# Patient Record
Sex: Male | Born: 1935
Health system: Southern US, Community
[De-identification: ages and names within clinical notes are randomized; demographics above are authoritative.]

## PROBLEM LIST (undated history)

## (undated) DIAGNOSIS — F329 Major depressive disorder, single episode, unspecified: Secondary | ICD-10-CM

## (undated) DIAGNOSIS — K635 Polyp of colon: Secondary | ICD-10-CM

## (undated) DIAGNOSIS — I10 Essential (primary) hypertension: Secondary | ICD-10-CM

## (undated) DIAGNOSIS — T7840XA Allergy, unspecified, initial encounter: Secondary | ICD-10-CM

## (undated) DIAGNOSIS — B019 Varicella without complication: Secondary | ICD-10-CM

## (undated) DIAGNOSIS — M1990 Unspecified osteoarthritis, unspecified site: Secondary | ICD-10-CM

## (undated) DIAGNOSIS — I499 Cardiac arrhythmia, unspecified: Secondary | ICD-10-CM

## (undated) DIAGNOSIS — E079 Disorder of thyroid, unspecified: Secondary | ICD-10-CM

## (undated) DIAGNOSIS — K5792 Diverticulitis of intestine, part unspecified, without perforation or abscess without bleeding: Secondary | ICD-10-CM

## (undated) DIAGNOSIS — F32A Depression, unspecified: Secondary | ICD-10-CM

## (undated) HISTORY — DX: Varicella without complication: B01.9

## (undated) HISTORY — DX: Essential (primary) hypertension: I10

## (undated) HISTORY — DX: Major depressive disorder, single episode, unspecified: F32.9

## (undated) HISTORY — DX: Depression, unspecified: F32.A

## (undated) HISTORY — DX: Unspecified osteoarthritis, unspecified site: M19.90

## (undated) HISTORY — DX: Polyp of colon: K63.5

## (undated) HISTORY — DX: Diverticulitis of intestine, part unspecified, without perforation or abscess without bleeding: K57.92

## (undated) HISTORY — DX: Disorder of thyroid, unspecified: E07.9

## (undated) HISTORY — DX: Cardiac arrhythmia, unspecified: I49.9

## (undated) HISTORY — DX: Allergy, unspecified, initial encounter: T78.40XA

---

## 2006-09-21 HISTORY — PX: TOTAL HIP ARTHROPLASTY: SHX124

## 2010-09-01 HISTORY — PX: OTHER SURGICAL HISTORY: SHX169

## 2010-09-11 HISTORY — PX: SPINE SURGERY: SHX786

## 2011-09-13 DIAGNOSIS — I10 Essential (primary) hypertension: Secondary | ICD-10-CM | POA: Diagnosis not present

## 2011-09-14 DIAGNOSIS — R39198 Other difficulties with micturition: Secondary | ICD-10-CM | POA: Diagnosis not present

## 2011-09-14 DIAGNOSIS — N35919 Unspecified urethral stricture, male, unspecified site: Secondary | ICD-10-CM | POA: Diagnosis not present

## 2011-09-14 DIAGNOSIS — N320 Bladder-neck obstruction: Secondary | ICD-10-CM | POA: Diagnosis not present

## 2011-10-05 DIAGNOSIS — M47817 Spondylosis without myelopathy or radiculopathy, lumbosacral region: Secondary | ICD-10-CM | POA: Diagnosis not present

## 2011-10-11 DIAGNOSIS — Z7982 Long term (current) use of aspirin: Secondary | ICD-10-CM | POA: Diagnosis not present

## 2011-10-11 DIAGNOSIS — E785 Hyperlipidemia, unspecified: Secondary | ICD-10-CM | POA: Diagnosis not present

## 2011-10-11 DIAGNOSIS — Z87891 Personal history of nicotine dependence: Secondary | ICD-10-CM | POA: Diagnosis not present

## 2011-10-11 DIAGNOSIS — E039 Hypothyroidism, unspecified: Secondary | ICD-10-CM | POA: Diagnosis not present

## 2011-10-11 DIAGNOSIS — M47817 Spondylosis without myelopathy or radiculopathy, lumbosacral region: Secondary | ICD-10-CM | POA: Diagnosis not present

## 2011-10-11 DIAGNOSIS — J309 Allergic rhinitis, unspecified: Secondary | ICD-10-CM | POA: Diagnosis not present

## 2011-10-11 DIAGNOSIS — I10 Essential (primary) hypertension: Secondary | ICD-10-CM | POA: Diagnosis not present

## 2011-10-20 DIAGNOSIS — Z0000 Encounter for general adult medical examination without abnormal findings: Secondary | ICD-10-CM | POA: Diagnosis not present

## 2011-10-20 DIAGNOSIS — I10 Essential (primary) hypertension: Secondary | ICD-10-CM | POA: Diagnosis not present

## 2011-10-20 DIAGNOSIS — Z1212 Encounter for screening for malignant neoplasm of rectum: Secondary | ICD-10-CM | POA: Diagnosis not present

## 2011-10-20 DIAGNOSIS — E039 Hypothyroidism, unspecified: Secondary | ICD-10-CM | POA: Diagnosis not present

## 2011-10-24 DIAGNOSIS — E039 Hypothyroidism, unspecified: Secondary | ICD-10-CM | POA: Diagnosis not present

## 2011-10-24 DIAGNOSIS — I10 Essential (primary) hypertension: Secondary | ICD-10-CM | POA: Diagnosis not present

## 2011-10-24 DIAGNOSIS — M545 Low back pain, unspecified: Secondary | ICD-10-CM | POA: Diagnosis not present

## 2011-10-24 DIAGNOSIS — Z1212 Encounter for screening for malignant neoplasm of rectum: Secondary | ICD-10-CM | POA: Diagnosis not present

## 2011-10-24 DIAGNOSIS — Z86010 Personal history of colonic polyps: Secondary | ICD-10-CM | POA: Diagnosis not present

## 2011-10-24 DIAGNOSIS — Z0000 Encounter for general adult medical examination without abnormal findings: Secondary | ICD-10-CM | POA: Diagnosis not present

## 2011-10-24 DIAGNOSIS — G479 Sleep disorder, unspecified: Secondary | ICD-10-CM | POA: Diagnosis not present

## 2011-11-21 DIAGNOSIS — R809 Proteinuria, unspecified: Secondary | ICD-10-CM | POA: Diagnosis not present

## 2011-11-21 DIAGNOSIS — R799 Abnormal finding of blood chemistry, unspecified: Secondary | ICD-10-CM | POA: Diagnosis not present

## 2011-12-07 DIAGNOSIS — M47817 Spondylosis without myelopathy or radiculopathy, lumbosacral region: Secondary | ICD-10-CM | POA: Diagnosis not present

## 2011-12-12 DIAGNOSIS — Z0000 Encounter for general adult medical examination without abnormal findings: Secondary | ICD-10-CM | POA: Diagnosis not present

## 2011-12-12 DIAGNOSIS — Z86010 Personal history of colonic polyps: Secondary | ICD-10-CM | POA: Diagnosis not present

## 2011-12-13 DIAGNOSIS — M47817 Spondylosis without myelopathy or radiculopathy, lumbosacral region: Secondary | ICD-10-CM | POA: Diagnosis not present

## 2011-12-13 DIAGNOSIS — Z87891 Personal history of nicotine dependence: Secondary | ICD-10-CM | POA: Diagnosis not present

## 2011-12-13 DIAGNOSIS — I10 Essential (primary) hypertension: Secondary | ICD-10-CM | POA: Diagnosis not present

## 2011-12-13 DIAGNOSIS — Z7982 Long term (current) use of aspirin: Secondary | ICD-10-CM | POA: Diagnosis not present

## 2011-12-13 DIAGNOSIS — E039 Hypothyroidism, unspecified: Secondary | ICD-10-CM | POA: Diagnosis not present

## 2012-01-16 DIAGNOSIS — M461 Sacroiliitis, not elsewhere classified: Secondary | ICD-10-CM | POA: Diagnosis not present

## 2012-01-16 DIAGNOSIS — M47817 Spondylosis without myelopathy or radiculopathy, lumbosacral region: Secondary | ICD-10-CM | POA: Diagnosis not present

## 2012-03-05 DIAGNOSIS — Z23 Encounter for immunization: Secondary | ICD-10-CM | POA: Diagnosis not present

## 2012-03-15 DIAGNOSIS — I10 Essential (primary) hypertension: Secondary | ICD-10-CM | POA: Diagnosis not present

## 2012-04-13 DIAGNOSIS — T169XXA Foreign body in ear, unspecified ear, initial encounter: Secondary | ICD-10-CM | POA: Diagnosis not present

## 2012-10-14 DIAGNOSIS — M545 Low back pain: Secondary | ICD-10-CM | POA: Diagnosis not present

## 2012-10-14 DIAGNOSIS — H43819 Vitreous degeneration, unspecified eye: Secondary | ICD-10-CM | POA: Diagnosis not present

## 2012-10-31 DIAGNOSIS — R350 Frequency of micturition: Secondary | ICD-10-CM | POA: Diagnosis not present

## 2012-10-31 DIAGNOSIS — N401 Enlarged prostate with lower urinary tract symptoms: Secondary | ICD-10-CM | POA: Diagnosis not present

## 2012-10-31 DIAGNOSIS — R39198 Other difficulties with micturition: Secondary | ICD-10-CM | POA: Diagnosis not present

## 2012-10-31 DIAGNOSIS — R351 Nocturia: Secondary | ICD-10-CM | POA: Diagnosis not present

## 2012-11-07 DIAGNOSIS — J3089 Other allergic rhinitis: Secondary | ICD-10-CM | POA: Diagnosis not present

## 2012-11-08 DIAGNOSIS — I10 Essential (primary) hypertension: Secondary | ICD-10-CM | POA: Diagnosis not present

## 2012-12-25 DIAGNOSIS — N138 Other obstructive and reflux uropathy: Secondary | ICD-10-CM | POA: Diagnosis not present

## 2012-12-25 DIAGNOSIS — R350 Frequency of micturition: Secondary | ICD-10-CM | POA: Diagnosis not present

## 2012-12-25 DIAGNOSIS — R39198 Other difficulties with micturition: Secondary | ICD-10-CM | POA: Diagnosis not present

## 2012-12-25 DIAGNOSIS — N401 Enlarged prostate with lower urinary tract symptoms: Secondary | ICD-10-CM | POA: Diagnosis not present

## 2012-12-25 DIAGNOSIS — R3919 Other difficulties with micturition: Secondary | ICD-10-CM | POA: Diagnosis not present

## 2012-12-25 DIAGNOSIS — D400 Neoplasm of uncertain behavior of prostate: Secondary | ICD-10-CM | POA: Diagnosis not present

## 2013-01-23 DIAGNOSIS — Z1389 Encounter for screening for other disorder: Secondary | ICD-10-CM | POA: Diagnosis not present

## 2013-01-23 DIAGNOSIS — N138 Other obstructive and reflux uropathy: Secondary | ICD-10-CM | POA: Diagnosis not present

## 2013-01-23 DIAGNOSIS — N401 Enlarged prostate with lower urinary tract symptoms: Secondary | ICD-10-CM | POA: Diagnosis not present

## 2013-01-23 DIAGNOSIS — I10 Essential (primary) hypertension: Secondary | ICD-10-CM | POA: Diagnosis not present

## 2013-01-23 DIAGNOSIS — Z01810 Encounter for preprocedural cardiovascular examination: Secondary | ICD-10-CM | POA: Diagnosis not present

## 2013-01-23 DIAGNOSIS — Z01818 Encounter for other preprocedural examination: Secondary | ICD-10-CM | POA: Diagnosis not present

## 2013-01-28 DIAGNOSIS — M545 Low back pain: Secondary | ICD-10-CM | POA: Diagnosis not present

## 2013-01-28 DIAGNOSIS — R32 Unspecified urinary incontinence: Secondary | ICD-10-CM | POA: Diagnosis not present

## 2013-01-28 DIAGNOSIS — Z87891 Personal history of nicotine dependence: Secondary | ICD-10-CM | POA: Diagnosis not present

## 2013-01-28 DIAGNOSIS — Z7982 Long term (current) use of aspirin: Secondary | ICD-10-CM | POA: Diagnosis not present

## 2013-01-28 DIAGNOSIS — N401 Enlarged prostate with lower urinary tract symptoms: Secondary | ICD-10-CM | POA: Diagnosis not present

## 2013-01-28 DIAGNOSIS — E039 Hypothyroidism, unspecified: Secondary | ICD-10-CM | POA: Diagnosis not present

## 2013-01-28 DIAGNOSIS — I10 Essential (primary) hypertension: Secondary | ICD-10-CM | POA: Diagnosis not present

## 2013-01-28 HISTORY — PX: PROSTATE SURGERY: SHX751

## 2013-02-06 DIAGNOSIS — Z0000 Encounter for general adult medical examination without abnormal findings: Secondary | ICD-10-CM | POA: Diagnosis not present

## 2013-02-06 DIAGNOSIS — Z23 Encounter for immunization: Secondary | ICD-10-CM | POA: Diagnosis not present

## 2013-02-06 DIAGNOSIS — K5732 Diverticulitis of large intestine without perforation or abscess without bleeding: Secondary | ICD-10-CM | POA: Diagnosis not present

## 2013-02-06 DIAGNOSIS — N138 Other obstructive and reflux uropathy: Secondary | ICD-10-CM | POA: Diagnosis not present

## 2013-02-06 DIAGNOSIS — I10 Essential (primary) hypertension: Secondary | ICD-10-CM | POA: Diagnosis not present

## 2013-02-06 DIAGNOSIS — E785 Hyperlipidemia, unspecified: Secondary | ICD-10-CM | POA: Diagnosis not present

## 2013-02-06 DIAGNOSIS — E039 Hypothyroidism, unspecified: Secondary | ICD-10-CM | POA: Diagnosis not present

## 2013-02-06 DIAGNOSIS — M47812 Spondylosis without myelopathy or radiculopathy, cervical region: Secondary | ICD-10-CM | POA: Diagnosis not present

## 2013-06-03 ENCOUNTER — Other Ambulatory Visit: Payer: Self-pay | Admitting: *Deleted

## 2013-06-03 ENCOUNTER — Ambulatory Visit (INDEPENDENT_AMBULATORY_CARE_PROVIDER_SITE_OTHER): Payer: Medicare Other | Admitting: Family Medicine

## 2013-06-03 ENCOUNTER — Encounter: Payer: Self-pay | Admitting: Family Medicine

## 2013-06-03 VITALS — BP 110/64 | Temp 98.2°F | Ht 64.25 in | Wt 176.0 lb

## 2013-06-03 DIAGNOSIS — N400 Enlarged prostate without lower urinary tract symptoms: Secondary | ICD-10-CM | POA: Insufficient documentation

## 2013-06-03 DIAGNOSIS — E039 Hypothyroidism, unspecified: Secondary | ICD-10-CM

## 2013-06-03 DIAGNOSIS — G8929 Other chronic pain: Secondary | ICD-10-CM | POA: Diagnosis not present

## 2013-06-03 DIAGNOSIS — M5416 Radiculopathy, lumbar region: Principal | ICD-10-CM

## 2013-06-03 DIAGNOSIS — I10 Essential (primary) hypertension: Secondary | ICD-10-CM | POA: Diagnosis not present

## 2013-06-03 DIAGNOSIS — G4700 Insomnia, unspecified: Secondary | ICD-10-CM

## 2013-06-03 DIAGNOSIS — IMO0002 Reserved for concepts with insufficient information to code with codable children: Secondary | ICD-10-CM | POA: Diagnosis not present

## 2013-06-03 LAB — TSH: TSH: 0.1 u[IU]/mL — ABNORMAL LOW (ref 0.35–5.50)

## 2013-06-03 MED ORDER — LEVOTHYROXINE SODIUM 175 MCG PO TABS: 175.0000 ug | ORAL_TABLET | Freq: Every day | ORAL | Status: DC

## 2013-06-03 MED ORDER — TAMSULOSIN HCL 0.4 MG PO CAPS: ORAL_CAPSULE | ORAL | Status: DC

## 2013-06-03 MED ORDER — VERAPAMIL HCL 120 MG PO TABS: ORAL_TABLET | ORAL | Status: DC

## 2013-06-03 MED ORDER — TRIAMTERENE-HCTZ 37.5-25 MG PO TABS: ORAL_TABLET | ORAL | Status: DC

## 2013-06-03 MED ORDER — HYDROCODONE-ACETAMINOPHEN 5-325 MG PO TABS: ORAL_TABLET | ORAL | Status: DC

## 2013-06-03 MED ORDER — ZOLPIDEM TARTRATE 5 MG PO TABS: ORAL_TABLET | ORAL | Status: DC

## 2013-06-03 NOTE — Patient Instructions (Signed)
Ambien 5 mg.......... one half tab at bedtime when necessary for sleep  Decrease the daily and to 120 mg daily  Check your blood pressure daily at home  Return in one month for followup with all your blood pressure readings and the device  Vicodin........ one half tab in the morning and a full tab at bedtime  Joining the [employer] and begin a water walking program  Continue the Synthroid Maxide and Flomax as you currently are doing.  We would also be happy to set you up a consult in the pain clinic with Dr. Nicholaus Bloom to see if there is anything else that can be done to help relieve your pain

## 2013-06-03 NOTE — Progress Notes (Signed)
   Subjective:    Patient ID: Ronald Strong, male    DOB: 1921-02-11, 80 y.o.   MRN: [id number]  HPI Ronald Strong is a 80 year old married male nonsmoker retired Social research officer, government from [place] who stays here in the winter with his daughter Ronald Strong who comes in today to establish since his can be living here 6 months of the year  He takes Synthroid one daily for hypothyroidism, Maxide verapamil for hypertension BP actually too low 110/64, Ambien 5 mg each bedtime for sleep, Flomax 0.4 for BPH, and Vicodin 5 mg one at bedtime for chronic back pain. He's had spinal surgery in the past and has been told he cannot have any more operations. He states he's unable to walk because of the pain. Is able to sleep at night because he takes a pain pill at bedtime but is not take any pain medication during the day.  He's always been very healthy. He had a right hip replacement in 2008, spinal surgery 2012, and laser reduction of the prostate in September 2014.   Review of Systems Review of systems otherwise negative except for exercise is limited by pain    Objective:   Physical Exam  Well-developed well-nourished male in no acute distress vital signs stable is afebrile BP today is low 110/64      Assessment & Plan:  Chronic back pain,,,,,,,, referred to the pain clinic for further evaluation,,,,,,,, take a half a Vicodin prior to exercise in the morning  Hypothyroidism continue Synthroid  Hypertension...Marland KitchenMarland KitchenMarland Kitchen BP too low cut breath no down to 120 mg daily  BPH Flomax 0.4 daily  Status post prostatectomy via laser  Status post right hip replacement  Status post spinal decompression

## 2013-06-04 ENCOUNTER — Telehealth: Payer: Self-pay | Admitting: Family Medicine

## 2013-06-04 NOTE — Telephone Encounter (Signed)
Relevant patient education assigned to patient using Emmi. ° °

## 2013-06-05 ENCOUNTER — Other Ambulatory Visit: Payer: Self-pay | Admitting: *Deleted

## 2013-06-05 DIAGNOSIS — E039 Hypothyroidism, unspecified: Secondary | ICD-10-CM

## 2013-06-05 MED ORDER — LEVOTHYROXINE SODIUM 125 MCG PO TABS: 125.0000 ug | ORAL_TABLET | Freq: Every day | ORAL | Status: DC

## 2013-07-01 ENCOUNTER — Ambulatory Visit (INDEPENDENT_AMBULATORY_CARE_PROVIDER_SITE_OTHER): Payer: Medicare Other | Admitting: Family Medicine

## 2013-07-01 ENCOUNTER — Encounter: Payer: Self-pay | Admitting: Family Medicine

## 2013-07-01 VITALS — BP 110/68 | Temp 97.6°F | Wt 176.0 lb

## 2013-07-01 DIAGNOSIS — I10 Essential (primary) hypertension: Secondary | ICD-10-CM | POA: Diagnosis not present

## 2013-07-01 MED ORDER — VERAPAMIL HCL 80 MG PO TABS: ORAL_TABLET | ORAL | Status: DC

## 2013-07-01 NOTE — Patient Instructions (Signed)
Decrease your verapamil to 80 mg daily ........... try taking it at bedtime to avoid the episodes of low blood pressure  Check your blood pressure daily in the morning  Return in one month for followup sooner if any problem

## 2013-07-01 NOTE — Progress Notes (Signed)
   Subjective:    Patient ID: Ronald Strong, male    DOB: 07/25/1920, 80 y.o.   MRN: [id number]  HPI Ronald Strong is a delightful 80 year old male who comes in today for followup of hypertension  He was on Verapamil SR we decreased the dose to 120 mg daily because his blood pressure was too low he was lightheaded when he stood up.. 120 mg of verapamil daily his BP is still low 110/68 pulse 70 and regular he was also given Maxide 25 mg to take daily because of the fluid retention from the verapamil.  He says he is no longer lightheaded when he stands up   Review of Systems    review of systems otherwise negative Objective:   Physical Exam Well-developed well-nourished male no acute distress vital signs stable he is afebrile BP 110/68 pulse 70 and regular       Assessment & Plan:  Hypertension BP still too low decrease Verapamil SR outlined

## 2013-07-02 ENCOUNTER — Telehealth: Payer: Self-pay | Admitting: Family Medicine

## 2013-07-02 NOTE — Telephone Encounter (Signed)
Relevant patient education assigned to patient using Emmi. ° °

## 2013-07-11 ENCOUNTER — Telehealth: Payer: Self-pay | Admitting: Family Medicine

## 2013-07-11 DIAGNOSIS — G8929 Other chronic pain: Secondary | ICD-10-CM

## 2013-07-11 DIAGNOSIS — M5416 Radiculopathy, lumbar region: Principal | ICD-10-CM

## 2013-07-11 DIAGNOSIS — G4700 Insomnia, unspecified: Secondary | ICD-10-CM

## 2013-07-11 NOTE — Telephone Encounter (Signed)
Pt requesting nurse to call him back regarding two of his prescriptions pt stated he did not want to get into details with scheduler.

## 2013-07-14 ENCOUNTER — Other Ambulatory Visit (INDEPENDENT_AMBULATORY_CARE_PROVIDER_SITE_OTHER): Payer: Medicare Other

## 2013-07-14 DIAGNOSIS — E039 Hypothyroidism, unspecified: Secondary | ICD-10-CM

## 2013-07-14 LAB — TSH: TSH: 2 u[IU]/mL (ref 0.35–5.50)

## 2013-07-14 MED ORDER — ZOLPIDEM TARTRATE 5 MG PO TABS: ORAL_TABLET | ORAL | Status: DC

## 2013-07-14 MED ORDER — HYDROCODONE-ACETAMINOPHEN 5-325 MG PO TABS: ORAL_TABLET | ORAL | Status: DC

## 2013-07-15 ENCOUNTER — Other Ambulatory Visit: Payer: No Typology Code available for payment source

## 2013-08-05 ENCOUNTER — Encounter: Payer: Self-pay | Admitting: Family Medicine

## 2013-08-05 ENCOUNTER — Ambulatory Visit (INDEPENDENT_AMBULATORY_CARE_PROVIDER_SITE_OTHER): Payer: Medicare Other | Admitting: Family Medicine

## 2013-08-05 VITALS — BP 130/64 | HR 88 | Temp 98.4°F | Ht 64.25 in | Wt 175.0 lb

## 2013-08-05 DIAGNOSIS — R000 Tachycardia, unspecified: Secondary | ICD-10-CM

## 2013-08-05 NOTE — Progress Notes (Signed)
   Subjective:    Patient ID: Ronald Strong, male    DOB: 05-01-1921, 80 y.o.   MRN: [id number]  HPI Ronald Strong is a 80 year old male married nonsmoker who comes in today for evaluation of elevated heart rate  He's followed at the [facility] clinic and is here in the wintertime with his daughter  They had placed him on verapamil 120 mg in the morning and 80 mg at bedtime to control episodes of PAT. He says most recently his heart rate is been in the 90s. In reviewing his medication he's not sure she's taking his evening dose of verapamil.  He's also taking sedating antihistamine however it says he makes him hyper and he chases that with 5 mg of Ambien. I don't think at his age of 62 that he should be taking Ambien   Review of Systems Review of systems otherwise negative    Objective:   Physical Exam  Well-developed well nourished male no acute distress vital signs stable is afebrile BP today 130/64 pulse is 70 and regular      Assessment & Plan:  History of PAT currently asymptomatic with normal heart rate..........Marland Kitchen plan see orders

## 2013-08-05 NOTE — Progress Notes (Signed)
Pre visit review using our clinic review tool, if applicable. No additional management support is needed unless otherwise documented below in the visit note. 

## 2013-08-05 NOTE — Patient Instructions (Signed)
We will call you and an arch review your medications  I would  take plain Claritin 10 mg in the morning. Stop the Zyrtec and the Ambien

## 2013-08-25 ENCOUNTER — Ambulatory Visit (INDEPENDENT_AMBULATORY_CARE_PROVIDER_SITE_OTHER): Payer: Medicare Other | Admitting: Family Medicine

## 2013-08-25 ENCOUNTER — Encounter: Payer: Self-pay | Admitting: Family Medicine

## 2013-08-25 VITALS — BP 130/78 | Temp 98.5°F | Wt 176.0 lb

## 2013-08-25 DIAGNOSIS — R000 Tachycardia, unspecified: Secondary | ICD-10-CM

## 2013-08-25 NOTE — Progress Notes (Signed)
   Subjective:    Patient ID: Ronald Strong, male    DOB: [birth year], 79 y.o.   MRN: [id number]  HPI Ronald Strong is a 80 year old male who comes in today for followup of hypertension and rapid heart rate  He had been on 120 mg of verapamil the morning and 70 mg at bedtime however his blood pressure was too low and he was lightheaded when he stood up. We therefore discontinued the nighttime dose. He's currently on 120 mg in the morning. BP 130/80 no more spells of lightheadedness with change in position pulse 80 and regular no episodes of rapid heart rate   Review of Systems Negative    Objective:   Physical Exam  Well-developed well-nourished male no acute distress vital signs stable is afebrile BP 130/80 pulse 80 regular      Assessment & Plan:  Hypertension and rapid heart rate episodic,,,,,,,,,

## 2013-08-25 NOTE — Patient Instructions (Signed)
Continue current medications  Follow-up when necessary 

## 2013-08-25 NOTE — Progress Notes (Signed)
Pre visit review using our clinic review tool, if applicable. No additional management support is needed unless otherwise documented below in the visit note. 

## 2013-09-15 ENCOUNTER — Other Ambulatory Visit: Payer: Self-pay | Admitting: *Deleted

## 2013-09-15 DIAGNOSIS — M5416 Radiculopathy, lumbar region: Principal | ICD-10-CM

## 2013-09-15 DIAGNOSIS — G8929 Other chronic pain: Secondary | ICD-10-CM

## 2013-09-15 MED ORDER — HYDROCODONE-ACETAMINOPHEN 5-325 MG PO TABS: ORAL_TABLET | ORAL | Status: DC

## 2013-09-19 DIAGNOSIS — I10 Essential (primary) hypertension: Secondary | ICD-10-CM | POA: Diagnosis not present

## 2013-09-29 ENCOUNTER — Other Ambulatory Visit: Payer: Self-pay | Admitting: Family Medicine

## 2013-09-29 DIAGNOSIS — M5416 Radiculopathy, lumbar region: Principal | ICD-10-CM

## 2013-09-29 DIAGNOSIS — G8929 Other chronic pain: Secondary | ICD-10-CM

## 2013-10-13 ENCOUNTER — Encounter: Payer: Self-pay | Admitting: Physical Medicine & Rehabilitation

## 2013-10-13 ENCOUNTER — Other Ambulatory Visit: Payer: Self-pay | Admitting: Family Medicine

## 2013-12-01 ENCOUNTER — Other Ambulatory Visit: Payer: Self-pay | Admitting: Family Medicine

## 2013-12-03 ENCOUNTER — Telehealth: Payer: Self-pay | Admitting: Family Medicine

## 2013-12-03 DIAGNOSIS — G8929 Other chronic pain: Secondary | ICD-10-CM

## 2013-12-03 DIAGNOSIS — M5416 Radiculopathy, lumbar region: Principal | ICD-10-CM

## 2013-12-03 NOTE — Telephone Encounter (Signed)
Pt request refill of the following: HYDROcodone-acetaminophen (NORCO/VICODIN) 5-325 MG per tablet ° ° °Phamacy: °

## 2013-12-04 MED ORDER — HYDROCODONE-ACETAMINOPHEN 5-325 MG PO TABS: ORAL_TABLET | ORAL | Status: DC

## 2013-12-04 NOTE — Telephone Encounter (Signed)
Rx ready for pick up and patient is aware 

## 2013-12-08 ENCOUNTER — Ambulatory Visit: Payer: Medicare Other | Admitting: Physical Medicine & Rehabilitation

## 2013-12-08 ENCOUNTER — Encounter: Payer: Medicare Other | Attending: Physical Medicine & Rehabilitation

## 2013-12-16 ENCOUNTER — Ambulatory Visit (INDEPENDENT_AMBULATORY_CARE_PROVIDER_SITE_OTHER): Payer: Medicare Other | Admitting: Family Medicine

## 2013-12-16 ENCOUNTER — Encounter: Payer: Self-pay | Admitting: Family Medicine

## 2013-12-16 VITALS — BP 120/70 | HR 86 | Temp 98.8°F

## 2013-12-16 DIAGNOSIS — B9789 Other viral agents as the cause of diseases classified elsewhere: Principal | ICD-10-CM

## 2013-12-16 DIAGNOSIS — J069 Acute upper respiratory infection, unspecified: Secondary | ICD-10-CM | POA: Diagnosis not present

## 2013-12-16 MED ORDER — HYDROCODONE-HOMATROPINE 5-1.5 MG/5ML PO SYRP: 5.0000 mL | ORAL_SOLUTION | Freq: Three times a day (TID) | ORAL | Status: DC | PRN

## 2013-12-16 NOTE — Progress Notes (Signed)
   Subjective:    Patient ID: Ronald Strong, male    DOB: [birth year], 80 y.o.   MRN: [id number]  HPI [name] is a 80 year old male who comes in today with a seven-day history of a cold  He said head congestion sore throat and cough. No fever chills review of systems otherwise negative.  He says he is prone to pneumonia   Review of Systems    review of systems negative Objective:   Physical Exam  Well-developed and nourished male no acute distress vital signs stable he is afebrile HEENT negative neck was supple no adenopathy lungs are clear      Assessment & Plan:

## 2013-12-16 NOTE — Progress Notes (Signed)
Pre visit review using our clinic review tool, if applicable. No additional management support is needed unless otherwise documented below in the visit note. 

## 2013-12-16 NOTE — Patient Instructions (Signed)
Drink lots of water  Hydromet 1/2-1 teaspoon twice daily. For cough  If you begin to run fever call immediately

## 2013-12-17 ENCOUNTER — Other Ambulatory Visit: Payer: Self-pay | Admitting: Family Medicine

## 2013-12-31 ENCOUNTER — Other Ambulatory Visit: Payer: Self-pay | Admitting: Family Medicine

## 2014-01-09 ENCOUNTER — Encounter: Payer: Medicare Other | Attending: Physical Medicine & Rehabilitation

## 2014-01-09 ENCOUNTER — Encounter: Payer: Self-pay | Admitting: Physical Medicine & Rehabilitation

## 2014-01-09 ENCOUNTER — Ambulatory Visit (HOSPITAL_BASED_OUTPATIENT_CLINIC_OR_DEPARTMENT_OTHER): Payer: Medicare Other | Admitting: Physical Medicine & Rehabilitation

## 2014-01-09 VITALS — BP 125/63 | HR 93 | Resp 14 | Ht 65.0 in | Wt 171.0 lb

## 2014-01-09 DIAGNOSIS — M545 Low back pain, unspecified: Secondary | ICD-10-CM | POA: Diagnosis present

## 2014-01-09 DIAGNOSIS — I10 Essential (primary) hypertension: Secondary | ICD-10-CM | POA: Diagnosis not present

## 2014-01-09 DIAGNOSIS — M961 Postlaminectomy syndrome, not elsewhere classified: Secondary | ICD-10-CM | POA: Insufficient documentation

## 2014-01-09 DIAGNOSIS — M4150 Other secondary scoliosis, site unspecified: Secondary | ICD-10-CM | POA: Diagnosis not present

## 2014-01-09 DIAGNOSIS — M4130 Thoracogenic scoliosis, site unspecified: Secondary | ICD-10-CM | POA: Diagnosis not present

## 2014-01-09 DIAGNOSIS — M4155 Other secondary scoliosis, thoracolumbar region: Secondary | ICD-10-CM

## 2014-01-09 MED ORDER — TRAMADOL HCL 50 MG PO TABS: 50.0000 mg | ORAL_TABLET | Freq: Two times a day (BID) | ORAL | Status: DC

## 2014-01-09 NOTE — Progress Notes (Signed)
Subjective:    Patient ID: Ronald Strong, male    DOB: 02-20-1921, 80 y.o.   MRN: [id number]  HPI CC Low back pain (almost forever) PCP advised pt to be evaluated  Low back pain increasing over the last year. History of lumbar decompression 12/13/2011, ?L4-5, has been on hydrocodone since the surgery. Just takes 1 tablet at bedtime  Surgery help for approximately 5 months. Pain then returned. Tried lumbar injections which were not helpful and also tried radio frequency ablation which was not helpful. Had acupuncture in [place] which seemed to be helpful but tried some in [place] which was not   PSH:  In addition to above, BPH treated with laser debridement in 01/28/2013    Pain Inventory Average Pain 8 Pain Right Now 8 My pain is sharp, burning and aching  In the last 24 hours, has pain interfered with the following? General activity 8 Relation with others 7 Enjoyment of life 9 What TIME of day is your pain at its worst? all Sleep (in general) Fair  Pain is worse with: walking, bending and standing Pain improves with: rest, heat/ice and medication Relief from Meds: 6  Mobility walk without assistance how many minutes can you walk? 20 ability to climb steps?  yes do you drive?  yes  Function retired Do you have any goals in this area?  yes  Neuro/Psych numbness trouble walking  Prior Studies Any changes since last visit?  no  Physicians involved in your care Any changes since last visit?  no Primary care Stevie Kern   Family History  Problem Relation Age of Onset  . Arthritis Other   . Heart disease Other   . Hypertension Other    History   Social History  . Marital Status: Married    Spouse Name: N/A    Number of Children: N/A  . Years of Education: N/A   Social History Main Topics  . Smoking status: Former Smoker    Quit date: 06/04/1983  . Smokeless tobacco: None  . Alcohol Use: Yes  . Drug Use: No  . Sexual Activity: None   Other  Topics Concern  . None   Social History Narrative  . None   Past Surgical History  Procedure Laterality Date  . Total hip arthroplasty  09/21/06    right  . Other surgical history  09/01/10    spine decompression  . Spine surgery  09/11/10  . Prostate surgery  01/28/13    laser   Past Medical History  Diagnosis Date  . Arthritis   . Depression   . Chicken pox   . Diverticulitis   . Allergy   . Arrhythmia   . Hypertension   . Colon polyps   . Thyroid disease    BP 125/63  Pulse 93  Resp 14  Ht 5\' 5"  (1.651 m)  Wt 171 lb (77.565 kg)  BMI 28.46 kg/m2  SpO2 96%  Opioid Risk Score: 0 Fall Risk Score: Moderate Fall Risk (6-13 points) (educated and given handout on fall prevention in the home) Review of Systems  Musculoskeletal: Positive for gait problem.  Neurological: Positive for numbness.  All other systems reviewed and are negative.      Objective:   Physical Exam  Nursing note and vitals reviewed. Constitutional: He is oriented to person, place, and time. He appears well-developed and well-nourished.  HENT:  Head: Normocephalic and atraumatic.  Eyes: Conjunctivae and EOM are normal. Pupils are equal, round, and reactive to light.  Neurological: He is alert and oriented to person, place, and time. He has normal strength and normal reflexes. Gait abnormal.  Kyphotic gait  Psychiatric: He has a normal mood and affect.    Kyphosis Prominent L2 and L3 spinous process Right convex scoliosis lower thoracic and upper lumbar Lumbar range of motion limited less than 25% extension lateral bending and rotation approximately 25-50% flexion 5/5 strength bilateral deltoid, bicep, tricep, grip, hip flexor, knee extensors, ankle dorsiflexor and plantar flex     Assessment & Plan:  1. Lumbar postlaminectomy syndrome with thoracolumbar degenerative scoliosis Has been fairly well controlled under low dose hydrocodone. He may actually be able to obtain adequate relief from  tramadol twice a day. We'll try this. If this is not helpful move up to Tylenol 3. No need for spine injection at the current time  We discussed physical therapy which she does not wish to pursue. Has had this in the past. We discussed exercise program, recommend stationary bicycling which he would consider

## 2014-01-09 NOTE — Patient Instructions (Signed)
If tramadol is not helpful by itself he may take the hydrocodone at night

## 2014-02-11 ENCOUNTER — Ambulatory Visit (INDEPENDENT_AMBULATORY_CARE_PROVIDER_SITE_OTHER): Payer: Medicare Other

## 2014-02-11 DIAGNOSIS — Z23 Encounter for immunization: Secondary | ICD-10-CM | POA: Diagnosis not present

## 2014-02-16 ENCOUNTER — Encounter: Payer: Self-pay | Admitting: Physical Medicine & Rehabilitation

## 2014-02-16 ENCOUNTER — Ambulatory Visit (HOSPITAL_BASED_OUTPATIENT_CLINIC_OR_DEPARTMENT_OTHER): Payer: Medicare Other | Admitting: Physical Medicine & Rehabilitation

## 2014-02-16 ENCOUNTER — Encounter: Payer: Medicare Other | Attending: Physical Medicine & Rehabilitation

## 2014-02-16 VITALS — BP 107/61 | HR 87 | Resp 14 | Wt 168.4 lb

## 2014-02-16 DIAGNOSIS — M961 Postlaminectomy syndrome, not elsewhere classified: Secondary | ICD-10-CM | POA: Diagnosis not present

## 2014-02-16 MED ORDER — TRAMADOL HCL 50 MG PO TABS: 50.0000 mg | ORAL_TABLET | Freq: Three times a day (TID) | ORAL | Status: DC

## 2014-02-16 NOTE — Patient Instructions (Addendum)
[facility] Address: [address], [place], [place] [address] [phone] Hours: Open today  8:00 am - 8:00 pm 

## 2014-02-16 NOTE — Progress Notes (Signed)
Subjective:    Patient ID: Ronald Strong, male    DOB: 09/05/1920, 80 y.o.   MRN: [id number]  HPI CC  Low back pain  Takes tramadol  Sleeps better with tramadol but awakens twice at night  Feels like around noon or afternoon tramadol worse off. Inquiring about a third tablet.  Patient with previous good results with acupuncture. Had better results in [place] been here in [place]. Looking for another potential referral to acupuncture.  Patient without new issues. Only occasionally has pain down the right leg but this is infrequent. Questions about spinal cord stimulation. We did discuss this as being mainly helpful for radicular type pain.  Pain Inventory Average Pain 6 Pain Right Now 9 My pain is sharp and burning  In the last 24 hours, has pain interfered with the following? General activity 9 Relation with others 9 Enjoyment of life 10 What TIME of day is your pain at its worst? daytime and evening Sleep (in general) Fair  Pain is worse with: walking, bending and standing Pain improves with: rest and medication Relief from Meds: 6  Mobility walk without assistance how many minutes can you walk? 5 ability to climb steps?  yes do you drive?  yes  Function retired  Neuro/Psych trouble walking depression  Prior Studies Any changes since last visit?  no  Physicians involved in your care Any changes since last visit?  no   Family History  Problem Relation Age of Onset  . Arthritis Other   . Heart disease Other   . Hypertension Other    History   Social History  . Marital Status: Married    Spouse Name: N/A    Number of Children: N/A  . Years of Education: N/A   Social History Main Topics  . Smoking status: Former Smoker    Quit date: 06/04/1983  . Smokeless tobacco: None  . Alcohol Use: Yes  . Drug Use: No  . Sexual Activity: None   Other Topics Concern  . None   Social History Narrative  . None   Past Surgical History  Procedure  Laterality Date  . Total hip arthroplasty  09/21/06    right  . Other surgical history  09/01/10    spine decompression  . Spine surgery  09/11/10  . Prostate surgery  01/28/13    laser   Past Medical History  Diagnosis Date  . Arthritis   . Depression   . Chicken pox   . Diverticulitis   . Allergy   . Arrhythmia   . Hypertension   . Colon polyps   . Thyroid disease    BP 107/61  Pulse 87  Resp 14  Wt 168 lb 6.4 oz (76.386 kg)  SpO2 93%  Opioid Risk Score:   Fall Risk Score: Moderate Fall Risk (6-13 points) (previously educated and given handout)  Review of Systems  Musculoskeletal: Positive for gait problem.  Psychiatric/Behavioral: Positive for dysphoric mood.  All other systems reviewed and are negative.      Objective:   Physical Exam  Nursing note and vitals reviewed. Constitutional: He is oriented to person, place, and time. He appears well-developed and well-nourished.  Musculoskeletal:       Lumbar back: He exhibits no swelling and no spasm.  No pain with lumbar range of motion except with extension. No tenderness to palpation in the lumbar paraspinal muscles. Kyphotic deformity  Neurological: He is alert and oriented to person, place, and time.  Psychiatric: He has a  normal mood and affect.   Motor strength is 5/5 bilateral hip flexor knee extensor ankle dorsiflexor and plantar flexor       Assessment & Plan:   1. Lumbar postlaminectomy syndrome status post lumbar decompression 12/13/2011. Chronic postoperative pain mainly axial which did not respond to medial branch blocks or radiofrequency Previous good results with acupuncture Referral to: [facility] Address: [address], [place], [place] [address] [phone]  Benefiting from tramadol as well. No sig side effects except some urinary frequency

## 2014-02-24 ENCOUNTER — Ambulatory Visit: Payer: Medicare Other | Admitting: Physical Medicine & Rehabilitation

## 2014-03-23 ENCOUNTER — Ambulatory Visit: Payer: Medicare Other

## 2014-03-31 ENCOUNTER — Telehealth: Payer: Self-pay | Admitting: *Deleted

## 2014-03-31 ENCOUNTER — Ambulatory Visit: Payer: Medicare Other | Attending: Physical Medicine & Rehabilitation | Admitting: Physical Therapy

## 2014-03-31 DIAGNOSIS — M545 Low back pain, unspecified: Secondary | ICD-10-CM

## 2014-03-31 DIAGNOSIS — R269 Unspecified abnormalities of gait and mobility: Secondary | ICD-10-CM | POA: Insufficient documentation

## 2014-03-31 NOTE — Telephone Encounter (Signed)
appts made and printed...td 

## 2014-03-31 NOTE — Therapy (Signed)
Physical Therapy Evaluation  Patient Details  Name: Ronald Strong MRN: [id number] Date of Birth: February 15, 1921  Encounter Date: 03/31/2014      PT End of Session - 03/31/14 1411    Visit Number 1   Number of Visits 8   Date for PT Re-Evaluation 05/30/14   PT Start Time 1330   PT Stop Time 1405   PT Time Calculation (min) 35 min   Activity Tolerance Patient tolerated treatment well   Behavior During Therapy [facility] for tasks assessed/performed      Past Medical History  Diagnosis Date  . Arthritis   . Depression   . Chicken pox   . Diverticulitis   . Allergy   . Arrhythmia   . Hypertension   . Colon polyps   . Thyroid disease     Past Surgical History  Procedure Laterality Date  . Total hip arthroplasty  09/21/06    right  . Other surgical history  09/01/10    spine decompression  . Spine surgery  09/11/10  . Prostate surgery  01/28/13    laser    There were no vitals taken for this visit.  Visit Diagnosis:  Bilateral low back pain without sciatica - Plan: PT plan of care cert/re-cert  Abnormality of gait - Plan: PT plan of care cert/re-cert      Subjective Assessment - 03/31/14 1334    Symptoms Pt is a 80 y/o male who presents to OPPT s/p lumbar decompression about 4 years ago.  Pt reports pain improved initially however returned and reports difficulty walking erect.     Pertinent History lumbar decompression   Limitations Walking   How long can you walk comfortably? 30 yards   Patient Stated Goals walk standing up straight, improve mobility   Currently in Pain? Yes  currently none sitting   Pain Score 9    Pain Location Back   Pain Orientation Lower;Right;Left   Pain Descriptors / Indicators Sharp;Aching   Pain Type Chronic pain   Pain Onset More than a month ago   Pain Frequency Intermittent   Aggravating Factors  walking   Pain Relieving Factors lying down          [facility] PT Assessment - 03/31/14 1337    Assessment   Medical Diagnosis low back pain   Onset Date 03/31/10   Next MD Visit Jan 2016   Prior Therapy PT, accupuncture (pt reports accupuncture at [facility] provided a "few hours" relief.   Precautions   Precautions None   Restrictions   Weight Bearing Restrictions No   Balance Screen   Has the patient fallen in the past 6 months No   Has the patient had a decrease in activity level because of a fear of falling?  Yes   Is the patient reluctant to leave their home because of a fear of falling?  No   Home Environment   Living Enviornment Private residence   Living Arrangements Spouse/significant other;Children   Available Help at Discharge Family;Available 24 hours/day   Type of Home House   Home Access Stairs to enter   Entrance Stairs-Number of Steps 5   Entrance Stairs-Rails Can reach both   Home Layout Two level;Bed/bath upstairs   Alternate Level Stairs-Number of Steps 20   Alternate Level Stairs-Rails Left   Prior Function   Level of Independence Independent with gait;Independent with transfers;Independent with basic ADLs   Vocation Retired   Electronics engineer, some cooking, housework   Cognition   Overall  Cognitive Status Within Functional Limits for tasks assessed   Posture/Postural Control   Posture/Postural Control Postural limitations   Postural Limitations Rounded Shoulders;Forward head;Decreased lumbar lordosis;Increased thoracic kyphosis;Right pelvic obliquity;Flexed trunk   AROM   Lumbar Flexion 40   Lumbar Extension 8   Lumbar - Right Side Bend 15   Lumbar - Left Side Bend 12   Strength   Overall Strength Comments suspect hip ext weakness due to decreased hip ext with gait   Right Hip Flexion 3+/5   Right Hip ABduction 3/5   Left Hip Flexion 4/5   Left Hip ABduction 3+/5   Right Knee Flexion 4/5   Right Knee Extension 4/5   Left Knee Flexion 4/5   Left Knee Extension 4/5   Right Ankle Dorsiflexion 5/5   Left Ankle Dorsiflexion 5/5   Flexibility   Soft Tissue Assessment /Muscle Lenght  yes  bil hip flex tightness in sidelying noted   Hamstrings bil hamstring tightness with SLR   ITB bil ITB tightness with Ober's test   Ambulation/Gait   Ambulation/Gait Yes   Ambulation/Gait Assistance 5: Supervision   Ambulation/Gait Assistance Details increased DOE with ambulation: O2 sats 99%   Ambulation Distance (Feet) 100 Feet   Assistive device None   Gait Pattern Trendelenburg;Lateral hip instability;Decreased trunk rotation;Trunk flexed   Gait velocity 2.69 ft/sec  12.20 sec            PT Education - 19-Apr-2014 1411    Education provided No            PT Long Term Goals - Apr 19, 2014 1413    PT LONG TERM GOAL #1   Title independent with HEP (05/12/14)   Time 6   Period Weeks   Status New   PT LONG TERM GOAL #2   Title report pain no great than 6/10 with walking (05/12/14)   Time 6   Period Weeks   Status New   PT LONG TERM GOAL #3   Title improve gait velocity to > 2.9 ft/sec for improved functional mobility (05/12/14)   Time 6   Period Weeks   Status New   PT LONG TERM GOAL #4   Title improve lumbar ROM by 5 degrees all motions for improved flexibility (05/12/14)   Time 6   Period Weeks   Status New          Plan - 2014/04/19 1411    Clinical Impression Statement Pt presents to OPPT with low back pain and difficulty with ambulation.  Presents with limited ROM and hip tightness and weakness.  Will benefit from PT to maximize function and decrease pain   Pt will benefit from skilled therapeutic intervention in order to improve on the following deficits Abnormal gait;Impaired flexibility;Pain;Postural dysfunction;Improper body mechanics;Decreased mobility;Decreased endurance;Decreased range of motion;Decreased strength;Difficulty walking;Decreased balance   Rehab Potential Good   PT Frequency 2x / week   PT Duration 6 weeks  plan to see 4 weeks total (may need to see over 6 weeks due to holidays)   PT Treatment/Interventions ADLs/Self Care Home  Management;Electrical Stimulation;Gait training;Therapeutic exercise;Patient/family education;Balance training;Stair training;Functional mobility training;Neuromuscular re-education;Manual techniques;Passive range of motion;Dry needling;Therapeutic activities;Cryotherapy;Ultrasound   PT Next Visit Plan HEP for flexibility and core/hip strengthening   Consulted and Agree with Plan of Care Patient          G-Codes - 04-19-2014 1415    Functional Assessment Tool Used clinical judgement: lumbar ROM limited flexion, ext, sidebending; pain up to 9/10   Functional Limitation Mobility:  Walking and moving around   Mobility: Walking and Moving Around Current Status [phone]) At least 40 percent but less than 60 percent impaired, limited or restricted   Mobility: Walking and Moving Around Goal Status [phone]) At least 20 percent but less than 40 percent impaired, limited or restricted      Problem List Patient Active Problem List   Diagnosis Date Noted  . Postlaminectomy syndrome, lumbar region 01/09/2014  . Other secondary scoliosis, thoracolumbar region 01/09/2014  . Viral URI with cough 12/16/2013  . Rapid heart rate 08/05/2013  . Chronic radicular low back pain 06/03/2013  . Unspecified hypothyroidism 06/03/2013  . Accelerated hypertension 06/03/2013  . Insomnia 06/03/2013  . BPH without urinary obstruction 06/03/2013                                           Laureen Abrahams, PT, DPT 03/31/2014 2:20 PM [address] [phone] (office) [phone] (fax)

## 2014-04-03 ENCOUNTER — Other Ambulatory Visit: Payer: Self-pay | Admitting: Family Medicine

## 2014-04-07 ENCOUNTER — Ambulatory Visit: Payer: Medicare Other | Admitting: Rehabilitation

## 2014-04-07 DIAGNOSIS — R269 Unspecified abnormalities of gait and mobility: Secondary | ICD-10-CM

## 2014-04-07 DIAGNOSIS — M545 Low back pain, unspecified: Secondary | ICD-10-CM

## 2014-04-07 NOTE — Therapy (Signed)
[facility]patient [facility] [address] [place], [place], [address] Phone: [phone]   Fax:  [phone]  Physical Therapy Treatment  Patient Details  Name: Jacaden Forbush MRN: [id number] Date of Birth: [birth year]  Encounter Date: 04/07/2014      PT End of Session - 04/07/14 1415    Visit Number 2   Number of Visits 8   Date for PT Re-Evaluation 05/30/14   PT Start Time 0130   PT Stop Time 0215   PT Time Calculation (min) 45 min      Past Medical History  Diagnosis Date  . Arthritis   . Depression   . Chicken pox   . Diverticulitis   . Allergy   . Arrhythmia   . Hypertension   . Colon polyps   . Thyroid disease     Past Surgical History  Procedure Laterality Date  . Total hip arthroplasty  09/21/06    right  . Other surgical history  09/01/10    spine decompression  . Spine surgery  09/11/10  . Prostate surgery  01/28/13    laser    There were no vitals taken for this visit.  Visit Diagnosis:  Bilateral low back pain without sciatica  Abnormality of gait      Subjective Assessment - 04/07/14 1341    Symptoms pt reports pain has worsened in last week, he is afraid he will not be able to walk. His pain is high with walking   Pertinent History lumbar decompression   Currently in Pain? Yes   Pain Score 7    Pain Location Back   Pain Orientation Lower   Pain Descriptors / Indicators Burning;Sharp   Pain Type Chronic pain   Aggravating Factors  walking   Pain Relieving Factors lying down            [facility] Adult PT Treatment/Exercise - 04/07/14 1347    Lumbar Exercises: Stretches   Active Hamstring Stretch 30 seconds;3 reps   Single Knee to Chest Stretch 3 reps;30 seconds   Hip Flexor Stretch 60 seconds;2 reps  bilateral   ITB Stretch 30 seconds;1 rep  side lying with knees bent   Piriformis Stretch 30 seconds;2 reps   Lumbar Exercises: Supine   Bridge 10 reps          PT Education - 04/07/14 1415    Education  provided Yes   Education Details HEP   Person(s) Educated Patient   Methods Explanation;Handout   Comprehension Verbalized understanding              Plan - 04/07/14 1415    Clinical Impression Statement No increased pain after treatment today   PT Next Visit Plan Review HEP add core, hip strength        Problem List Patient Active Problem List   Diagnosis Date Noted  . Postlaminectomy syndrome, lumbar region 01/09/2014  . Other secondary scoliosis, thoracolumbar region 01/09/2014  . Viral URI with cough 12/16/2013  . Rapid heart rate 08/05/2013  . Chronic radicular low back pain 06/03/2013  . Unspecified hypothyroidism 06/03/2013  . Accelerated hypertension 06/03/2013  . Insomnia 06/03/2013  . BPH without urinary obstruction 06/03/2013    Dorene Ar, PTA 04/07/2014, 2:17 PM

## 2014-04-07 NOTE — Patient Instructions (Signed)
   HIP: Flexors - Supine   Lie on edge of surface. Place leg off the surface, allow knee to bend. Bring other knee toward chest. Hold __2 minutes each side , 2 times per day   Copyright  VHI. All rights reserved.  Piriformis (Supine)  JUST CROSS LEG AND PRESS KNEE AWAY GENTLY  Cross legs, right on top. Gently pull other knee toward chest until stretch is felt in buttock/hip of top leg. Hold _30___ seconds. Repeat ___3_ times per set.  Do __2__ sessions per day.  http://orth.exer.us/676    Copyright  VHI. All rights reserved.    Hamstring Step 1   Straighten left knee. Keep knee level with other knee or on bolster. Hold30___ seconds. Relax knee by returning foot to start. Repeat _3__ times. Each leg Copyright  VHI. All rights reserved.   Copyright  VHI. All rights reserved.  Flexors, Supine Bridge   Lie supine, feet shoulder-width apart. Lift hips toward ceiling. Hold __5_ seconds. Repeat __10_ times per session. Do _2__ sessions per day.

## 2014-04-09 ENCOUNTER — Ambulatory Visit: Payer: Medicare Other | Admitting: Physical Therapy

## 2014-04-09 DIAGNOSIS — R269 Unspecified abnormalities of gait and mobility: Secondary | ICD-10-CM

## 2014-04-09 DIAGNOSIS — M545 Low back pain, unspecified: Secondary | ICD-10-CM

## 2014-04-09 NOTE — Therapy (Signed)
Outpatient Rehabilitation [facility] [address] [place], [place], [address] Phone: [phone]   Fax:  [phone]  Physical Therapy Treatment  Patient Details  Name: Ronald Strong MRN: [id number] Date of Birth: [birth year]  Encounter Date: 04/09/2014      PT End of Session - 04/09/14 1457    Visit Number 3   Number of Visits 8   Date for PT Re-Evaluation 05/30/14   PT Start Time 1884   PT Stop Time 1456   PT Time Calculation (min) 41 min   Activity Tolerance Patient tolerated treatment well   Behavior During Therapy [facility] for tasks assessed/performed      Past Medical History  Diagnosis Date  . Arthritis   . Depression   . Chicken pox   . Diverticulitis   . Allergy   . Arrhythmia   . Hypertension   . Colon polyps   . Thyroid disease     Past Surgical History  Procedure Laterality Date  . Total hip arthroplasty  09/21/06    right  . Other surgical history  09/01/10    spine decompression  . Spine surgery  09/11/10  . Prostate surgery  01/28/13    laser    There were no vitals taken for this visit.  Visit Diagnosis:  Bilateral low back pain without sciatica  Abnormality of gait      Subjective Assessment - 04/09/14 1420    Symptoms feels like exercises are improving; still having pain   Pertinent History lumbar decompression   Limitations Walking   How long can you walk comfortably? 30 yards   Patient Stated Goals walk standing up straight, improve mobility   Currently in Pain? Yes   Pain Score 7    Pain Location Back   Pain Orientation Lower   Pain Descriptors / Indicators Burning;Sharp   Pain Type Chronic pain   Pain Onset More than a month ago   Pain Frequency Intermittent   Aggravating Factors  walking            [facility] Adult PT Treatment/Exercise - 04/09/14 1435    Lumbar Exercises: Stretches   Active Hamstring Stretch 30 seconds;3 reps   Single Knee to Chest Stretch 2 reps;30 seconds  maintained 90 degrees hip flex RLE due  to hx THA   Hip Flexor Stretch 1 rep  2 min   Piriformis Stretch 30 seconds;2 reps   Lumbar Exercises: Aerobic   Stationary Bike NuStep Level 4 x 5 min, 4 extremities   Lumbar Exercises: Supine   Ab Set 10 reps;5 seconds   Bridge 10 reps;5 seconds   Lumbar Exercises: Sidelying   Clam 15 reps          PT Education - 04/09/14 1457    Education provided No            PT Long Term Goals - 04/09/14 1458    PT LONG TERM GOAL #1   Title independent with HEP (05/12/14)   Time 6   Period Weeks   Status On-going   PT LONG TERM GOAL #2   Title report pain no great than 6/10 with walking (05/12/14)   Time 6   Period Weeks   Status On-going   PT LONG TERM GOAL #3   Title improve gait velocity to > 2.9 ft/sec for improved functional mobility (05/12/14)   Time 6   Period Weeks   Status On-going   PT LONG TERM GOAL #4   Title improve lumbar ROM by 5  degrees all motions for improved flexibility (05/12/14)   Time 6   Period Weeks   Status On-going          Plan - 04/09/14 1457    Clinical Impression Statement Pt reports excercises helping and compliant with HEP.  Will plan to add to HEP as tolerated.   PT Next Visit Plan core, hip strengthening; add to HEP if pt tolerates                               Problem List Patient Active Problem List   Diagnosis Date Noted  . Postlaminectomy syndrome, lumbar region 01/09/2014  . Other secondary scoliosis, thoracolumbar region 01/09/2014  . Viral URI with cough 12/16/2013  . Rapid heart rate 08/05/2013  . Chronic radicular low back pain 06/03/2013  . Unspecified hypothyroidism 06/03/2013  . Accelerated hypertension 06/03/2013  . Insomnia 06/03/2013  . BPH without urinary obstruction 06/03/2013   Laureen Abrahams, PT, DPT 04/09/2014 2:59 PM  [facility] [address] [place] [address]  [phone] (office) [phone] (fax)

## 2014-04-14 ENCOUNTER — Ambulatory Visit: Payer: Medicare Other | Admitting: Rehabilitation

## 2014-04-14 DIAGNOSIS — M545 Low back pain, unspecified: Secondary | ICD-10-CM

## 2014-04-14 DIAGNOSIS — R269 Unspecified abnormalities of gait and mobility: Secondary | ICD-10-CM

## 2014-04-14 NOTE — Therapy (Signed)
Outpatient Rehabilitation [facility] [address] [place], [place], [address] Phone: [phone]   Fax:  [phone]  Physical Therapy Treatment  Patient Details  Name: Ronald Strong MRN: [id number] Date of Birth: 02/28/1921  Encounter Date: 04/14/2014      PT End of Session - 04/14/14 1423    Visit Number 4   Number of Visits 8   Date for PT Re-Evaluation 05/30/14   PT Start Time 0130   PT Stop Time 0215   PT Time Calculation (min) 45 min      Past Medical History  Diagnosis Date  . Arthritis   . Depression   . Chicken pox   . Diverticulitis   . Allergy   . Arrhythmia   . Hypertension   . Colon polyps   . Thyroid disease     Past Surgical History  Procedure Laterality Date  . Total hip arthroplasty  09/21/06    right  . Other surgical history  09/01/10    spine decompression  . Spine surgery  09/11/10  . Prostate surgery  01/28/13    laser    There were no vitals taken for this visit.  Visit Diagnosis:  Bilateral low back pain without sciatica  Abnormality of gait      Subjective Assessment - 04/14/14 1340    Symptoms My wife says I am waking a lot straighter and I feel much better. Pt reports he can now walk to get newspaper without difficulty which previously took quite a bit of effort, pain and required rest breaks.    Pertinent History lumbar decompression   Pain Score 3    Pain Location Back   Pain Orientation Lower   Pain Descriptors / Indicators Aching   Pain Type Chronic pain   Pain Frequency Constant   Aggravating Factors  prolonged walking   Pain Relieving Factors rest          [facility] PT Assessment - 04/14/14 1421    AROM   Lumbar Flexion 50   Lumbar Extension 14   Lumbar - Right Side Bend 20   Lumbar - Left Side Bend 20   Ambulation/Gait   Gait velocity 2.72Ft/sec  30 feet in 11 seconds          [facility] Adult PT Treatment/Exercise - 04/14/14 1346    Lumbar Exercises: Stretches   Hip Flexor Stretch 1 rep  2 min  each side   Piriformis Stretch 3 reps;30 seconds   Lumbar Exercises: Aerobic   Stationary Bike Nustep level 3 x 6 min UE/LE   Lumbar Exercises: Supine   Bridge 10 reps;5 seconds   Straight Leg Raise 10 reps   Lumbar Exercises: Sidelying   Clam 10 reps   Hip Abduction 10 reps  with bent knee          PT Education - 04/14/14 1346    Education provided Yes   Education Details HEP for Hip strength   Person(s) Educated Patient   Methods Explanation;Handout   Comprehension Verbalized understanding            PT Long Term Goals - 04/14/14 1430    PT LONG TERM GOAL #1   Title independent with HEP (05/12/14)   Time 6   Period Weeks   Status On-going   PT LONG TERM GOAL #2   Title report pain no great than 6/10 with walking (05/12/14)   Time 6   Period Weeks   Status On-going   PT LONG TERM GOAL #3  Title improve gait velocity to > 2.9 ft/sec for improved functional mobility (05/12/14)   Time 6   Period Weeks   Status On-going   PT LONG TERM GOAL #4   Title improve lumbar ROM by 5 degrees all motions for improved flexibility (05/12/14)   Time 6   Period Weeks   Status Achieved          Plan - 04/14/14 1424    Clinical Impression Statement R>L hip strength likely due to THA 4 years ago, able to add hip strengthening today, pt reports improved walking tolerance prior to pain and demonstrates more erect posture. Lumbar AROM improved. LTG #4 MET   PT Next Visit Plan core, review hip strengthening HEP       Problem List Patient Active Problem List   Diagnosis Date Noted  . Postlaminectomy syndrome, lumbar region 01/09/2014  . Other secondary scoliosis, thoracolumbar region 01/09/2014  . Viral URI with cough 12/16/2013  . Rapid heart rate 08/05/2013  . Chronic radicular low back pain 06/03/2013  . Unspecified hypothyroidism 06/03/2013  . Accelerated hypertension 06/03/2013  . Insomnia 06/03/2013  . BPH without urinary obstruction 06/03/2013    Dorene Ar, PTA 04/14/2014, 2:33 PM

## 2014-04-14 NOTE — Patient Instructions (Signed)
Hip Flexion / Knee Extension: Straight-Leg Raise (Eccentric)   Lie on back. Lift leg with knee straight. Slowly lower leg for 3-5 seconds. __10_ reps per set on each leg.Marland Kitchen 1-2 times per day   Abduction: Clam (Eccentric) - Side-Lying   Lie on side with knees bent. Lift top knee, keeping feet together. Keep trunk steady. Slowly lower for 3-5 seconds. _10-20__ reps. 1-2 times per day.   Copyright  VHI. All rights reserved.  Abduction: Side Leg Lift (Eccentric) - Side-Lying  KEEP KNEEs BENT Lie on side. Lift top leg slightly higher than shoulder level.  Slowly lower for 3-5 seconds. _10__ reps per set,1-2 x per day Copyright  VHI. All rights reserved.

## 2014-04-16 ENCOUNTER — Ambulatory Visit: Payer: Medicare Other | Admitting: Physical Therapy

## 2014-04-16 DIAGNOSIS — M545 Low back pain, unspecified: Secondary | ICD-10-CM

## 2014-04-16 DIAGNOSIS — R269 Unspecified abnormalities of gait and mobility: Secondary | ICD-10-CM

## 2014-04-16 NOTE — Therapy (Signed)
Outpatient Rehabilitation [facility] [address] [place], [place], [address] Phone: [phone]   Fax:  [phone]  Physical Therapy Treatment  Patient Details  Name: Ronald Strong MRN: [id number] Date of Birth: Apr 18, 1921  Encounter Date: 04/16/2014      PT End of Session - 04/16/14 1458    Visit Number 5   Number of Visits 8   Date for PT Re-Evaluation 05/30/14   PT Start Time 7858   PT Stop Time 1455   PT Time Calculation (min) 40 min   Activity Tolerance Patient tolerated treatment well   Behavior During Therapy [facility] for tasks assessed/performed      Past Medical History  Diagnosis Date  . Arthritis   . Depression   . Chicken pox   . Diverticulitis   . Allergy   . Arrhythmia   . Hypertension   . Colon polyps   . Thyroid disease     Past Surgical History  Procedure Laterality Date  . Total hip arthroplasty  09/21/06    right  . Other surgical history  09/01/10    spine decompression  . Spine surgery  09/11/10  . Prostate surgery  01/28/13    laser    There were no vitals taken for this visit.  Visit Diagnosis:  Bilateral low back pain without sciatica  Abnormality of gait      Subjective Assessment - 04/16/14 1420    Symptoms Sore yesterday and had trouble doing exercises   Pertinent History lumbar decompression   Limitations Walking   How long can you walk comfortably? doing better; still a little uncomfortable   Patient Stated Goals walk standing up straight, improve mobility   Currently in Pain? Yes   Pain Score 4   no pain sitting   Pain Location Back   Pain Orientation Lower   Pain Descriptors / Indicators Aching   Pain Type Chronic pain   Pain Onset More than a month ago   Aggravating Factors  walking   Pain Relieving Factors rest            [facility] Adult PT Treatment/Exercise - 04/16/14 1422    Lumbar Exercises: Aerobic   Stationary Bike NuStep Level 4 x 8 min   Lumbar Exercises: Standing   Heel Raises 20 reps   Other Standing Lumbar Exercises standing hip ext, abdct, marching x 10 reps bil with UE support   Other Standing Lumbar Exercises marching, sidestepping and tandem walking forward/backward with intermittent UE support; cues for core activation and posture   Lumbar Exercises: Supine   Clam 10 reps;Other (comment)  with ab set; single limb abdct x 10 bil with yellow tband   Straight Leg Raise 10 reps   Lumbar Exercises: Sidelying   Clam 10 reps   Hip Abduction 10 reps  with knee flexed          PT Education - 04/16/14 1458    Education provided No            PT Long Term Goals - 04/16/14 1459    PT LONG TERM GOAL #1   Title independent with HEP (05/12/14)   Time 6   Period Weeks   Status On-going   PT LONG TERM GOAL #2   Title report pain no great than 6/10 with walking (05/12/14)   Time 6   Period Weeks   Status On-going   PT LONG TERM GOAL #3   Title improve gait velocity to > 2.9 ft/sec for improved functional mobility (  05/12/14)   Time 6   Period Weeks   Status On-going   PT LONG TERM GOAL #4   Title improve lumbar ROM by 5 degrees all motions for improved flexibility (05/12/14)   Time 6   Period Weeks   Status Achieved          Plan - 04/16/14 1458    Clinical Impression Statement Pt continues to report soreness after therapy sessions but feels mobility overall improving.  Will continue to benefit from PT to maximize function.   PT Next Visit Plan core, review hip strengthening HEP   Consulted and Agree with Plan of Care Patient                               Problem List Patient Active Problem List   Diagnosis Date Noted  . Postlaminectomy syndrome, lumbar region 01/09/2014  . Other secondary scoliosis, thoracolumbar region 01/09/2014  . Viral URI with cough 12/16/2013  . Rapid heart rate 08/05/2013  . Chronic radicular low back pain 06/03/2013  . Unspecified hypothyroidism 06/03/2013  . Accelerated hypertension 06/03/2013  .  Insomnia 06/03/2013  . BPH without urinary obstruction 06/03/2013     Laureen Abrahams, PT, DPT 04/16/2014 3:01 PM  [facility] [address], [place] [address]  [phone] (office) [phone] (fax)

## 2014-04-21 ENCOUNTER — Ambulatory Visit: Payer: Medicare Other | Admitting: Rehabilitation

## 2014-04-21 DIAGNOSIS — M545 Low back pain, unspecified: Secondary | ICD-10-CM

## 2014-04-21 DIAGNOSIS — R269 Unspecified abnormalities of gait and mobility: Secondary | ICD-10-CM

## 2014-04-21 NOTE — Patient Instructions (Signed)
Flexors, Lunge  PUT A CHAIR IN FRONT TO HOLD ON TO  Step into a deep forward lunge, hands on thigh, front knee not past line of toes, other knee lightly touching floor. Push back leg straight, raising knee. Hold _30__ seconds.  Repeat 3___ times per session each leg. Do __2_ sessions per day.  Copyright  VHI. All rights reserved.

## 2014-04-21 NOTE — Therapy (Signed)
[place], [place], [address] Phone: [phone]   Fax:  [phone]  Physical Therapy Treatment  Patient Details  Name: Ronald Strong MRN: [id number] Date of Birth: [birth year]  Encounter Date: 04/21/2014      PT End of Session - 04/21/14 1213    Visit Number 6   Number of Visits 8   Date for PT Re-Evaluation 05/30/14   PT Start Time 1147   PT Stop Time 1230   PT Time Calculation (min) 43 min      Past Medical History  Diagnosis Date  . Arthritis   . Depression   . Chicken pox   . Diverticulitis   . Allergy   . Arrhythmia   . Hypertension   . Colon polyps   . Thyroid disease     Past Surgical History  Procedure Laterality Date  . Total hip arthroplasty  09/21/06    right  . Other surgical history  09/01/10    spine decompression  . Spine surgery  09/11/10  . Prostate surgery  01/28/13    laser    There were no vitals taken for this visit.  Visit Diagnosis:  Bilateral low back pain without sciatica  Abnormality of gait      Subjective Assessment - 04/21/14 1153    Symptoms sore after every visit, even more after last treatment. Pt reports his step length is improving and his thigh muscles are starting to relax   Pertinent History lumbar decompression   Currently in Pain? No/denies  not really hurting   Pain Descriptors / Indicators --  stiffness in the back   Pain Type Chronic pain   Pain Onset More than a month ago   Pain Frequency Constant   Aggravating Factors  shopping with wife   Pain Relieving Factors rest, holding onto cart           [facility] Adult PT Treatment/Exercise - 04/21/14 1204    Lumbar Exercises: Aerobic   Stationary Bike Nustep Level 5 x 6 minutes    Lumbar Exercises: Supine   Clam 20 reps  red band   Bridge 20 reps;5 seconds   Straight Leg Raise 20 reps   Lumbar Exercises: Sidelying   Clam 20 reps   Hip Abduction 10 reps  with knee partially  flexed    Manual Therapy   Manual Therapy Passive ROM   Passive ROM sidelying hip extension Passive stretching 3 x 30 seconds each                PT Education - 04/21/14 1236    Education provided Yes   Education Details Hip Extension Lunge Stretch   Person(s) Educated Patient   Methods Explanation;Handout   Comprehension Verbalized understanding             PT Long Term Goals - 04/21/14 1159    PT LONG TERM GOAL #1   Title independent with HEP (05/12/14)   Time 6   Period Weeks   Status On-going   PT LONG TERM GOAL #2   Title report pain no great than 6/10 with walking (05/12/14)   Time 6   Period Weeks   Status On-going   PT LONG TERM GOAL #3   Title improve gait velocity to > 2.9 ft/sec for improved functional mobility (05/12/14)   Time 6   Period Weeks   Status On-going   PT LONG TERM GOAL #4   Title improve lumbar ROM by 5  degrees all motions for improved flexibility (05/12/14)   Time 6   Period Weeks   Status Achieved               Plan - 04/21/14 1158    Clinical Impression Statement pt reports he is able to shop with wife without holding onto cart for first 10 minutes which is improved from inital eval however he reports pain still increases to 8/10. He also reports decreased sit-supine transfers   PT Next Visit Plan continue core, hip strength, ROM, review new hip extension stretch        Problem List Patient Active Problem List   Diagnosis Date Noted  . Postlaminectomy syndrome, lumbar region 01/09/2014  . Other secondary scoliosis, thoracolumbar region 01/09/2014  . Viral URI with cough 12/16/2013  . Rapid heart rate 08/05/2013  . Chronic radicular low back pain 06/03/2013  . Unspecified hypothyroidism 06/03/2013  . Accelerated hypertension 06/03/2013  . Insomnia 06/03/2013  . BPH without urinary obstruction 06/03/2013    Dorene Ar, [place] 04/21/2014, 12:38 PM  [facility] [address] [place], [place], [address] Phone: [phone]   Fax:  [phone]

## 2014-04-28 ENCOUNTER — Ambulatory Visit: Payer: Medicare Other | Admitting: Physical Therapy

## 2014-04-28 DIAGNOSIS — M545 Low back pain, unspecified: Secondary | ICD-10-CM

## 2014-04-28 DIAGNOSIS — R269 Unspecified abnormalities of gait and mobility: Secondary | ICD-10-CM

## 2014-04-28 NOTE — Therapy (Signed)
[place], [place], [address] Phone: [phone]   Fax:  [phone]  Physical Therapy Treatment  Patient Details  Name: Ronald Strong MRN: [id number] Date of Birth: [birth year]  Encounter Date: 04/28/2014      PT End of Session - 04/28/14 1410    Visit Number 7   Number of Visits 8   Date for PT Re-Evaluation 05/30/14   PT Start Time 1330   PT Stop Time 1408   PT Time Calculation (min) 38 min   Activity Tolerance Patient tolerated treatment well   Behavior During Therapy [facility] for tasks assessed/performed      Past Medical History  Diagnosis Date  . Arthritis   . Depression   . Chicken pox   . Diverticulitis   . Allergy   . Arrhythmia   . Hypertension   . Colon polyps   . Thyroid disease     Past Surgical History  Procedure Laterality Date  . Total hip arthroplasty  09/21/06    right  . Other surgical history  09/01/10    spine decompression  . Spine surgery  09/11/10  . Prostate surgery  01/28/13    laser    There were no vitals taken for this visit.  Visit Diagnosis:  Bilateral low back pain without sciatica  Abnormality of gait      Subjective Assessment - 04/28/14 1333    Symptoms back is acting up today, "could be the weather changes."   Pertinent History lumbar decompression   Limitations Walking   How long can you walk comfortably? doing better; still a little uncomfortable   Patient Stated Goals walk standing up straight, improve mobility   Currently in Pain? Yes   Pain Score 8   no pain sitting   Pain Location Back   Pain Orientation Lower   Pain Descriptors / Indicators Sore;Aching   Pain Type Chronic pain   Pain Onset More than a month ago   Pain Frequency Intermittent   Aggravating Factors  standing/walking   Pain Relieving Factors rest, holding onto cart                    [facility] Adult PT Treatment/Exercise - 04/28/14 1335    Lumbar Exercises:  Stretches   Hip Flexor Stretch 2 reps;30 seconds  standing   Lumbar Exercises: Aerobic   Stationary Bike NuStep Level 3 x 8 min   Lumbar Exercises: Supine   Bridge 10 reps;5 seconds   Straight Leg Raise 10 reps;Other (comment)  2#   Lumbar Exercises: Sidelying   Clam 15 reps;Other (comment)   Clam Limitations yellow theraband   Hip Abduction 10 reps;Other (comment);Weights   Hip Abduction Weights (lbs) 2#   Knee/Hip Exercises: Standing   Heel Raises 20 reps;2 seconds   Other Standing Knee Exercises sit to/from stand without UE support 2x10 reps   Other Standing Knee Exercises standing toe raises x 20; alt hip abdct x 10 bil; standing alt hip ext x 10 bil                PT Education - 04/28/14 1410    Education provided Yes   Education Details HEP sit to stand   Person(s) Educated Patient   Methods Explanation;Handout;Demonstration   Comprehension Verbalized understanding;Returned demonstration             PT Long Term Goals - 04/28/14 1411    PT LONG TERM GOAL #1   Title independent  with HEP (05/12/14)   Time 6   Period Weeks   Status Achieved   PT LONG TERM GOAL #2   Title report pain no great than 6/10 with walking (05/12/14)   Time 6   Period Weeks   Status On-going   PT LONG TERM GOAL #3   Title improve gait velocity to > 2.9 ft/sec for improved functional mobility (05/12/14)   Time 6   Period Weeks   Status On-going   PT LONG TERM GOAL #4   Title improve lumbar ROM by 5 degrees all motions for improved flexibility (05/12/14)   Time 6   Period Weeks   Status Achieved               Plan - 04/28/14 1410    Clinical Impression Statement Pt reports increased ease with ADLs and overall improved mobility.  Anticipate d/c and transition to community program at next session.   PT Next Visit Plan check goals, foto, d/c, gcode   Consulted and Agree with Plan of Care Patient        Problem List Patient Active Problem List   Diagnosis Date  Noted  . Postlaminectomy syndrome, lumbar region 01/09/2014  . Other secondary scoliosis, thoracolumbar region 01/09/2014  . Viral URI with cough 12/16/2013  . Rapid heart rate 08/05/2013  . Chronic radicular low back pain 06/03/2013  . Unspecified hypothyroidism 06/03/2013  . Accelerated hypertension 06/03/2013  . Insomnia 06/03/2013  . BPH without urinary obstruction 06/03/2013     Laureen Abrahams, PT, DPT 04/28/2014 2:12 PM  [facility] [address] [place], [place], [address] Phone: [phone]   Fax:  [phone]

## 2014-04-28 NOTE — Patient Instructions (Signed)
Functional Quadriceps: Sit to Stand   Sit on edge of chair, feet flat on floor. Stand upright, extending knees fully. DO NOT LET KNEES COME TOGETHER. Repeat _10___ times per set. Do __2__ sets per session. Do __1-2__ sessions per day.  http://orth.exer.us/734   Copyright  VHI. All rights reserved.   Laureen Abrahams, PT, DPT 04/28/2014 2:01 PM  [facility] [address], [place] [address]  [phone] (office) [phone] (fax)

## 2014-04-30 ENCOUNTER — Ambulatory Visit: Payer: Medicare Other | Admitting: Physical Therapy

## 2014-04-30 DIAGNOSIS — M545 Low back pain, unspecified: Secondary | ICD-10-CM

## 2014-04-30 DIAGNOSIS — R269 Unspecified abnormalities of gait and mobility: Secondary | ICD-10-CM

## 2014-04-30 NOTE — Therapy (Addendum)
[place], [place], [address] Phone: [phone]   Fax:  [phone]  Physical Therapy Treatment  Patient Details  Name: Ronald Strong MRN: [id number] Date of Birth: 30-Mar-1921  Encounter Date: 04/30/2014      PT End of Session - 04/30/14 1352    Visit Number 8   Number of Visits 8   Date for PT Re-Evaluation 05/30/14   PT Start Time 0272   PT Stop Time 1351   PT Time Calculation (min) 23 min   Activity Tolerance Patient tolerated treatment well   Behavior During Therapy [facility] for tasks assessed/performed      Past Medical History  Diagnosis Date  . Arthritis   . Depression   . Chicken pox   . Diverticulitis   . Allergy   . Arrhythmia   . Hypertension   . Colon polyps   . Thyroid disease     Past Surgical History  Procedure Laterality Date  . Total hip arthroplasty  09/21/06    right  . Other surgical history  09/01/10    spine decompression  . Spine surgery  09/11/10  . Prostate surgery  01/28/13    laser    There were no vitals taken for this visit.  Visit Diagnosis:  Abnormality of gait  Bilateral low back pain without sciatica      Subjective Assessment - 04/30/14 1332    Symptoms a little sore today; attributes that to weather   Pertinent History lumbar decompression   Limitations Walking   How long can you walk comfortably? doing better; still a little uncomfortable   Patient Stated Goals walk standing up straight, improve mobility   Currently in Pain? No/denies  back up to 7/10 with walking                    [facility] Adult PT Treatment/Exercise - 04/30/14 1333    Ambulation/Gait   Gait velocity 3.64 ft/sec  9.02 sec   Lumbar Exercises: Aerobic   Stationary Bike NuStep Level 5 x 8 min                PT Education - 04/30/14 1352    Education provided Yes   Education Details community fitness options including silver sneakers, progression of HEP and  how often to complete   Person(s) Educated Patient   Methods Explanation   Comprehension Verbalized understanding             PT Long Term Goals - 04/30/14 1353    PT LONG TERM GOAL #1   Title independent with HEP (05/12/14)   Time 6   Period Weeks   Status Achieved   PT LONG TERM GOAL #2   Title report pain no great than 6/10 with walking (05/12/14)   Time 6   Period Weeks   Status Partially Met  pt reports pain varies between 5-7/10 with amb depending on weather   PT LONG TERM GOAL #3   Title improve gait velocity to > 2.9 ft/sec for improved functional mobility (05/12/14)   Time 6   Period Weeks   Status Achieved   PT LONG TERM GOAL #4   Title improve lumbar ROM by 5 degrees all motions for improved flexibility (05/12/14)   Time 6   Period Weeks   Status Achieved               Plan - 04/30/14 1352    Clinical Impression Statement Pt has  met all goals and is ready for d/c and transition to home and community programs.   PT Next Visit Plan d/c PT   Consulted and Agree with Plan of Care Patient          G-Codes - 2014-05-24 1354    Functional Assessment Tool Used clinical judgement: improved lumbar ROM and pain only up to 7/10 with ambulation   Functional Limitation Mobility: Walking and moving around   Mobility: Walking and Moving Around Goal Status [phone]) At least 20 percent but less than 40 percent impaired, limited or restricted   Mobility: Walking and Moving Around Discharge Status [phone]) At least 20 percent but less than 40 percent impaired, limited or restricted      Problem List Patient Active Problem List   Diagnosis Date Noted  . Postlaminectomy syndrome, lumbar region 01/09/2014  . Other secondary scoliosis, thoracolumbar region 01/09/2014  . Viral URI with cough 12/16/2013  . Rapid heart rate 08/05/2013  . Chronic radicular low back pain 06/03/2013  . Unspecified hypothyroidism 06/03/2013  . Accelerated hypertension 06/03/2013  . Insomnia  06/03/2013  . BPH without urinary obstruction 06/03/2013   Laureen Abrahams, PT, DPT 2014-05-24 1:55 PM  [facility] [address] [place], [place], [address] Phone: [phone]   Fax:  [phone]     PHYSICAL THERAPY DISCHARGE SUMMARY  Visits from Start of Care: 8  Current functional level related to goals / functional outcomes: See above all goals met    Remaining deficits: Pt continues to report back pain with increased activity consistent with degenerative changes in lumbar spine.  Pt able to verbalize ways to decrease pain and reports exercises continue to help with decreasing pain.   Education / Equipment: Banker, HEP, community fitness  Plan: Patient agrees to discharge.  Patient goals were met. Patient is being discharged due to meeting the stated rehab goals.  ?????       Laureen Abrahams, PT, DPT 2014-05-24 1:58 PM  [facility] [address] [place] [address]  [phone] (office) [phone] (fax)

## 2014-05-04 ENCOUNTER — Ambulatory Visit: Payer: Medicare Other | Admitting: Rehabilitation

## 2014-05-04 ENCOUNTER — Other Ambulatory Visit: Payer: Self-pay | Admitting: Family Medicine

## 2014-05-06 ENCOUNTER — Encounter: Payer: Medicare Other | Admitting: Rehabilitation

## 2014-05-18 ENCOUNTER — Encounter: Payer: Medicare Other | Attending: Physical Medicine & Rehabilitation

## 2014-05-18 ENCOUNTER — Ambulatory Visit (HOSPITAL_BASED_OUTPATIENT_CLINIC_OR_DEPARTMENT_OTHER): Payer: Medicare Other | Admitting: Physical Medicine & Rehabilitation

## 2014-05-18 ENCOUNTER — Encounter: Payer: Self-pay | Admitting: Physical Medicine & Rehabilitation

## 2014-05-18 VITALS — BP 124/51 | HR 77

## 2014-05-18 DIAGNOSIS — M961 Postlaminectomy syndrome, not elsewhere classified: Secondary | ICD-10-CM | POA: Insufficient documentation

## 2014-05-18 MED ORDER — TRAMADOL HCL 50 MG PO TABS: 50.0000 mg | ORAL_TABLET | Freq: Three times a day (TID) | ORAL | Status: DC

## 2014-05-18 NOTE — Progress Notes (Signed)
Subjective:    Patient ID: Ronald Strong, male    DOB: 03/14/1921, 81 y.o.   MRN: [id number]  HPI Completed PT at [facility], exercises now doing 10-15 exercises per day Some flexion stretches, hip abd exercises  Burning pain in bed starts in am Trying OTC TENs unit Has not tried  acupuncture  Pain Inventory Average Pain 8 Pain Right Now 1 My pain is sharp and burning  In the last 24 hours, has pain interfered with the following? General activity 8 Relation with others 10 Enjoyment of life 10 What TIME of day is your pain at its worst? evening Sleep (in general) Fair  Pain is worse with: walking, bending and standing Pain improves with: rest, heat/ice, therapy/exercise and medication Relief from Meds: 6  Mobility walk without assistance how many minutes can you walk? 15 ability to climb steps?  yes do you drive?  yes Do you have any goals in this area?  yes  Function retired  Neuro/Psych bladder control problems trouble walking  Prior Studies Any changes since last visit?  no  Physicians involved in your care Any changes since last visit?  no   Family History  Problem Relation Age of Onset  . Arthritis Other   . Heart disease Other   . Hypertension Other    History   Social History  . Marital Status: Married    Spouse Name: N/A    Number of Children: N/A  . Years of Education: N/A   Social History Main Topics  . Smoking status: Former Smoker    Quit date: 06/04/1983  . Smokeless tobacco: None  . Alcohol Use: Yes  . Drug Use: No  . Sexual Activity: None   Other Topics Concern  . None   Social History Narrative   Past Surgical History  Procedure Laterality Date  . Total hip arthroplasty  09/21/06    right  . Other surgical history  09/01/10    spine decompression  . Spine surgery  09/11/10  . Prostate surgery  01/28/13    laser   Past Medical History  Diagnosis Date  . Arthritis   . Depression   . Chicken pox   .  Diverticulitis   . Allergy   . Arrhythmia   . Hypertension   . Colon polyps   . Thyroid disease    BP 124/51 mmHg  Pulse 77  SpO2 93%  Opioid Risk Score:   Fall Risk Score:   Review of Systems  Genitourinary:       Bladder control problems retention  Musculoskeletal: Positive for gait problem.  All other systems reviewed and are negative.      Objective:   Physical Exam  Nursing note and vitals reviewed.  Constitutional: He is oriented to person, place, and time. He appears well-developed and well-nourished.  Musculoskeletal:  Lumbar back: He exhibits no swelling and no spasm.  No pain with lumbar range of motion except with extension. No tenderness to palpation in the lumbar paraspinal muscles. Kyphotic deformity  Neurological: He is alert and oriented to person, place, and time.  Psychiatric: He has a normal mood and affect.   Motor strength is 5/5 bilateral hip flexor knee extensor ankle dorsiflexor Lumbar Flexion 50%, Ext 0%      Assessment & Plan:  1. Lumbar postlaminectomy syndrome status post lumbar decompression 12/13/2011.  Overall  Has improved after physical therapy sessions. Have encouraged the patient continues home exercise program Benefiting from tramadol as well. Takes  one in am, then takes an ASA,then one in pm Return to clinic 4 months

## 2014-05-18 NOTE — Patient Instructions (Signed)
Next visit is for 4 months but if you need to be seen sooner please call  If you need in order for acupuncture please call

## 2014-05-25 ENCOUNTER — Other Ambulatory Visit: Payer: Self-pay | Admitting: Family Medicine

## 2014-05-26 ENCOUNTER — Other Ambulatory Visit: Payer: Self-pay | Admitting: Family Medicine

## 2014-05-28 ENCOUNTER — Other Ambulatory Visit: Payer: Self-pay | Admitting: Family Medicine

## 2014-07-16 ENCOUNTER — Other Ambulatory Visit: Payer: Self-pay | Admitting: *Deleted

## 2014-07-16 MED ORDER — ZOLPIDEM TARTRATE 5 MG PO TABS: ORAL_TABLET | ORAL | Status: DC

## 2014-08-20 ENCOUNTER — Other Ambulatory Visit: Payer: Self-pay | Admitting: Physical Medicine & Rehabilitation

## 2014-08-20 ENCOUNTER — Other Ambulatory Visit: Payer: Self-pay | Admitting: *Deleted

## 2014-08-20 MED ORDER — TRAMADOL HCL 50 MG PO TABS: 50.0000 mg | ORAL_TABLET | Freq: Three times a day (TID) | ORAL | Status: DC

## 2014-08-20 NOTE — Telephone Encounter (Signed)
Recd electronic refill request for Tramadol 50 mg tablets - take one tablet TID - #90 #RF 2.  Called in

## 2014-09-17 ENCOUNTER — Encounter: Payer: Self-pay | Admitting: Physical Medicine & Rehabilitation

## 2014-09-17 ENCOUNTER — Ambulatory Visit (HOSPITAL_BASED_OUTPATIENT_CLINIC_OR_DEPARTMENT_OTHER): Payer: Medicare Other | Admitting: Physical Medicine & Rehabilitation

## 2014-09-17 ENCOUNTER — Encounter: Payer: Medicare Other | Attending: Physical Medicine & Rehabilitation

## 2014-09-17 VITALS — BP 120/63 | HR 81 | Resp 14

## 2014-09-17 DIAGNOSIS — M961 Postlaminectomy syndrome, not elsewhere classified: Secondary | ICD-10-CM | POA: Insufficient documentation

## 2014-09-17 DIAGNOSIS — M4155 Other secondary scoliosis, thoracolumbar region: Secondary | ICD-10-CM

## 2014-09-17 NOTE — Progress Notes (Signed)
Subjective:    Patient ID: Ronald Strong, male    DOB: 1921-03-07, 81 y.o.   MRN: [id number]  HPI Pt relatively pain free when at rest.  Has had massage therapy, this is once a week for about 5 weeks.  Feels like this is helping, therapist working on low back And neck.  Does walking for exercise, still does 3-4 exercises from PT 3 times a week.   Taking tramadol once a night. Did not fill the prescription from January yet.Happy to be off of hydrocodone.Pain Inventory Average Pain 7 Pain Right Now 6 My pain is sharp and burning  In the last 24 hours, has pain interfered with the following? General activity 6 Relation with others 7 Enjoyment of life 8 What TIME of day is your pain at its worst? morning, daytime, evening Sleep (in general) Fair  Pain is worse with: walking, bending and standing Pain improves with: rest, heat/ice, therapy/exercise and medication Relief from Meds: 5  Mobility walk without assistance how many minutes can you walk? 15 ability to climb steps?  yes do you drive?  yes Do you have any goals in this area?  yes  Function retired Do you have any goals in this area?  yes  Neuro/Psych bladder control problems trouble walking depression anxiety  Prior Studies Any changes since last visit?  no  Physicians involved in your care Any changes since last visit?  no   Family History  Problem Relation Age of Onset  . Arthritis Other   . Heart disease Other   . Hypertension Other    History   Social History  . Marital Status: Married    Spouse Name: N/A  . Number of Children: N/A  . Years of Education: N/A   Social History Main Topics  . Smoking status: Former Smoker    Quit date: 06/04/1983  . Smokeless tobacco: Not on file  . Alcohol Use: Yes  . Drug Use: No  . Sexual Activity: Not on file   Other Topics Concern  . None   Social History Narrative   Past Surgical History  Procedure Laterality Date  . Total hip arthroplasty  09/21/06     right  . Other surgical history  09/01/10    spine decompression  . Spine surgery  09/11/10  . Prostate surgery  01/28/13    laser   Past Medical History  Diagnosis Date  . Arthritis   . Depression   . Chicken pox   . Diverticulitis   . Allergy   . Arrhythmia   . Hypertension   . Colon polyps   . Thyroid disease    BP 120/63 mmHg  Pulse 81  Resp 14  SpO2 99%  Opioid Risk Score:   Fall Risk Score: Moderate Fall Risk (6-13 points)`1  Depression screen PHQ 2/9  Depression screen PHQ 2/9 06/03/2013  Decreased Interest 0  Down, Depressed, Hopeless 0  PHQ - 2 Score 0     Review of Systems  Genitourinary: Positive for urgency and frequency.       Retention  Musculoskeletal: Positive for gait problem.  Psychiatric/Behavioral: Positive for dysphoric mood. The patient is nervous/anxious.   All other systems reviewed and are negative.      Objective:   Physical Exam  Constitutional: He is oriented to person, place, and time. He appears well-developed and well-nourished.  HENT:  Head: Normocephalic and atraumatic.  Eyes: Conjunctivae and EOM are normal. Pupils are equal, round, and reactive to light.  Neck: Normal range of motion.  Musculoskeletal:       Lumbar back: He exhibits decreased range of motion. He exhibits no tenderness.  Negative straight leg raising Lumbar range of motion is reduced with flexion extension lateral bending and rotation, flexion is at 50% extension is 0-25%Extension is more painful as well.  Neurological: He is alert and oriented to person, place, and time.  Psychiatric: He has a normal mood and affect.  Nursing note and vitals reviewed.  Motor strength is 5/5 bilateral hip flexor and knee extensor and ankle dorsal flexion plantar flexor       Assessment & Plan:  1. Lumbar postlaminectomy syndrome status post lumbar decompression 12/13/2011.  Overall  Has improved after physical therapy sessions. Have encouraged the patient continues  home exercise program He is benefiting from massage he will continue this for a while then he would like to trial some acupuncture at [facility] Benefiting from tramadol as well. Takes one in am, then takes an ASA,then one in pm Return to clinic 6 months

## 2014-09-17 NOTE — Patient Instructions (Signed)
Keep up with PT exercises and walking, try putting

## 2014-10-25 ENCOUNTER — Other Ambulatory Visit: Payer: Self-pay | Admitting: Family Medicine

## 2015-01-19 ENCOUNTER — Other Ambulatory Visit: Payer: Self-pay | Admitting: Family Medicine

## 2015-02-02 ENCOUNTER — Other Ambulatory Visit: Payer: Self-pay | Admitting: *Deleted

## 2015-02-02 MED ORDER — ZOLPIDEM TARTRATE 5 MG PO TABS: ORAL_TABLET | ORAL | Status: DC

## 2015-02-09 ENCOUNTER — Ambulatory Visit (INDEPENDENT_AMBULATORY_CARE_PROVIDER_SITE_OTHER): Payer: Medicare Other | Admitting: Family Medicine

## 2015-02-09 DIAGNOSIS — Z23 Encounter for immunization: Secondary | ICD-10-CM

## 2015-03-15 DIAGNOSIS — E782 Mixed hyperlipidemia: Secondary | ICD-10-CM | POA: Diagnosis not present

## 2015-03-15 DIAGNOSIS — I471 Supraventricular tachycardia: Secondary | ICD-10-CM | POA: Diagnosis not present

## 2015-03-15 DIAGNOSIS — I10 Essential (primary) hypertension: Secondary | ICD-10-CM | POA: Diagnosis not present

## 2015-03-19 ENCOUNTER — Encounter: Payer: Medicare Other | Attending: Physical Medicine & Rehabilitation

## 2015-03-19 ENCOUNTER — Ambulatory Visit: Payer: Medicare Other | Admitting: Physical Medicine & Rehabilitation

## 2015-03-29 ENCOUNTER — Ambulatory Visit (INDEPENDENT_AMBULATORY_CARE_PROVIDER_SITE_OTHER): Payer: Medicare Other | Admitting: Family Medicine

## 2015-03-29 ENCOUNTER — Encounter: Payer: Self-pay | Admitting: Family Medicine

## 2015-03-29 VITALS — BP 120/70 | Temp 98.2°F | Wt 165.0 lb

## 2015-03-29 DIAGNOSIS — M5410 Radiculopathy, site unspecified: Secondary | ICD-10-CM

## 2015-03-29 DIAGNOSIS — F32A Depression, unspecified: Secondary | ICD-10-CM | POA: Insufficient documentation

## 2015-03-29 DIAGNOSIS — M5416 Radiculopathy, lumbar region: Principal | ICD-10-CM

## 2015-03-29 DIAGNOSIS — R000 Tachycardia, unspecified: Secondary | ICD-10-CM

## 2015-03-29 DIAGNOSIS — G8929 Other chronic pain: Secondary | ICD-10-CM

## 2015-03-29 DIAGNOSIS — F329 Major depressive disorder, single episode, unspecified: Secondary | ICD-10-CM | POA: Diagnosis not present

## 2015-03-29 MED ORDER — CITALOPRAM HYDROBROMIDE 10 MG PO TABS: 10.0000 mg | ORAL_TABLET | Freq: Every day | ORAL | Status: DC

## 2015-03-29 NOTE — Progress Notes (Signed)
   Subjective:    Patient ID: Ronald Strong, male    DOB: 01/12/1921, 81 y.o.   MRN: [id number]  HPI Ronald Strong is a 81 year old married male nonsmoker who comes in today accompanied by his daughter Ronald Strong who is his primary caregiver for evaluation of depression  He tells me that his back pain is prohibiting him from doing what he wants to do. He's currently trying acupuncture and tramadol 50 mg twice a day  He saw his cardiologist in the [place] [facility] 2 weeks ago. Her neck evaluation is negative no change medication. He takes verapamil 120 mg daily to prevent A. fib. Hent episode 2 months ago that resolved spontaneously  He also feels depressed. He and his wife used to live independently in [place]. Now for the past couple years a been here in [place] living with her daughter. He has low mood but no suicidal ideations   Review of Systems    review of systems negative no history of previous depression Objective:   Physical Exam  Well-developed well-nourished male no acute distress vital signs stable he is afebrile he is oriented 3 days awake alert appropriate      Assessment & Plan:  Mild depression....................Marland Kitchen

## 2015-03-29 NOTE — Progress Notes (Signed)
Pre visit review using our clinic review tool, if applicable. No additional management support is needed unless otherwise documented below in the visit note. 

## 2015-03-29 NOTE — Patient Instructions (Signed)
Celexa 10 mg.......... one daily at bedtime  Walk 30 minutes daily outside in the afternoon  Return the second week in January for follow-up

## 2015-04-06 ENCOUNTER — Encounter: Payer: Medicare Other | Attending: Physical Medicine & Rehabilitation

## 2015-04-06 ENCOUNTER — Encounter: Payer: Self-pay | Admitting: Physical Medicine & Rehabilitation

## 2015-04-06 ENCOUNTER — Ambulatory Visit (HOSPITAL_BASED_OUTPATIENT_CLINIC_OR_DEPARTMENT_OTHER): Payer: Medicare Other | Admitting: Physical Medicine & Rehabilitation

## 2015-04-06 VITALS — BP 131/65 | HR 78 | Resp 14

## 2015-04-06 DIAGNOSIS — M4155 Other secondary scoliosis, thoracolumbar region: Secondary | ICD-10-CM | POA: Insufficient documentation

## 2015-04-06 DIAGNOSIS — M961 Postlaminectomy syndrome, not elsewhere classified: Secondary | ICD-10-CM | POA: Insufficient documentation

## 2015-04-06 MED ORDER — TRAMADOL HCL 50 MG PO TABS: 50.0000 mg | ORAL_TABLET | Freq: Two times a day (BID) | ORAL | Status: DC

## 2015-04-06 NOTE — Patient Instructions (Signed)

## 2015-04-06 NOTE — Progress Notes (Signed)
Subjective:    Patient ID: Ronald Strong, male    DOB: Jun 19, 1920, 81 y.o.   MRN: [id number]  HPI  Acupuncturist Allayne Gitelman, weekly, not electro acupuncture, $70 per 45 min visit Takes tramadol BID. Not walking for exercise Tried exercise bike "doesn't do much for me" Does stretching 30-2min every other day Pain Inventory Average Pain 9 Pain Right Now 9 My pain is sharp and burning  In the last 24 hours, has pain interfered with the following? General activity 8 Relation with others 9 Enjoyment of life 10 What TIME of day is your pain at its worst? morning, daytime, evening Sleep (in general) Fair  Pain is worse with: walking, bending and standing Pain improves with: rest, medication and TENS Relief from Meds: 6  Mobility walk without assistance how many minutes can you walk? 10 ability to climb steps?  yes Do you have any goals in this area?  yes  Function not employed: date last employed NA  Neuro/Psych trouble walking depression  Prior Studies Any changes since last visit?  no  Physicians involved in your care Any changes since last visit?  no   Family History  Problem Relation Age of Onset  . Arthritis Other   . Heart disease Other   . Hypertension Other    Social History   Social History  . Marital Status: Married    Spouse Name: N/A  . Number of Children: N/A  . Years of Education: N/A   Social History Main Topics  . Smoking status: Former Smoker    Quit date: 06/04/1983  . Smokeless tobacco: None  . Alcohol Use: Yes  . Drug Use: No  . Sexual Activity: Not Asked   Other Topics Concern  . None   Social History Narrative   Past Surgical History  Procedure Laterality Date  . Total hip arthroplasty  09/21/06    right  . Other surgical history  09/01/10    spine decompression  . Spine surgery  09/11/10  . Prostate surgery  01/28/13    laser   Past Medical History  Diagnosis Date  . Arthritis   . Depression   . Chicken pox   .  Diverticulitis   . Allergy   . Arrhythmia   . Hypertension   . Colon polyps   . Thyroid disease    BP 131/65 mmHg  Pulse 78  Resp 14  SpO2 96%  Opioid Risk Score:   Fall Risk Score:  `1  Depression screen PHQ 2/9  Depression screen PHQ 2/9 06/03/2013  Decreased Interest 0  Down, Depressed, Hopeless 0  PHQ - 2 Score 0      Review of Systems  Musculoskeletal: Positive for gait problem.  Psychiatric/Behavioral: Positive for dysphoric mood.  All other systems reviewed and are negative.      Objective:   Physical Exam  No tenderness to palpation in the lumbar paraspinal muscles. Healed midline lumbar incision. Dextroconvex scoliosis thoracolumbar Able to touch knees with forward flexion, No lumbar extension or thoracic extension Decreased left hip internal/external rotation Good range of motion right hip internal/external rotation Normal range of motion bilateral knees and ankles 5/5 strength in hip flexors knee extensors ankle dorsiflexors    Assessment & Plan:  1. Thoracic thoracolumbar scoliosis no evidence of neurogenic claudication. Overall is remaining functional independent with all dressing and bathing grooming and driving. Using only 2 tramadol a day. Reduced prescription to 60 tablets per month Recommend continue acupuncture Printed out back exercises. He  will not be doing the back extension exercises she is unable to do.  Return to clinic in 6 months

## 2015-04-07 ENCOUNTER — Ambulatory Visit: Payer: No Typology Code available for payment source | Admitting: Family Medicine

## 2015-04-18 ENCOUNTER — Other Ambulatory Visit: Payer: Self-pay | Admitting: Family Medicine

## 2015-05-11 ENCOUNTER — Encounter: Payer: Self-pay | Admitting: Family Medicine

## 2015-05-11 ENCOUNTER — Ambulatory Visit (INDEPENDENT_AMBULATORY_CARE_PROVIDER_SITE_OTHER): Payer: Medicare Other | Admitting: Family Medicine

## 2015-05-11 VITALS — BP 120/70 | Temp 98.7°F | Wt 163.0 lb

## 2015-05-11 DIAGNOSIS — M5410 Radiculopathy, site unspecified: Secondary | ICD-10-CM

## 2015-05-11 DIAGNOSIS — M5416 Radiculopathy, lumbar region: Principal | ICD-10-CM

## 2015-05-11 DIAGNOSIS — F329 Major depressive disorder, single episode, unspecified: Secondary | ICD-10-CM

## 2015-05-11 DIAGNOSIS — G8929 Other chronic pain: Secondary | ICD-10-CM

## 2015-05-11 DIAGNOSIS — I10 Essential (primary) hypertension: Secondary | ICD-10-CM | POA: Diagnosis not present

## 2015-05-11 DIAGNOSIS — F32A Depression, unspecified: Secondary | ICD-10-CM

## 2015-05-11 MED ORDER — TAMSULOSIN HCL 0.4 MG PO CAPS: 0.4000 mg | ORAL_CAPSULE | Freq: Every morning | ORAL | Status: DC

## 2015-05-11 MED ORDER — TRIAMTERENE-HCTZ 37.5-25 MG PO TABS: ORAL_TABLET | ORAL | Status: DC

## 2015-05-11 MED ORDER — VERAPAMIL HCL 120 MG PO TABS: 120.0000 mg | ORAL_TABLET | Freq: Every morning | ORAL | Status: DC

## 2015-05-11 MED ORDER — LEVOTHYROXINE SODIUM 125 MCG PO TABS: ORAL_TABLET | ORAL | Status: DC

## 2015-05-11 NOTE — Progress Notes (Signed)
Pre visit review using our clinic review tool, if applicable. No additional management support is needed unless otherwise documented below in the visit note. 

## 2015-05-11 NOTE — Patient Instructions (Signed)
Continue current medications. Hold the Celexa just in case she begin to feel pressed again  I refilled all your medications for year

## 2015-05-11 NOTE — Progress Notes (Signed)
   Subjective:    Patient ID: Ronald Strong, male    DOB: 16-Dec-1920, 82 y.o.   MRN: [id number]  HPI Ronald Strong is a 82 year old married male nonsmoker who comes in today for follow-up of depression  We saw about a month ago. He had symptoms of depression with low mood and sadness and negativity. We started Celexa 10 mg daily. He took 1 tablet at bedtime for a week felt better and stopped it. He says moods fine and he feels okay. Other medications are unchanged. Blood pressure today 120/70 on Maxide 25 mg daily and Kalynn 120 mg daily  He continues to take Ambien a half of a 5 mg tablet at bedtime for sleep  He takes tramadol 50 mg twice a day for chronic back pain. He's also getting acupuncture.   Review of Systems Review of systems otherwise negative    Objective:   Physical Exam  Well-developed well-nourished male no acute distress vital signs stable he is afebrile oriented 3 mood good smiling happy      Assessment & Plan:  Depression resolved after 10 mg of Celexa at bedtime for a week.....Marland Kitchen content to stay off his medication  Hypertension ago....... continue current therapy  Chronic back pain........ continue tramadol and acupuncture........Marland Kitchen

## 2015-05-15 ENCOUNTER — Other Ambulatory Visit: Payer: Self-pay | Admitting: Family Medicine

## 2015-05-17 ENCOUNTER — Other Ambulatory Visit: Payer: Self-pay | Admitting: Family Medicine

## 2015-05-17 DIAGNOSIS — R269 Unspecified abnormalities of gait and mobility: Secondary | ICD-10-CM

## 2015-05-17 DIAGNOSIS — M4155 Other secondary scoliosis, thoracolumbar region: Secondary | ICD-10-CM

## 2015-05-25 ENCOUNTER — Ambulatory Visit: Payer: Medicare Other | Attending: Family Medicine | Admitting: Physical Therapy

## 2015-05-25 DIAGNOSIS — R29898 Other symptoms and signs involving the musculoskeletal system: Secondary | ICD-10-CM | POA: Insufficient documentation

## 2015-05-25 DIAGNOSIS — R269 Unspecified abnormalities of gait and mobility: Secondary | ICD-10-CM | POA: Diagnosis not present

## 2015-05-25 DIAGNOSIS — M25651 Stiffness of right hip, not elsewhere classified: Secondary | ICD-10-CM | POA: Insufficient documentation

## 2015-05-25 DIAGNOSIS — M545 Low back pain, unspecified: Secondary | ICD-10-CM

## 2015-05-25 NOTE — Therapy (Signed)
[facility] [address] [place], [place], [address] Phone: [phone]   Fax:  [phone]  Physical Therapy Evaluation  Patient Details  Name: Ronald Strong MRN: [id number] Date of Birth: 11/05/1920 No Data Recorded  Encounter Date: 05/25/2015      PT End of Session - 05/25/15 2022    Visit Number 1   Number of Visits 10   Date for PT Re-Evaluation 07/20/15   Authorization Type Medicare G codes; Kx at 15   PT Start Time [id number]   PT Stop Time 1530   PT Time Calculation (min) 45 min   Activity Tolerance Patient tolerated treatment well      Past Medical History  Diagnosis Date  . Arthritis   . Depression   . Chicken pox   . Diverticulitis   . Allergy   . Arrhythmia   . Hypertension   . Colon polyps   . Thyroid disease     Past Surgical History  Procedure Laterality Date  . Total hip arthroplasty  09/21/06    right  . Other surgical history  09/01/10    spine decompression  . Spine surgery  09/11/10  . Prostate surgery  01/28/13    laser    There were no vitals filed for this visit.  Visit Diagnosis:  Abnormality of gait - Plan: PT plan of care cert/re-cert  Bilateral low back pain without sciatica - Plan: PT plan of care cert/re-cert  Weakness of both lower extremities - Plan: PT plan of care cert/re-cert  Hip stiffness, right - Plan: PT plan of care cert/re-cert      Subjective Assessment - 05/25/15 1453    Subjective Presents with wife; LBP chronic;  few years ago laminectomy with minimal improvement; doesn't hurt with sitting or lying down;  doing acupuncture which helps short term.  Better in the AM but worse as the day goes on.  Had injections few years ago with no relief.  Had PT 4-5 years ago with no relief (stretching).  Haven't used a cane or walker at this point.  Able to push shopping cart, even the lawnmower.     Limitations Walking;Lifting;Standing   How long can you sit comfortably?  as long as I want   How long can you stand comfortably? long enough to shave   How long can you walk comfortably? pain immediately upon standing,    Diagnostic tests none recently   Patient Stated Goals walk;     Currently in Pain? Yes   Pain Score 0-No pain   Pain Location Back   Pain Orientation Right;Left;Mid   Pain Type Chronic pain   Pain Onset More than a month ago   Pain Frequency Intermittent   Aggravating Factors  walking, standing, bending over,    Pain Relieving Factors pain medication Tramodol, sitting, heat, TENS (has one at home but doesn't use often); alcohol            [facility] PT Assessment - 05/25/15 1501    Assessment   Medical Diagnosis scoliosis, thoracolumbar region, gait difficulty   Onset Date/Surgical Date --  6 months   Hand Dominance Right   Next MD Visit not scheduled   Prior Therapy 4-5 years ago   Precautions   Precautions None   Restrictions   Weight Bearing Restrictions No   Balance Screen   Has the patient fallen in the past 6 months Yes   How many times? 1  deck steps  Has the patient had a decrease in activity level because of a fear of falling?  No   Is the patient reluctant to leave their home because of a fear of falling?  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    Leisure family, daughter   Observation/Other Assessments   Focus on Therapeutic Outcomes (FOTO)  not captured   Posture/Postural Control   Posture/Postural Control Postural limitations    Postural Limitations Rounded Shoulders;Increased thoracic kyphosis;Flexed trunk   Posture Comments 5-10 upon first standing, increases to 45 degrees with fatigue   ROM / Strength   AROM / PROM / Strength AROM   AROM   AROM Assessment Site Hip;Lumbar   Right/Left Hip Right;Left   Right Hip Extension 0   Right Hip Flexion 10   Right Hip External Rotation  18   Left Hip Extension 0   Left Hip External Rotation  20   Left Hip Internal Rotation  20   Lumbar Flexion WFLs   Lumbar Extension 0   Lumbar - Right Side Bend WFLs   Lumbar - Left Side Bend WFLs   Strength   Right/Left Hip Right;Left   Right Hip Extension 4-/5   Right Hip ABduction 3+/5   Left Hip Extension 4-/5   Left Hip ADduction 4-/5   Lumbar Flexion 3+/5   Lumbar Extension 3-/5   Flexibility   Soft Tissue Assessment /Muscle Length yes   Hamstrings 45 B   Quadriceps decreased hip flexor lengths B   Palpation   Palpation comment mild tender right lumbar paraspinals, right gluteals, piriformis   Special Tests   Lumbar Tests Slump Test;Straight Leg Raise   Slump test   Findings Negative   Straight Leg Raise   Findings Negative                           PT Education - 05/25/15 2022    Education provided Yes   Education Details ab brace, ab brace with hand /knee push   Person(s) Educated Patient   Methods Explanation;Demonstration;Handout   Comprehension Verbalized understanding;Returned demonstration          PT Short Term Goals - 05/25/15 2034    PT SHORT TERM GOAL #1   Title The patient will have a knowledge of basic self care including use of RW for pain control and more upright walking to the mailbox and in the community, regular use of heat, home TENs unit for pain control  06/22/15   Time 4   Period Weeks   Status New   PT SHORT TERM GOAL #2   Title The patient will improved HS length to 60 degrees and hip extension to 5 degrees needed for more upright posture and increased stride  length with ambulation   Time 4   Period Weeks   Status New   PT SHORT TERM GOAL #3   Title The patient will report a 25% improvement  in pain and function with errands,  assisting wife   Time 4   Period Weeks   Status New           PT Long Term Goals - June 18, 2015 08/25/2035    PT LONG TERM GOAL #1   Title The patient will be independent in safe self progression of HEP for further improvements in ROM, strength, pain and function 07/20/15   Time 8   Period Weeks   Status New   PT LONG TERM GOAL #2   Title The patient will have improved trunk/core strength to 3+/5 needed for standing/walking longer periods of time at home and in the community with decreased forward trunk lean   Time 8   Period Weeks   Status New   PT LONG TERM GOAL #3   Title Bilateral hip strength improved to 4/5 needed for standing/walking home and community distances   Time 8   Period Weeks   Status New   PT LONG TERM GOAL #4   Title improve lumbar extension to neutral  for improved posture in standing    Time 8   Period Weeks   Status New               Plan - 06/18/2015 2021/08/24    Clinical Impression Statement The patient is a 82 year old who presents for moderately complex evaluation for chronic LBP.  He has a history of lumbar laminectomy and right THR.  He has had multiple treatment interventions including injections with no relief and short term relief with acupuncture.  No PT in 4-5 years.  He has stenosis-like symptoms with pain with standing and walking, no pain sitting or lying down.  He is very limited in walking distance of just a few minutes before his back pain becomes severe and he is unable stand upright.  As pain increases he stands bent forward 45 degrees.  (Before fatigue he can stand nearly erect with 5-10 degree forward lean.  Decreased core/trunk strength as well as hip strength bilaterally.  Tenderness right lumbar paraspinals, piriformis. gluteals.  He would benefit from PT to address pain and  these deficits affecting his quality of life and assisting his wife who is disabled as well.     Pt will benefit from skilled therapeutic intervention in order to improve on the following deficits Decreased activity tolerance;Pain;Increased muscle spasms;Decreased strength;Decreased endurance;Decreased range of motion;Hypomobility;Difficulty walking;Impaired flexibility;Postural dysfunction   Rehab Potential Good   Clinical Impairments Affecting Rehab Potential right THR, advanced age   PT Frequency 2x / week   PT Duration 8 weeks   PT Treatment/Interventions ADLs/Self Care Home Management;Cryotherapy;Electrical Stimulation;Ultrasound;Moist Heat;Therapeutic exercise;Patient/family education;Manual techniques;Dry needling;Taping   PT Next Visit Plan check gait speed with 35m or TUG; HS supine and sidelying hip flexor stretching;  abdominal brace series; clams; hip isometrics; prone over 1-2 pillows for multifidi activation press;  possible dry needling of lumbar multifidi, right gluteals, piriformis   PT Home Exercise Plan supine abdominal brace; ab brace with hand to knee push   Recommended Other Services recommend RW (wife has 2) for walking to the mailbox and in community;  use of home TENS for pain control          G-Codes - 18-Jun-2015 2041/08/24    Functional Assessment Tool Used clinical judgement   Functional Limitation Mobility: Walking and moving around   Mobility: Walking and Moving Around Current Status [id number]) At least 60 percent but less than 80 percent impaired, limited or restricted  Mobility: Walking and Moving Around Goal Status [phone]) At least 40 percent but less than 60 percent impaired, limited or restricted       Problem List Patient Active Problem List   Diagnosis Date Noted  . Depression 03/29/2015  . Postlaminectomy syndrome, lumbar region 01/09/2014  . Other secondary scoliosis, thoracolumbar region 01/09/2014  . Viral URI with cough 12/16/2013  . Rapid heart rate  08/05/2013  . Chronic radicular low back pain 06/03/2013  . Unspecified hypothyroidism 06/03/2013  . Accelerated hypertension 06/03/2013  . Insomnia 06/03/2013  . BPH without urinary obstruction 06/03/2013    Alvera Singh 05/25/2015, 8:46 PM  [facility] [address] [place], [place], [address] Phone: [phone]   Fax:  [phone]  Name: Ronald Strong MRN: [id number] Date of Birth: 1920/05/09   Ruben Im, PT 05/25/2015 8:46 PM Phone: [phone] Fax: [phone]

## 2015-06-02 ENCOUNTER — Ambulatory Visit: Payer: Medicare Other | Attending: Family Medicine | Admitting: Physical Therapy

## 2015-06-02 ENCOUNTER — Encounter: Payer: Self-pay | Admitting: Physical Therapy

## 2015-06-02 DIAGNOSIS — M545 Low back pain, unspecified: Secondary | ICD-10-CM

## 2015-06-02 DIAGNOSIS — R29898 Other symptoms and signs involving the musculoskeletal system: Secondary | ICD-10-CM | POA: Diagnosis not present

## 2015-06-02 DIAGNOSIS — M25651 Stiffness of right hip, not elsewhere classified: Secondary | ICD-10-CM | POA: Insufficient documentation

## 2015-06-02 DIAGNOSIS — R269 Unspecified abnormalities of gait and mobility: Secondary | ICD-10-CM | POA: Insufficient documentation

## 2015-06-02 NOTE — Therapy (Signed)
[facility] [address] [place], [place], [address] Phone: [phone]   Fax:  [phone]  Physical Therapy Treatment  Patient Details  Name: Ronald Strong MRN: [id number] Date of Birth: August 04, 1920 No Data Recorded  Encounter Date: 06/02/2015      PT End of Session - 06/02/15 1256    Visit Number 2   Number of Visits 10   Date for PT Re-Evaluation 07/20/15   Authorization Type Medicare G codes; Kx at 15   PT Start Time 1232   PT Stop Time 1314   PT Time Calculation (min) 42 min   Activity Tolerance Patient tolerated treatment well   Behavior During Therapy [place] for tasks assessed/performed      Past Medical History  Diagnosis Date  . Arthritis   . Depression   . Chicken pox   . Diverticulitis   . Allergy   . Arrhythmia   . Hypertension   . Colon polyps   . Thyroid disease     Past Surgical History  Procedure Laterality Date  . Total hip arthroplasty  09/21/06    right  . Other surgical history  09/01/10    spine decompression  . Spine surgery  09/11/10  . Prostate surgery  01/28/13    laser    There were no vitals filed for this visit.  Visit Diagnosis:  Abnormality of gait  Bilateral low back pain without sciatica  Weakness of both lower extremities  Hip stiffness, right      Subjective Assessment - 06/02/15 1238    Subjective Pt had only evaluation no changes to be expected. He reports he has many stairs in his house and has to negotiate it 3-4 times a day.   Pertinent History Few years ago laminectomy.    Limitations Walking;Lifting;Standing   How long can you sit comfortably? as long as I want   How long can you stand comfortably? long enough to shave   How long can you walk comfortably? pain immediately upon standing,    Diagnostic tests none recently   Patient Stated Goals walk;     Currently in Pain? Yes   Pain Score 2   up to 8/10   Pain Location Back   Pain Orientation  Right;Left;Mid   Pain Type Chronic pain   Pain Onset More than a month ago   Pain Frequency Intermittent   Aggravating Factors  walking standing, bending over   Pain Relieving Factors pain meds Tramadol, sitting, heat, TENS   Multiple Pain Sites No                         [facility] Adult PT Treatment/Exercise - 06/02/15 0001    Posture/Postural Control   Posture/Postural Control Postural limitations   Postural Limitations Rounded Shoulders;Increased thoracic kyphosis;Flexed trunk   Posture Comments 5-10 upon first standing, increases to 45 degrees with fatigue   Balance   Balance Assessed Yes   Standardized Balance Assessment   Standardized Balance Assessment Timed Up and Go Test   Timed Up and Go Test   TUG Normal TUG   Normal TUG (seconds) --  13, 14, 12 sec from chair without armrest no UE support   Exercises   Exercises Lumbar;Knee/Hip   Lumbar Exercises: Stretches   Active Hamstring Stretch 3 reps;20 seconds  in sitting   Single Knee to Chest Stretch 3 reps;20 seconds  each side   Double Knee to Chest Stretch 3 reps;20  seconds   Lower Trunk Rotation 3 reps;20 seconds  pillow on Rt side due to prevent discomfort   Lumbar Exercises: Seated   Other Seated Lumbar Exercises bil hip abduction with red t-band 2 x10    Other Seated Lumbar Exercises abdominal strength with yellow ball Lt/Rt x 10 each side   Modalities   Modalities Moist Heat   Moist Heat Therapy   Number Minutes Moist Heat 15 Minutes   Moist Heat Location Lumbar Spine  on HMP while performing stretching exercises                PT Education - 06/02/15 1254    Education provided Yes   Education Details SKC, [place], trunk rotation, HS stretch in sitting   Person(s) Educated Patient   Methods Explanation;Demonstration;Handout   Comprehension Verbalized understanding;Returned demonstration          PT Short Term Goals - 05/25/15 2034    PT SHORT TERM GOAL #1   Title The patient will  have a knowledge of basic self care including use of RW for pain control and more upright walking to the mailbox and in the community, regular use of heat, home TENs unit for pain control  06/22/15   Time 4   Period Weeks   Status New   PT SHORT TERM GOAL #2   Title The patient will improved HS length to 60 degrees and hip extension to 5 degrees needed for more upright posture and increased stride length with ambulation   Time 4   Period Weeks   Status New   PT SHORT TERM GOAL #3   Title The patient will report a 25% improvement in pain and function with errands,  assisting wife   Time 4   Period Weeks   Status New           PT Long Term Goals - 05/25/15 2037    PT LONG TERM GOAL #1   Title The patient will be independent in safe self progression of HEP for further improvements in ROM, strength, pain and function 07/20/15   Time 8   Period Weeks   Status New   PT LONG TERM GOAL #2   Title The patient will have improved trunk/core strength to 3+/5 needed for standing/walking longer periods of time at home and in the community with decreased forward trunk lean   Time 8   Period Weeks   Status New   PT LONG TERM GOAL #3   Title Bilateral hip strength improved to 4/5 needed for standing/walking home and community distances   Time 8   Period Weeks   Status New   PT LONG TERM GOAL #4   Title improve lumbar extension to neutral  for improved posture in standing    Time 8   Period Weeks   Status New               Plan - 06/02/15 1256    Clinical Impression Statement Pt with chronic low back pain and is instructed in gentle low back and Hamstring stretching exercises to help control pain and improve flexibility. Pt will continue to benefit from skilled PT to address flexibility and core strength.     Pt will benefit from skilled therapeutic intervention in order to improve on the following deficits Decreased activity tolerance;Pain;Increased muscle spasms;Decreased  strength;Decreased endurance;Decreased range of motion;Hypomobility;Difficulty walking;Impaired flexibility;Postural dysfunction   Rehab Potential Good   Clinical Impairments Affecting Rehab Potential right THR, advanced age  PT Frequency 2x / week   PT Duration 8 weeks   PT Treatment/Interventions ADLs/Self Care Home Management;Cryotherapy;Electrical Stimulation;Ultrasound;Moist Heat;Therapeutic exercise;Patient/family education;Manual techniques;Dry needling;Taping   PT Next Visit Plan HS in sitting, sidelying hip flexor stretching;  abdominal brace series; clams; hip isometrics; prone over 1-2 pillows for multifidi activation press;  possible dry needling of lumbar multifidi, right gluteals, piriformis   PT Home Exercise Plan SKC, BKC, LTR, hamstring stretch in sitting,    Consulted and Agree with Plan of Care Patient        Problem List Patient Active Problem List   Diagnosis Date Noted  . Depression 03/29/2015  . Postlaminectomy syndrome, lumbar region 01/09/2014  . Other secondary scoliosis, thoracolumbar region 01/09/2014  . Viral URI with cough 12/16/2013  . Rapid heart rate 08/05/2013  . Chronic radicular low back pain 06/03/2013  . Unspecified hypothyroidism 06/03/2013  . Accelerated hypertension 06/03/2013  . Insomnia 06/03/2013  . BPH without urinary obstruction 06/03/2013    NAUMANN-HOUEGNIFIO,Ronald Strong PTA 06/02/2015, 1:21 PM  [facility] [address] [place], [place], [address] Phone: [phone]   Fax:  [phone]  Name: Ronald Strong MRN: [id number] Date of Birth: [birth year]

## 2015-06-02 NOTE — Patient Instructions (Signed)
Perform all exercises below:  Hold _20___ seconds. Repeat _3___ times.  Do __3__ sessions per day. CAUTION: Movement should be gentle, steady and slow.  Knee to Chest  Lying supine, bend involved knee to chest. Perform with each leg.  Copyright  VHI. All rights reserved.  Double Knee to Chest (Flexion)   Gently pull both knees toward chest. Feel stretch in lower back or buttock area. Breathing deeply, Lumbar Rotation: Caudal - Bilateral (Supine)  Feet and knees together, arms outstretched, rotate knees left, turning head in opposite direction, until stretch is felt.      HIP: Hamstrings - Short Sitting   Rest leg on raised surface. Keep knee straight. Lift chest.   [facility] [address], [address] [place], [place] [address] Phone # [phone] Fax [phone] 

## 2015-06-04 ENCOUNTER — Ambulatory Visit: Payer: Medicare Other | Admitting: Physical Therapy

## 2015-06-08 ENCOUNTER — Ambulatory Visit: Payer: Medicare Other | Admitting: Physical Therapy

## 2015-06-10 ENCOUNTER — Encounter: Payer: Medicare Other | Admitting: Physical Therapy

## 2015-06-11 ENCOUNTER — Ambulatory Visit: Payer: Medicare Other | Admitting: Physical Therapy

## 2015-06-11 ENCOUNTER — Encounter: Payer: Self-pay | Admitting: Physical Therapy

## 2015-06-11 DIAGNOSIS — M545 Low back pain, unspecified: Secondary | ICD-10-CM

## 2015-06-11 DIAGNOSIS — M25651 Stiffness of right hip, not elsewhere classified: Secondary | ICD-10-CM | POA: Diagnosis not present

## 2015-06-11 DIAGNOSIS — R269 Unspecified abnormalities of gait and mobility: Secondary | ICD-10-CM

## 2015-06-11 DIAGNOSIS — R29898 Other symptoms and signs involving the musculoskeletal system: Secondary | ICD-10-CM | POA: Diagnosis not present

## 2015-06-11 NOTE — Therapy (Signed)
[facility] [address], [place], [place], [address] Phone: [phone]   Fax:  [phone]  Physical Therapy Treatment  Patient Details  Name: Ronald Strong MRN: [id number] Date of Birth: 01/02/1921 No Data Recorded  Encounter Date: 06/11/2015      PT End of Session - 06/11/15 1034    Visit Number 3   Number of Visits 10  Medicare   Date for PT Re-Evaluation 07/20/15   Authorization Type Medicare G codes; Kx at 15   PT Start Time 1030  patient came 15 min late   PT Stop Time 1100   PT Time Calculation (min) 30 min   Activity Tolerance Patient tolerated treatment well   Behavior During Therapy [facility] for tasks assessed/performed      Past Medical History  Diagnosis Date  . Arthritis   . Depression   . Chicken pox   . Diverticulitis   . Allergy   . Arrhythmia   . Hypertension   . Colon polyps   . Thyroid disease     Past Surgical History  Procedure Laterality Date  . Total hip arthroplasty  09/21/06    right  . Other surgical history  09/01/10    spine decompression  . Spine surgery  09/11/10  . Prostate surgery  01/28/13    laser    There were no vitals filed for this visit.  Visit Diagnosis:  Abnormality of gait  Bilateral low back pain without sciatica  Weakness of both lower extremities  Hip stiffness, right      Subjective Assessment - 06/11/15 1035    Subjective I still have my dizzy spell.  As long I do not jerk my head then I am alright.    Pertinent History Few years ago laminectomy.    Limitations Walking;Lifting;Standing   How long can you sit comfortably? as long as I want   How long can you stand comfortably? long enough to shave   How long can you walk comfortably? pain immediately upon standing,    Diagnostic tests none recently   Patient Stated Goals walk;     Currently in Pain? Yes   Pain Score 7    Pain Location Back   Pain Orientation Right;Mid   Pain Descriptors /  Indicators Sharp   Pain Type Chronic pain   Pain Onset More than a month ago   Pain Frequency Intermittent   Aggravating Factors  standing   Pain Relieving Factors sitting   Multiple Pain Sites No            [facility] PT Assessment - 06/11/15 0001    Flexibility   Soft Tissue Assessment /Muscle Length yes   Hamstrings 45 B                     [facility] Adult PT Treatment/Exercise - 06/11/15 0001    Lumbar Exercises: Stretches   Active Hamstring Stretch 3 reps;20 seconds  in sitting   Single Knee to Chest Stretch 3 reps;20 seconds  each side   Double Knee to Chest Stretch 3 reps;20 seconds   Lower Trunk Rotation 3 reps;20 seconds  pillow on Rt side due to prevent discomfort   Lumbar Exercises: Seated   Other Seated Lumbar Exercises abdominal strength with yellow ball Lt/Rt x 10 each side; bil. shoulder flexion holding yellow ball 2x10 with verbal cues on breathing and posture.   vc to contract abdominals   Knee/Hip Exercises: Aerobic   Nustep  6 min, level 1   Modalities   Modalities Moist Heat   Moist Heat Therapy   Number Minutes Moist Heat 20 Minutes   Moist Heat Location Lumbar Spine  on HMP while performing stretching exercises                  PT Short Term Goals - 06/11/15 1041    PT SHORT TERM GOAL #1   Title The patient will have a knowledge of basic self care including use of RW for pain control and more upright walking to the mailbox and in the community, regular use of heat, home TENs unit for pain control  06/22/15   Time 4   Period Weeks   Status New   PT SHORT TERM GOAL #2   Title The patient will improved HS length to 60 degrees and hip extension to 5 degrees needed for more upright posture and increased stride length with ambulation   Time 4   Period Weeks   Status On-going  no change yet   PT SHORT TERM GOAL #3   Title The patient will report a 25% improvement in pain and function with errands,  assisting wife   Time 4   Period  Weeks   Status On-going           PT Long Term Goals - 05/25/15 2037    PT LONG TERM GOAL #1   Title The patient will be independent in safe self progression of HEP for further improvements in ROM, strength, pain and function 07/20/15   Time 8   Period Weeks   Status New   PT LONG TERM GOAL #2   Title The patient will have improved trunk/core strength to 3+/5 needed for standing/walking longer periods of time at home and in the community with decreased forward trunk lean   Time 8   Period Weeks   Status New   PT LONG TERM GOAL #3   Title Bilateral hip strength improved to 4/5 needed for standing/walking home and community distances   Time 8   Period Weeks   Status New   PT LONG TERM GOAL #4   Title improve lumbar extension to neutral  for improved posture in standing    Time 8   Period Weeks   Status New               Plan - 06/11/15 1052    Clinical Impression Statement Patient has not met goals due to just starting therapy.  Patient has not used the rolling walker because he is not ready to  Therapist explained to patient the walker will help  with his upright posture and walking longer.  Patient has difficulty with dizziness with transitional movements. Patient continues to work on flexibility exercises and trunk strength with skilled therapy.    Pt will benefit from skilled therapeutic intervention in order to improve on the following deficits Decreased activity tolerance;Pain;Increased muscle spasms;Decreased strength;Decreased endurance;Decreased range of motion;Hypomobility;Difficulty walking;Impaired flexibility;Postural dysfunction   Rehab Potential Good   Clinical Impairments Affecting Rehab Potential right THR, advanced age   PT Frequency 2x / week   PT Duration 8 weeks   PT Next Visit Plan HS in sitting, sidelying hip flexor stretching;  abdominal brace series; clams; hip isometrics; prone over 1-2 pillows for multifidi activation press;  possible dry needling  of lumbar multifidi, right gluteals, piriformis   PT Home Exercise Plan progress as needed   Consulted and Agree with Plan of Care  Patient        Problem List Patient Active Problem List   Diagnosis Date Noted  . Depression 03/29/2015  . Postlaminectomy syndrome, lumbar region 01/09/2014  . Other secondary scoliosis, thoracolumbar region 01/09/2014  . Viral URI with cough 12/16/2013  . Rapid heart rate 08/05/2013  . Chronic radicular low back pain 06/03/2013  . Unspecified hypothyroidism 06/03/2013  . Accelerated hypertension 06/03/2013  . Insomnia 06/03/2013  . BPH without urinary obstruction 06/03/2013    Earlie Counts, PT 06/11/2015 10:59 AM    [facility] [address] [place], [place], [address] Phone: [phone]   Fax:  [phone]  Name: Ronald Strong MRN: [id number] Date of Birth: 19-Jun-1920

## 2015-06-14 ENCOUNTER — Ambulatory Visit: Payer: Medicare Other | Admitting: Physical Therapy

## 2015-06-16 ENCOUNTER — Encounter: Payer: Self-pay | Admitting: Physical Therapy

## 2015-06-16 ENCOUNTER — Ambulatory Visit: Payer: Medicare Other | Admitting: Physical Therapy

## 2015-06-16 DIAGNOSIS — R269 Unspecified abnormalities of gait and mobility: Secondary | ICD-10-CM | POA: Diagnosis not present

## 2015-06-16 DIAGNOSIS — M25651 Stiffness of right hip, not elsewhere classified: Secondary | ICD-10-CM | POA: Diagnosis not present

## 2015-06-16 DIAGNOSIS — M545 Low back pain, unspecified: Secondary | ICD-10-CM

## 2015-06-16 DIAGNOSIS — R29898 Other symptoms and signs involving the musculoskeletal system: Secondary | ICD-10-CM

## 2015-06-16 NOTE — Therapy (Signed)
[facility] [address] [place], [place], [address] Phone: [phone]   Fax:  [phone]  Physical Therapy Treatment  Patient Details  Name: Ronald Strong MRN: [id number] Date of Birth: Apr 03, 1921 No Data Recorded  Encounter Date: 06/16/2015      PT End of Session - 06/16/15 1248    Visit Number 4   Number of Visits 10   Date for PT Re-Evaluation 07/20/15   Authorization Type Medicare G codes; Kx at 15   PT Start Time 1230   PT Stop Time 1314   PT Time Calculation (min) 44 min   Activity Tolerance Patient tolerated treatment well   Behavior During Therapy [facility] for tasks assessed/performed      Past Medical History  Diagnosis Date  . Arthritis   . Depression   . Chicken pox   . Diverticulitis   . Allergy   . Arrhythmia   . Hypertension   . Colon polyps   . Thyroid disease     Past Surgical History  Procedure Laterality Date  . Total hip arthroplasty  09/21/06    right  . Other surgical history  09/01/10    spine decompression  . Spine surgery  09/11/10  . Prostate surgery  01/28/13    laser    There were no vitals filed for this visit.  Visit Diagnosis:  Abnormality of gait  Bilateral low back pain without sciatica  Weakness of both lower extremities  Hip stiffness, right      Subjective Assessment - 06/16/15 1243    Subjective My back was hurting after the Monday session. Pt rates his pain as 10/10 with a smile on his face   Pertinent History Few years ago laminectomy.    Limitations Walking;Lifting;Standing   How long can you sit comfortably? as long as I want   How long can you stand comfortably? long enough to shave   How long can you walk comfortably? pain immediately upon standing,    Diagnostic tests none recently   Currently in Pain? Yes   Pain Score 10-Worst pain ever   Pain Location Back   Pain Orientation Right;Mid   Pain Descriptors / Indicators Sharp   Pain Type Chronic  pain   Pain Onset More than a month ago   Pain Frequency Intermittent   Aggravating Factors  standing   Pain Relieving Factors supine and sitting   Multiple Pain Sites No                         [facility] Adult PT Treatment/Exercise - 06/16/15 0001    Exercises   Exercises Lumbar;Knee/Hip   Lumbar Exercises: Stretches   Active Hamstring Stretch 3 reps;20 seconds   Single Knee to Chest Stretch 3 reps;20 seconds   Double Knee to Chest Stretch 3 reps;20 seconds   Lower Trunk Rotation 3 reps;20 seconds  used pillow for ease, but no discomfort   Lumbar Exercises: Seated   Other Seated Lumbar Exercises abdominal strength with yellow ball Lt/Rt x 10 each side; bil. shoulder flexion holding yellow ball 2x10 with verbal cues on breathing and posture.   standing x 10, leaning against wall, unable to perform more   Lumbar Exercises: Sidelying   Clam 20 reps  with yellow t-band, each side   Lumbar Exercises: Prone   Other Prone Lumbar Exercises Gluteus squezzes x 5, curls x 10, hip ext with flexed knee with A by PTA x 10  Modalities   Modalities Moist Heat   Moist Heat Therapy   Number Minutes Moist Heat 20 Minutes   Moist Heat Location Lumbar Spine  on HMP while performin stretching and S/L exercises                  PT Short Term Goals - 06/11/15 1041    PT SHORT TERM GOAL #1   Title The patient will have a knowledge of basic self care including use of RW for pain control and more upright walking to the mailbox and in the community, regular use of heat, home TENs unit for pain control  06/22/15   Time 4   Period Weeks   Status New   PT SHORT TERM GOAL #2   Title The patient will improved HS length to 60 degrees and hip extension to 5 degrees needed for more upright posture and increased stride length with ambulation   Time 4   Period Weeks   Status On-going  no change yet   PT SHORT TERM GOAL #3   Title The patient will report a 25% improvement in pain and  function with errands,  assisting wife   Time 4   Period Weeks   Status On-going           PT Long Term Goals - 05/25/15 2037    PT LONG TERM GOAL #1   Title The patient will be independent in safe self progression of HEP for further improvements in ROM, strength, pain and function 07/20/15   Time 8   Period Weeks   Status New   PT LONG TERM GOAL #2   Title The patient will have improved trunk/core strength to 3+/5 needed for standing/walking longer periods of time at home and in the community with decreased forward trunk lean   Time 8   Period Weeks   Status New   PT LONG TERM GOAL #3   Title Bilateral hip strength improved to 4/5 needed for standing/walking home and community distances   Time 8   Period Weeks   Status New   PT LONG TERM GOAL #4   Title improve lumbar extension to neutral  for improved posture in standing    Time 8   Period Weeks   Status New               Plan - 06/16/15 1249    Clinical Impression Statement Pt with weak abdominal and increased trunk flexion, but not willing to use walking to help wiht posture. Patient with dizziness with transfers sidelying to supine and reverse, but recoveres after rest   Pt will benefit from skilled therapeutic intervention in order to improve on the following deficits Decreased activity tolerance;Pain;Increased muscle spasms;Decreased strength;Decreased endurance;Decreased range of motion;Hypomobility;Difficulty walking;Impaired flexibility;Postural dysfunction   Rehab Potential Good   Clinical Impairments Affecting Rehab Potential right THR, advanced age   PT Frequency 2x / week   PT Duration 8 weeks   PT Treatment/Interventions ADLs/Self Care Home Management;Cryotherapy;Electrical Stimulation;Ultrasound;Moist Heat;Therapeutic exercise;Patient/family education;Manual techniques;Dry needling;Taping   PT Next Visit Plan HS in sitting, sidelying hip flexor stretching;  abdominal brace series; clams; hip isometrics;  prone over 1-2 pillows for multifidi activation press;  possible dry needling of lumbar multifidi, right gluteals, piriformis   PT Home Exercise Plan progress as needed   Consulted and Agree with Plan of Care Patient        Problem List Patient Active Problem List   Diagnosis Date Noted  . Depression  03/29/2015  . Postlaminectomy syndrome, lumbar region 01/09/2014  . Other secondary scoliosis, thoracolumbar region 01/09/2014  . Viral URI with cough 12/16/2013  . Rapid heart rate 08/05/2013  . Chronic radicular low back pain 06/03/2013  . Unspecified hypothyroidism 06/03/2013  . Accelerated hypertension 06/03/2013  . Insomnia 06/03/2013  . BPH without urinary obstruction 06/03/2013    NAUMANN-HOUEGNIFIO,Charissa Knowles PTA 06/16/2015, 1:12 PM  [facility] [address] [place], [place], [address] Phone: [phone]   Fax:  [phone]  Name: Toi Dovell MRN: [id number] Date of Birth: 05-May-1920

## 2015-06-21 ENCOUNTER — Ambulatory Visit: Payer: Medicare Other | Admitting: Physical Therapy

## 2015-06-22 ENCOUNTER — Encounter: Payer: Medicare Other | Admitting: Physical Therapy

## 2015-06-23 ENCOUNTER — Encounter: Payer: Medicare Other | Admitting: Physical Therapy

## 2015-06-24 ENCOUNTER — Ambulatory Visit: Payer: Medicare Other | Admitting: Physical Therapy

## 2015-06-24 ENCOUNTER — Encounter: Payer: Self-pay | Admitting: Physical Therapy

## 2015-06-24 DIAGNOSIS — R269 Unspecified abnormalities of gait and mobility: Secondary | ICD-10-CM | POA: Diagnosis not present

## 2015-06-24 DIAGNOSIS — R29898 Other symptoms and signs involving the musculoskeletal system: Secondary | ICD-10-CM

## 2015-06-24 DIAGNOSIS — M25651 Stiffness of right hip, not elsewhere classified: Secondary | ICD-10-CM | POA: Diagnosis not present

## 2015-06-24 DIAGNOSIS — M545 Low back pain, unspecified: Secondary | ICD-10-CM

## 2015-06-24 NOTE — Therapy (Signed)
[facility] [address] [place], [place], [address] Phone: [phone]   Fax:  [phone]  Physical Therapy Treatment  Patient Details  Name: Ronald Strong MRN: [id number] Date of Birth: [birth year] No Data Recorded  Encounter Date: 06/24/2015      PT End of Session - 06/24/15 1119    Visit Number 5   Number of Visits 10  Medicare   Date for PT Re-Evaluation 07/20/15   Authorization Type Medicare G codes; Kx at 15   PT Start Time 1108   PT Stop Time 1146   PT Time Calculation (min) 38 min   Activity Tolerance Patient tolerated treatment well   Behavior During Therapy [facility] for tasks assessed/performed      Past Medical History  Diagnosis Date  . Arthritis   . Depression   . Chicken pox   . Diverticulitis   . Allergy   . Arrhythmia   . Hypertension   . Colon polyps   . Thyroid disease     Past Surgical History  Procedure Laterality Date  . Total hip arthroplasty  09/21/06    right  . Other surgical history  09/01/10    spine decompression  . Spine surgery  09/11/10  . Prostate surgery  01/28/13    laser    There were no vitals filed for this visit.  Visit Diagnosis:  Abnormality of gait  Bilateral low back pain without sciatica  Weakness of both lower extremities  Hip stiffness, right      Subjective Assessment - 06/24/15 1124    Subjective My back is hurting today.    Pertinent History Few years ago laminectomy.    Limitations Walking;Lifting;Standing   How long can you sit comfortably? as long as I want   How long can you stand comfortably? long enough to shave   How long can you walk comfortably? pain immediately upon standing,    Diagnostic tests none recently   Patient Stated Goals walk;     Currently in Pain? Yes   Pain Score 7   right hip 5/10   Pain Location Back  hip right   Pain Orientation Right;Mid   Pain Descriptors / Indicators Sharp   Pain Type Chronic pain   Pain  Onset More than a month ago   Pain Frequency Intermittent   Aggravating Factors  standing   Pain Relieving Factors supine and sitting   Multiple Pain Sites No            [facility] PT Assessment - 06/24/15 0001    Flexibility   Soft Tissue Assessment /Muscle Length yes   Hamstrings 60 bil                     [facility] Adult PT Treatment/Exercise - 06/24/15 0001    Lumbar Exercises: Stretches   Active Hamstring Stretch 3 reps;20 seconds   Passive Hamstring Stretch 1 rep;30 seconds  bil.    Passive Hamstring Stretch Limitations stretch bil. hip adductors   Single Knee to Chest Stretch 3 reps;20 seconds   Double Knee to Chest Stretch 3 reps;20 seconds   Lower Trunk Rotation 3 reps;20 seconds  used pillow for ease, but no discomfort   Piriformis Stretch 30 seconds;1 rep  bil   Lumbar Exercises: Seated   Other Seated Lumbar Exercises abdominal strength with yellow ball Lt/Rt x 10 each side; bil. shoulder flexion holding yellow ball 2x10 with verbal cues on breathing and posture.  standing x 10, leaning against wall, unable to perform more   Lumbar Exercises: Sidelying   Clam 20 reps  with yellow t-band, each side   Modalities   Modalities Moist Heat   Moist Heat Therapy   Number Minutes Moist Heat 20 Minutes   Moist Heat Location Lumbar Spine  on HMP while performin stretching and S/L exercises                PT Education - 06/24/15 1142    Education provided No          PT Short Term Goals - 06/24/15 1139    PT SHORT TERM GOAL #1   Title The patient will have a knowledge of basic self care including use of RW for pain control and more upright walking to the mailbox and in the community, regular use of heat, home TENs unit for pain control  06/22/15   Period Weeks   Status Achieved   PT SHORT TERM GOAL #2   Title The patient will improved HS length to 60 degrees and hip extension to 5 degrees needed for more upright posture and increased stride length  with ambulation   Time 4   Period Weeks   PT SHORT TERM GOAL #3   Title The patient will report a 25% improvement in pain and function with errands,  assisting wife   Time 4   Period Weeks   Status On-going           PT Long Term Goals - 05/25/15 2037    PT LONG TERM GOAL #1   Title The patient will be independent in safe self progression of HEP for further improvements in ROM, strength, pain and function 07/20/15   Time 8   Period Weeks   Status New   PT LONG TERM GOAL #2   Title The patient will have improved trunk/core strength to 3+/5 needed for standing/walking longer periods of time at home and in the community with decreased forward trunk lean   Time 8   Period Weeks   Status New   PT LONG TERM GOAL #3   Title Bilateral hip strength improved to 4/5 needed for standing/walking home and community distances   Time 8   Period Weeks   Status New   PT LONG TERM GOAL #4   Title improve lumbar extension to neutral  for improved posture in standing    Time 8   Period Weeks   Status New               Plan - 06/24/15 1142    Clinical Impression Statement Patient has met his STG's.  Hamstring length bil. 60 degrees. Patient had increased back and hip pain today.  Patient has tight hip adductors.  Patinet has difficulty with bed mobility. Patient will benefit fromphysical therapy to improve strength and flexibility.    Pt will benefit from skilled therapeutic intervention in order to improve on the following deficits Decreased activity tolerance;Pain;Increased muscle spasms;Decreased strength;Decreased endurance;Decreased range of motion;Hypomobility;Difficulty walking;Impaired flexibility;Postural dysfunction   Clinical Impairments Affecting Rehab Potential right THR, advanced age   PT Frequency 2x / week   PT Duration 8 weeks   PT Treatment/Interventions ADLs/Self Care Home Management;Cryotherapy;Electrical Stimulation;Ultrasound;Moist Heat;Therapeutic  exercise;Patient/family education;Manual techniques;Dry needling;Taping   PT Next Visit Plan stretch hip adductors; core strength   PT Home Exercise Plan progress as needed   Recommended Other Services recommend RW for walking to the mailbox and in the community; use home  TENS unit for pain control   Consulted and Agree with Plan of Care Patient        Problem List Patient Active Problem List   Diagnosis Date Noted  . Depression 03/29/2015  . Postlaminectomy syndrome, lumbar region 01/09/2014  . Other secondary scoliosis, thoracolumbar region 01/09/2014  . Viral URI with cough 12/16/2013  . Rapid heart rate 08/05/2013  . Chronic radicular low back pain 06/03/2013  . Unspecified hypothyroidism 06/03/2013  . Accelerated hypertension 06/03/2013  . Insomnia 06/03/2013  . BPH without urinary obstruction 06/03/2013    Earlie Counts, PT 06/24/2015 11:45 AM    [facility] [address] [place], [place], [address] Phone: [phone]   Fax:  [phone]  Name: Delman Goshorn MRN: [id number] Date of Birth: 22-Jan-1921

## 2015-06-28 ENCOUNTER — Encounter: Payer: Self-pay | Admitting: Physical Therapy

## 2015-06-28 ENCOUNTER — Ambulatory Visit: Payer: Medicare Other | Admitting: Physical Therapy

## 2015-06-28 DIAGNOSIS — M545 Low back pain, unspecified: Secondary | ICD-10-CM

## 2015-06-28 DIAGNOSIS — R269 Unspecified abnormalities of gait and mobility: Secondary | ICD-10-CM | POA: Diagnosis not present

## 2015-06-28 DIAGNOSIS — M25651 Stiffness of right hip, not elsewhere classified: Secondary | ICD-10-CM

## 2015-06-28 DIAGNOSIS — R29898 Other symptoms and signs involving the musculoskeletal system: Secondary | ICD-10-CM

## 2015-06-28 NOTE — Patient Instructions (Signed)
Bridge    Lift hips, keeping pelvis level. Do _10__ times, _1__ times per day.  http://ss.exer.us/364  Bridging    Slowly raise buttocks from floor, keeping stomach tight. Hold 5 sec.  Repeat 10____ times per set. Do __1__ sets per session. Do _1___ sessions per day.  http://orth.exer.us/1096   Copyright  VHI. All rights reserved.  Straight Leg Raise    Tighten stomach and slowly raise locked right leg __6__ inches from floor. Repeat _10___ times per set. Do __1__ sets per session. Then do the other leg. Do __1__ sessions per day.  http://orth.exer.us/1102   Copyright  VHI. All rights reserved.  Abduction    Slide one leg out to side. Keep kneecap pointing up. Gently bring leg back . Repeat with other leg. Repeat _10___ times. Do _1___ sessions per day. Then do the other leg  http://[email or web address].exer.us/373   Copyright  VHI. All rights reserved.   Bracing With Arms / Legs (Hook-Lying)    With neutral spine, tighten pelvic floor and abdominals and hold. Raise arm and opposite leg, then return. Repeat wtih other limbs. Repeat _10__ times. Do 1___ times a day.   Copyright  VHI. All rights reserved.  [facility] [address] [place], [place] [address] Phone # [phone] Fax [phone]

## 2015-06-28 NOTE — Therapy (Signed)
[facility] [address], [place], [place], [address] Phone: [phone]   Fax:  [phone]  Physical Therapy Treatment  Patient Details  Name: Ronald Strong MRN: [id number] Date of Birth: 12-31-1920 No Data Recorded  Encounter Date: 06/28/2015      PT End of Session - 06/28/15 1240    Visit Number 6   Number of Visits 10  medicare   Date for PT Re-Evaluation 07/20/15   Authorization Type Medicare G codes; Kx at 15   PT Start Time 1237   PT Stop Time 1315   PT Time Calculation (min) 38 min   Activity Tolerance Patient tolerated treatment well   Behavior During Therapy [facility] for tasks assessed/performed      Past Medical History  Diagnosis Date  . Arthritis   . Depression   . Chicken pox   . Diverticulitis   . Allergy   . Arrhythmia   . Hypertension   . Colon polyps   . Thyroid disease     Past Surgical History  Procedure Laterality Date  . Total hip arthroplasty  09/21/06    right  . Other surgical history  09/01/10    spine decompression  . Spine surgery  09/11/10  . Prostate surgery  01/28/13    laser    There were no vitals filed for this visit.  Visit Diagnosis:  Abnormality of gait  Bilateral low back pain without sciatica  Weakness of both lower extremities  Hip stiffness, right      Subjective Assessment - 06/28/15 1239    Subjective My back is hurting today. Only time I am really comfortable is when I am laying in bed. I go to accupuncture to the back and helps a little.    Pertinent History Few years ago laminectomy.    Limitations Walking;Lifting;Standing   How long can you sit comfortably? as long as I want   How long can you stand comfortably? long enough to shave   How long can you walk comfortably? pain immediately upon standing,    Diagnostic tests none recently   Patient Stated Goals walk;     Currently in Pain? Yes   Pain Score 8    Pain Location Back   Pain Orientation  Right;Mid   Pain Type Chronic pain   Pain Onset More than a month ago   Pain Frequency Intermittent   Aggravating Factors  standing   Pain Relieving Factors lay in bed   Multiple Pain Sites No                         [facility] Adult PT Treatment/Exercise - 06/28/15 0001    Lumbar Exercises: Stretches   Active Hamstring Stretch 3 reps;20 seconds   Lumbar Exercises: Supine   Bridge 10 reps;1 second  feet on red physioball   Other Supine Lumbar Exercises supine feet on red physicoball and alternate hip flexion 20x; bring one leg off the ball to the side and alternate 15x; bring red ball overhead and back with breathing 20x; hold ball at chest and go side to side without moving trunk 15x; diagonals with red ball 20x each way   Knee/Hip Exercises: Aerobic   Elliptical St#8, arms #10 7 min level 1   Modalities   Modalities Moist Heat   Moist Heat Therapy   Number Minutes Moist Heat 40 Minutes   Moist Heat Location Lumbar Spine  on HMP while performin stretching and  S/L exercises                PT Education - 06/28/15 1311    Education provided Yes   Education Details back strengthening   Person(s) Educated Patient   Methods Explanation;Demonstration;Verbal cues;Handout   Comprehension Returned demonstration;Verbalized understanding          PT Short Term Goals - 06/28/15 1241    PT SHORT TERM GOAL #1   Title The patient will have a knowledge of basic self care including use of RW for pain control and more upright walking to the mailbox and in the community, regular use of heat, home TENs unit for pain control  06/22/15   Time 4   Period Weeks   Status Achieved   PT SHORT TERM GOAL #2   Title The patient will improved HS length to 60 degrees and hip extension to 5 degrees needed for more upright posture and increased stride length with ambulation   Time 4   Period Weeks   Status Achieved   PT SHORT TERM GOAL #3   Title The patient will report a 25%  improvement in pain and function with errands,  assisting wife   Time 4   Period Weeks   Status Achieved           PT Long Term Goals - 05/25/15 2037    PT LONG TERM GOAL #1   Title The patient will be independent in safe self progression of HEP for further improvements in ROM, strength, pain and function 07/20/15   Time 8   Period Weeks   Status New   PT LONG TERM GOAL #2   Title The patient will have improved trunk/core strength to 3+/5 needed for standing/walking longer periods of time at home and in the community with decreased forward trunk lean   Time 8   Period Weeks   Status New   PT LONG TERM GOAL #3   Title Bilateral hip strength improved to 4/5 needed for standing/walking home and community distances   Time 8   Period Weeks   Status New   PT LONG TERM GOAL #4   Title improve lumbar extension to neutral  for improved posture in standing    Time 8   Period Weeks   Status New               Plan - 06/28/15 1312    Clinical Impression Statement Patient hs met all of his STG's.  Patient has started more advanced level back strengthening while on hot pack.  Patient has tight hip adductors.  Patients home exercise program has been advanced. Patient will benefit from physical therapy to improve strength and mobility.    Pt will benefit from skilled therapeutic intervention in order to improve on the following deficits Decreased activity tolerance;Pain;Increased muscle spasms;Decreased strength;Decreased endurance;Decreased range of motion;Hypomobility;Difficulty walking;Impaired flexibility;Postural dysfunction   Rehab Potential Good   Clinical Impairments Affecting Rehab Potential right THR, advanced age   PT Frequency 2x / week   PT Duration 8 weeks   PT Treatment/Interventions ADLs/Self Care Home Management;Cryotherapy;Electrical Stimulation;Ultrasound;Moist Heat;Therapeutic exercise;Patient/family education;Manual techniques;Dry needling;Taping   PT Next Visit  Plan core strength; stretch hamstring, quads, and hip adductors   PT Home Exercise Plan progress as needed   Consulted and Agree with Plan of Care Patient        Problem List Patient Active Problem List   Diagnosis Date Noted  . Depression 03/29/2015  . Postlaminectomy syndrome, lumbar region  01/09/2014  . Other secondary scoliosis, thoracolumbar region 01/09/2014  . Viral URI with cough 12/16/2013  . Rapid heart rate 08/05/2013  . Chronic radicular low back pain 06/03/2013  . Unspecified hypothyroidism 06/03/2013  . Accelerated hypertension 06/03/2013  . Insomnia 06/03/2013  . BPH without urinary obstruction 06/03/2013    Earlie Counts, PT 06/28/2015 1:16 PM    [facility] [address] [place], [place], [address] Phone: [phone]   Fax:  [phone]  Name: Ronald Strong MRN: [id number] Date of Birth: 1920-06-09

## 2015-06-30 ENCOUNTER — Ambulatory Visit: Payer: Medicare Other | Attending: Family Medicine | Admitting: Physical Therapy

## 2015-06-30 ENCOUNTER — Encounter: Payer: Self-pay | Admitting: Physical Therapy

## 2015-06-30 DIAGNOSIS — R269 Unspecified abnormalities of gait and mobility: Secondary | ICD-10-CM

## 2015-06-30 DIAGNOSIS — M545 Low back pain, unspecified: Secondary | ICD-10-CM

## 2015-06-30 DIAGNOSIS — R29898 Other symptoms and signs involving the musculoskeletal system: Secondary | ICD-10-CM | POA: Insufficient documentation

## 2015-06-30 DIAGNOSIS — M25651 Stiffness of right hip, not elsewhere classified: Secondary | ICD-10-CM | POA: Diagnosis not present

## 2015-06-30 NOTE — Therapy (Signed)
[facility] [address] [place], [place], [address] Phone: [phone]   Fax:  [phone]  Physical Therapy Treatment  Patient Details  Name: Ronald Strong MRN: [id number] Date of Birth: 16-Dec-1920 No Data Recorded  Encounter Date: 06/30/2015      PT End of Session - 06/30/15 1309    Visit Number 7   Number of Visits 10  Medicare   Date for PT Re-Evaluation 07/20/15   Authorization Type Medicare G codes; Kx at 15   PT Start Time [id number]   PT Stop Time 1313   PT Time Calculation (min) 38 min   Activity Tolerance Patient tolerated treatment well   Behavior During Therapy [facility] for tasks assessed/performed      Past Medical History  Diagnosis Date  . Arthritis   . Depression   . Chicken pox   . Diverticulitis   . Allergy   . Arrhythmia   . Hypertension   . Colon polyps   . Thyroid disease     Past Surgical History  Procedure Laterality Date  . Total hip arthroplasty  09/21/06    right  . Other surgical history  09/01/10    spine decompression  . Spine surgery  09/11/10  . Prostate surgery  01/28/13    laser    There were no vitals filed for this visit.  Visit Diagnosis:  Abnormality of gait  Bilateral low back pain without sciatica  Weakness of both lower extremities  Hip stiffness, right      Subjective Assessment - 06/30/15 1240    Subjective My back is hurting.  When I leave from therapy I feel better.    Pertinent History Few years ago laminectomy.    Limitations Walking;Lifting;Standing   How long can you sit comfortably? as long as I want   How long can you stand comfortably? long enough to shave   How long can you walk comfortably? pain immediately upon standing,    Diagnostic tests none recently   Patient Stated Goals walk;     Currently in Pain? Yes   Pain Score 8    Pain Location Back   Pain Orientation Right;Mid   Pain Descriptors / Indicators Sharp   Pain Type Chronic pain   Pain Onset More than a month ago   Pain Frequency Intermittent   Aggravating Factors  standing, walking   Pain Relieving Factors sitting, laying down                         [facility] Adult PT Treatment/Exercise - 06/30/15 0001    Lumbar Exercises: Supine   Bridge 10 reps;1 second  feet on red physioball   Other Supine Lumbar Exercises supine feet on red physioball and alternate hip flexion 20x;  bring red ball overhead and back with breathing 20x; hold ball at chest and go side to side without moving trunk 15x; diagonals with red ball 20x each way   Other Supine Lumbar Exercises body press hold 5 sec 10x   Knee/Hip Exercises: Aerobic   Nustep St. #8 level 1 arms #10 6 min   Modalities   Modalities Moist Heat   Moist Heat Therapy   Number Minutes Moist Heat 30 Minutes   Moist Heat Location Lumbar Spine  on HMP while performin stretching and S/L exercises                PT Education - 06/30/15 1304    Education  provided No          PT Short Term Goals - 06/28/15 1241    PT SHORT TERM GOAL #1   Title The patient will have a knowledge of basic self care including use of RW for pain control and more upright walking to the mailbox and in the community, regular use of heat, home TENs unit for pain control  06/22/15   Time 4   Period Weeks   Status Achieved   PT SHORT TERM GOAL #2   Title The patient will improved HS length to 60 degrees and hip extension to 5 degrees needed for more upright posture and increased stride length with ambulation   Time 4   Period Weeks   Status Achieved   PT SHORT TERM GOAL #3   Title The patient will report a 25% improvement in pain and function with errands,  assisting wife   Time 4   Period Weeks   Status Achieved           PT Long Term Goals - 05/25/15 2037    PT LONG TERM GOAL #1   Title The patient will be independent in safe self progression of HEP for further improvements in ROM, strength, pain and function  07/20/15   Time 8   Period Weeks   Status New   PT LONG TERM GOAL #2   Title The patient will have improved trunk/core strength to 3+/5 needed for standing/walking longer periods of time at home and in the community with decreased forward trunk lean   Time 8   Period Weeks   Status New   PT LONG TERM GOAL #3   Title Bilateral hip strength improved to 4/5 needed for standing/walking home and community distances   Time 8   Period Weeks   Status New   PT LONG TERM GOAL #4   Title improve lumbar extension to neutral  for improved posture in standing    Time 8   Period Weeks   Status New               Plan - 06/30/15 1312    Clinical Impression Statement Patient is doing more core work in supine due to standing increases pain.  Patient may benefit from exercise in sitting due to not increasing his pain. Patient has tigt hamstring and hip adductors.  Patient would benefit form physical therapy to improve strength.    Pt will benefit from skilled therapeutic intervention in order to improve on the following deficits Decreased activity tolerance;Pain;Increased muscle spasms;Decreased strength;Decreased endurance;Decreased range of motion;Hypomobility;Difficulty walking;Impaired flexibility;Postural dysfunction   Rehab Potential Good   Clinical Impairments Affecting Rehab Potential right THR, advanced age   PT Frequency 2x / week   PT Duration 8 weeks   PT Treatment/Interventions ADLs/Self Care Home Management;Cryotherapy;Electrical Stimulation;Ultrasound;Moist Heat;Therapeutic exercise;Patient/family education;Manual techniques;Dry needling;Taping   PT Next Visit Plan core strength; stretch hamstring, quads, and hip adductors   PT Home Exercise Plan progress as needed   Consulted and Agree with Plan of Care Patient        Problem List Patient Active Problem List   Diagnosis Date Noted  . Depression 03/29/2015  . Postlaminectomy syndrome, lumbar region 01/09/2014  . Other  secondary scoliosis, thoracolumbar region 01/09/2014  . Viral URI with cough 12/16/2013  . Rapid heart rate 08/05/2013  . Chronic radicular low back pain 06/03/2013  . Unspecified hypothyroidism 06/03/2013  . Accelerated hypertension 06/03/2013  . Insomnia 06/03/2013  . BPH without urinary  obstruction 06/03/2013    Earlie Counts, PT 06/30/2015 1:16 PM    [facility] [address] [place], [place], [address] Phone: [phone]   Fax:  [phone]  Name: Ronald Strong MRN: [id number] Date of Birth: [birth year]

## 2015-07-05 ENCOUNTER — Ambulatory Visit: Payer: Medicare Other | Admitting: Physical Therapy

## 2015-07-07 ENCOUNTER — Encounter: Payer: Self-pay | Admitting: Physical Therapy

## 2015-07-12 ENCOUNTER — Ambulatory Visit: Payer: Medicare Other

## 2015-07-15 ENCOUNTER — Ambulatory Visit: Payer: Medicare Other

## 2015-07-15 DIAGNOSIS — R29898 Other symptoms and signs involving the musculoskeletal system: Secondary | ICD-10-CM | POA: Diagnosis not present

## 2015-07-15 DIAGNOSIS — R269 Unspecified abnormalities of gait and mobility: Secondary | ICD-10-CM | POA: Diagnosis not present

## 2015-07-15 DIAGNOSIS — M545 Low back pain, unspecified: Secondary | ICD-10-CM

## 2015-07-15 DIAGNOSIS — M25651 Stiffness of right hip, not elsewhere classified: Secondary | ICD-10-CM

## 2015-07-15 NOTE — Therapy (Signed)
[facility] [address] [place], [place], [address] Phone: [phone]   Fax:  [phone]  Physical Therapy Treatment  Patient Details  Name: Ronald Strong MRN: [id number] Date of Birth: [birth year] Referring Provider: Macon Large, MD  Encounter Date: 07/15/2015      PT End of Session - 07/15/15 1518    Visit Number 8   PT Start Time 7035   PT Stop Time 1513   PT Time Calculation (min) 34 min   Activity Tolerance Patient tolerated treatment well   Behavior During Therapy [facility] for tasks assessed/performed      Past Medical History  Diagnosis Date  . Arthritis   . Depression   . Chicken pox   . Diverticulitis   . Allergy   . Arrhythmia   . Hypertension   . Colon polyps   . Thyroid disease     Past Surgical History  Procedure Laterality Date  . Total hip arthroplasty  09/21/06    right  . Other surgical history  09/01/10    spine decompression  . Spine surgery  09/11/10  . Prostate surgery  01/28/13    laser    There were no vitals filed for this visit.  Visit Diagnosis:  Bilateral low back pain without sciatica  Weakness of both lower extremities  Hip stiffness, right      Subjective Assessment - 07/15/15 1443    Subjective Ready for D/C.  Pt reports 80% overall improvement since the start of care.     Currently in Pain? Yes   Pain Score 8   with standing   Pain Orientation Right;Mid   Pain Descriptors / Indicators Sharp   Pain Type Chronic pain   Pain Onset More than a month ago   Pain Frequency Intermittent   Aggravating Factors  standing, walking   Pain Relieving Factors sitting, laying down            [facility] PT Assessment - 07/15/15 0001    Assessment   Medical Diagnosis scoliosis, thoracolumbar region, gait difficulty   Referring Provider Macon Large, MD   ROM / Strength   AROM / PROM / Strength Strength   AROM   Overall AROM  Deficits   AROM Assessment Site Knee;Hip    Strength   Overall Strength Deficits   Overall Strength Comments Bil. knee strength 4+/5   Right/Left Hip Right;Left   Right Hip Flexion 4/5   Right Hip ABduction 4-/5   Left Hip Flexion 4+/5   Left Hip ABduction 4/5                     [facility] Adult PT Treatment/Exercise - 07/15/15 0001    Lumbar Exercises: Stretches   Single Knee to Chest Stretch 3 reps;20 seconds   Double Knee to Chest Stretch 3 reps;20 seconds   Lower Trunk Rotation 3 reps;20 seconds  used pillow for ease, but no discomfort   Lumbar Exercises: Supine   Bridge 10 reps;1 second  feet on red physioball   Other Supine Lumbar Exercises supine feet on red physioball and alternate hip flexion 20x;  bring red ball overhead and back with breathing 20x; hold ball at chest and go side to side without moving trunk 15x; diagonals with red ball 20x each way   Knee/Hip Exercises: Aerobic   Nustep St. #8 level 1 arms #10 6 min   Knee/Hip Exercises: Supine   Straight Leg Raises 2 sets;10 reps;Both;Strengthening   Modalities  Modalities Moist Heat   Moist Heat Therapy   Number Minutes Moist Heat 25 Minutes   Moist Heat Location Lumbar Spine  during exercise                  PT Short Term Goals - 06/28/15 1241    PT SHORT TERM GOAL #1   Title The patient will have a knowledge of basic self care including use of RW for pain control and more upright walking to the mailbox and in the community, regular use of heat, home TENs unit for pain control  06/22/15   Time 4   Period Weeks   Status Achieved   PT SHORT TERM GOAL #2   Title The patient will improved HS length to 60 degrees and hip extension to 5 degrees needed for more upright posture and increased stride length with ambulation   Time 4   Period Weeks   Status Achieved   PT SHORT TERM GOAL #3   Title The patient will report a 25% improvement in pain and function with errands,  assisting wife   Time 4   Period Weeks   Status Achieved            PT Long Term Goals - 2015/08/02 1447    PT LONG TERM GOAL #1   Title The patient will be independent in safe self progression of HEP for further improvements in ROM, strength, pain and function 07/20/15   Status Achieved   PT LONG TERM GOAL #2   Title The patient will have improved trunk/core strength to 3+/5 needed for standing/walking longer periods of time at home and in the community with decreased forward trunk lean   Status Not Met   PT LONG TERM GOAL #4   Title improve lumbar extension to neutral  for improved posture in standing    Status Partially Met               Plan - 08-02-15 1448    Clinical Impression Statement Pt is ready for D/C to HEP.  PT is limited due to LBP with standing activity.  Pt demonstrates flexed posture and is making postural modifications as able.  Walking is limited to 5 minutes due to pain.  Pt has HEP in place for hip and core strength to improve mobility and endurance.   Clinical Impairments Affecting Rehab Potential right THR, advanced age   PT Next Visit Plan D/C PT to HEP   Consulted and Agree with Plan of Care Patient          G-Codes - Aug 02, 2015 1446    Functional Assessment Tool Used clinical judgement   Functional Limitation Mobility: Walking and moving around   Mobility: Walking and Moving Around Goal Status [phone]) At least 40 percent but less than 60 percent impaired, limited or restricted   Mobility: Walking and Moving Around Discharge Status [phone]) At least 40 percent but less than 60 percent impaired, limited or restricted      Problem List Patient Active Problem List   Diagnosis Date Noted  . Depression 03/29/2015  . Postlaminectomy syndrome, lumbar region 01/09/2014  . Other secondary scoliosis, thoracolumbar region 01/09/2014  . Viral URI with cough 12/16/2013  . Rapid heart rate 08/05/2013  . Chronic radicular low back pain 06/03/2013  . Unspecified hypothyroidism 06/03/2013  . Accelerated hypertension 06/03/2013   . Insomnia 06/03/2013  . BPH without urinary obstruction 06/03/2013   PHYSICAL THERAPY DISCHARGE SUMMARY  Visits from Start of Care: 8  Current functional level related to goals / functional outcomes: See above for current status.   Remaining deficits: Poor posture, LE weakness, endurance deficits, pain of a chronic nature.  Pt has HEP in place for continued gains.     Education / Equipment: HEP Plan: Patient agrees to discharge.  Patient goals were partially met. Patient is being discharged due to being pleased with the current functional level.  ?????   Sigurd Sos, PT 07/15/2015 3:21 PM   [facility] [address], [place], [place], [address] Phone: [phone]   Fax:  [phone]  Name: Chanze Teagle MRN: [id number] Date of Birth: 1921-02-28

## 2015-07-30 ENCOUNTER — Other Ambulatory Visit: Payer: Self-pay | Admitting: Physical Medicine & Rehabilitation

## 2015-08-17 ENCOUNTER — Other Ambulatory Visit: Payer: Self-pay | Admitting: *Deleted

## 2015-08-17 MED ORDER — ZOLPIDEM TARTRATE 5 MG PO TABS: ORAL_TABLET | ORAL | Status: DC

## 2015-09-03 ENCOUNTER — Ambulatory Visit (INDEPENDENT_AMBULATORY_CARE_PROVIDER_SITE_OTHER): Payer: Medicare Other | Admitting: Family Medicine

## 2015-09-03 ENCOUNTER — Encounter: Payer: Self-pay | Admitting: Family Medicine

## 2015-09-03 VITALS — BP 110/60 | HR 92 | Temp 98.6°F | Ht 65.0 in | Wt 160.0 lb

## 2015-09-03 DIAGNOSIS — R062 Wheezing: Secondary | ICD-10-CM

## 2015-09-03 DIAGNOSIS — R05 Cough: Secondary | ICD-10-CM

## 2015-09-03 DIAGNOSIS — R059 Cough, unspecified: Secondary | ICD-10-CM

## 2015-09-03 MED ORDER — METHYLPREDNISOLONE ACETATE 80 MG/ML IJ SUSP
80.0000 mg | Freq: Once | INTRAMUSCULAR | Status: AC
  Administered 2015-09-03: 80 mg via INTRAMUSCULAR

## 2015-09-03 MED ORDER — CEFUROXIME AXETIL 250 MG PO TABS: 250.0000 mg | ORAL_TABLET | Freq: Two times a day (BID) | ORAL | Status: DC

## 2015-09-03 NOTE — Progress Notes (Signed)
Pre visit review using our clinic review tool, if applicable. No additional management support is needed unless otherwise documented below in the visit note. 

## 2015-09-03 NOTE — Progress Notes (Signed)
   Subjective:    Patient ID: Ronald Strong, male    DOB: Nov 18, 1920, 82 y.o.   MRN: [id number]  HPI Acute visit Patient seen with 3 day history of cough. Productive of thick yellow to green sputum. Questionable subjective low-grade fever this morning. No dyspnea. No nausea or vomiting. No confusion. Ex-smoker. No history of reported COPD.  Past Medical History  Diagnosis Date  . Arthritis   . Depression   . Chicken pox   . Diverticulitis   . Allergy   . Arrhythmia   . Hypertension   . Colon polyps   . Thyroid disease    Past Surgical History  Procedure Laterality Date  . Total hip arthroplasty  09/21/06    right  . Other surgical history  09/01/10    spine decompression  . Spine surgery  09/11/10  . Prostate surgery  01/28/13    laser    reports that he quit smoking about 32 years ago. He does not have any smokeless tobacco history on file. He reports that he drinks alcohol. He reports that he does not use illicit drugs. family history includes Arthritis in his other; Heart disease in his other; Hypertension in his other. Allergies  Allergen Reactions  . Bee Venom Anaphylaxis  . Epinephrine Anaphylaxis    severe anaphylaxis      Review of Systems  Constitutional: Positive for fatigue. Negative for chills.  HENT: Positive for congestion. Negative for sore throat.   Respiratory: Positive for cough.   Gastrointestinal: Negative for nausea and vomiting.       Objective:   Physical Exam  Constitutional: He appears well-developed and well-nourished.  HENT:  Mouth/Throat: Oropharynx is clear and moist.  Neck: Neck supple.  Cardiovascular: Normal rate and regular rhythm.   Pulmonary/Chest: Effort normal. He has wheezes. He has no rales.  Normal respiratory rate.  No retractions.  Lymphadenopathy:    He has no cervical adenopathy.          Assessment & Plan:  Cough with mild reactive very component. No respiratory distress. Pulse oximetry 97%. May all be viral  but with his age and reactive airways will cover with Ceftin 250 mg twice a day for 7 days. Depo-Medrol 80 mg IM given. Follow-up immediately for any fever or increased shortness of breath. Suggest over-the-counter plain Mucinex twice daily  Eulas Post MD [facility]

## 2015-09-03 NOTE — Patient Instructions (Signed)
Stay well hydrated Consider OTC plain Mucinex Follow up for any fever or increased shortness of breath.

## 2015-09-13 ENCOUNTER — Other Ambulatory Visit: Payer: Self-pay | Admitting: Family Medicine

## 2015-10-05 ENCOUNTER — Encounter: Payer: Self-pay | Admitting: Physical Medicine & Rehabilitation

## 2015-10-05 ENCOUNTER — Ambulatory Visit (HOSPITAL_BASED_OUTPATIENT_CLINIC_OR_DEPARTMENT_OTHER): Payer: Medicare Other | Admitting: Physical Medicine & Rehabilitation

## 2015-10-05 ENCOUNTER — Encounter: Payer: Medicare Other | Attending: Physical Medicine & Rehabilitation

## 2015-10-05 VITALS — BP 124/68 | HR 81 | Resp 14

## 2015-10-05 DIAGNOSIS — E079 Disorder of thyroid, unspecified: Secondary | ICD-10-CM | POA: Diagnosis not present

## 2015-10-05 DIAGNOSIS — Z86010 Personal history of colonic polyps: Secondary | ICD-10-CM | POA: Insufficient documentation

## 2015-10-05 DIAGNOSIS — I499 Cardiac arrhythmia, unspecified: Secondary | ICD-10-CM | POA: Insufficient documentation

## 2015-10-05 DIAGNOSIS — M1990 Unspecified osteoarthritis, unspecified site: Secondary | ICD-10-CM | POA: Insufficient documentation

## 2015-10-05 DIAGNOSIS — M21372 Foot drop, left foot: Secondary | ICD-10-CM

## 2015-10-05 DIAGNOSIS — M961 Postlaminectomy syndrome, not elsewhere classified: Secondary | ICD-10-CM

## 2015-10-05 DIAGNOSIS — H8110 Benign paroxysmal vertigo, unspecified ear: Secondary | ICD-10-CM | POA: Diagnosis not present

## 2015-10-05 DIAGNOSIS — I10 Essential (primary) hypertension: Secondary | ICD-10-CM | POA: Diagnosis not present

## 2015-10-05 DIAGNOSIS — G8929 Other chronic pain: Secondary | ICD-10-CM

## 2015-10-05 DIAGNOSIS — Z87891 Personal history of nicotine dependence: Secondary | ICD-10-CM | POA: Diagnosis not present

## 2015-10-05 DIAGNOSIS — F329 Major depressive disorder, single episode, unspecified: Secondary | ICD-10-CM | POA: Insufficient documentation

## 2015-10-05 DIAGNOSIS — M5410 Radiculopathy, site unspecified: Secondary | ICD-10-CM

## 2015-10-05 DIAGNOSIS — M545 Low back pain: Secondary | ICD-10-CM | POA: Diagnosis not present

## 2015-10-05 DIAGNOSIS — M21379 Foot drop, unspecified foot: Secondary | ICD-10-CM | POA: Insufficient documentation

## 2015-10-05 DIAGNOSIS — M5416 Radiculopathy, lumbar region: Principal | ICD-10-CM

## 2015-10-05 MED ORDER — TRAMADOL HCL 50 MG PO TABS: 50.0000 mg | ORAL_TABLET | Freq: Three times a day (TID) | ORAL | Status: DC | PRN

## 2015-10-05 NOTE — Progress Notes (Signed)
Subjective:    Patient ID: Ronald Strong, male    DOB: Jun 05, 1920, 82 y.o.   MRN: [id number]  HPI Progressive right-sided low back pain. Patient has tried radiofrequency ablation in the past as well as spinal injections as well as physical therapy. He gets some partial relief with acupuncture as well as TENS unit. We discussed that TENS units are available over-the-counter now. We also discussed PENS, [employer]  Pt is currently taking tramadol 50 mg twice a day however he feels like he may need another dose of this.  Pain Inventory Average Pain 9 Pain Right Now 9 My pain is constant and sharp  In the last 24 hours, has pain interfered with the following? General activity 9 Relation with others 10 Enjoyment of life 8 What TIME of day is your pain at its worst? all Sleep (in general) Good  Pain is worse with: no selection Pain improves with: no selection Relief from Meds: 5  Mobility walk without assistance how many minutes can you walk? 5 ability to climb steps?  yes do you drive?  yes Do you have any goals in this area?  no  Function retired  Neuro/Psych trouble walking depression anxiety  Prior Studies Any changes since last visit?  no  Physicians involved in your care Any changes since last visit?  no   Family History  Problem Relation Age of Onset  . Arthritis Other   . Heart disease Other   . Hypertension Other    Social History   Social History  . Marital Status: Married    Spouse Name: N/A  . Number of Children: N/A  . Years of Education: N/A   Social History Main Topics  . Smoking status: Former Smoker    Quit date: 06/04/1983  . Smokeless tobacco: None  . Alcohol Use: Yes  . Drug Use: No  . Sexual Activity: Not Asked   Other Topics Concern  . None   Social History Narrative   Past Surgical History  Procedure Laterality Date  . Total hip arthroplasty  09/21/06    right  . Other surgical history  09/01/10    spine decompression  .  Spine surgery  09/11/10  . Prostate surgery  01/28/13    laser   Past Medical History  Diagnosis Date  . Arthritis   . Depression   . Chicken pox   . Diverticulitis   . Allergy   . Arrhythmia   . Hypertension   . Colon polyps   . Thyroid disease    BP 124/68 mmHg  Pulse 81  Resp 14  SpO2 93%  Opioid Risk Score:   Fall Risk Score:  `1  Depression screen PHQ 2/9  Depression screen [facility] 2/9 05/11/2015 06/03/2013  Decreased Interest 0 0  Down, Depressed, Hopeless 0 0  PHQ - 2 Score 0 0   '  Review of Systems  Genitourinary: Positive for difficulty urinating.  All other systems reviewed and are negative.      Objective:   Physical Exam  Constitutional: He is oriented to person, place, and time. He appears well-developed and well-nourished.  HENT:  Head: Normocephalic and atraumatic.  Eyes: Conjunctivae and EOM are normal. Pupils are equal, round, and reactive to light.  Musculoskeletal:       Right hip: He exhibits decreased range of motion.       Left hip: He exhibits decreased range of motion.  Bilateral hip flexion contractures unable to lay with Thighs flat.  Neurological:  He is alert and oriented to person, place, and time. He displays no atrophy. A sensory deficit is present. He exhibits normal muscle tone. Gait abnormal.  Reflex Scores:      Patellar reflexes are 1+ on the right side and 1+ on the left side.      Achilles reflexes are 1+ on the right side and 1+ on the left side. Sensation reduced to pp in Left dorsum of foot and great toe  Motor strength is 5/5 right hip flexion and extensor ankle dorsi flexion plan flexion 5 minus in the left hip flexors 5 minus knee extensors 3 minus left ankle dorsiflexors  Forward flexed posture  Psychiatric: He has a normal mood and affect.  Nursing note and vitals reviewed.   When arising from a supine position to sitting he immediately complained of vertigo. States that this has been happening more often. No  nystagmus was noted at the time Extraocular muscles are intact no evidence of nystagmus. No dizziness with extension of the cervical spine in a sitting position he has limited range of motion with cervical flexion extension lateral bending and rotation.      Assessment & Plan:  1. Lumbar spinal stenosis with lumbar postlaminectomy syndrome. He's had some increasing pain in the low back causing a greater forte flexed posture. He's developed bilateral hip contractures. In addition he is developed some left ankle dorsiflexor weakness. He states that he is not willing to consider surgery. He's tried epidural injections which have not been helpful for him. I do not think he needs an ankle-foot orthosis at this point. He will use an over-the-counter TENS unit. If he does not have adequate relief will consider PENS We discussed imaging studies however given the patient's request not to pursue any more aggressive treatment there is really no additional benefit at this point . Return to clinic In 3 months rather than in 6 months.  2. Probable benign positional vertigo will make referral to physical therapy.

## 2015-10-05 NOTE — Patient Instructions (Signed)
May get over-the-counter TENS unit at pharmacy.  We also discussed pens which is somewhat different because the needles actually penetrate the skin  Increase tramadol to 3 times per day  referral physical therapy for vestibular rehabilitation

## 2015-10-12 ENCOUNTER — Ambulatory Visit: Payer: Medicare Other | Attending: Family Medicine

## 2015-10-12 DIAGNOSIS — R2689 Other abnormalities of gait and mobility: Secondary | ICD-10-CM | POA: Insufficient documentation

## 2015-10-12 DIAGNOSIS — H8112 Benign paroxysmal vertigo, left ear: Secondary | ICD-10-CM | POA: Diagnosis not present

## 2015-10-12 NOTE — Therapy (Signed)
[facility] [address] [place], [place], [address] Phone: [phone]   Fax:  [phone]  Physical Therapy Evaluation  Patient Details  Name: Ronald Strong MRN: [id number] Date of Birth: 31-Jul-1920 Referring Provider: Dr. Letta Pate  Encounter Date: 10/12/2015      PT End of Session - 10/12/15 1614    Visit Number 1   Number of Visits 4   Date for PT Re-Evaluation 11/11/15   Authorization Type Medicare G codes; Kx at 82 (pt had PT earlier this year for 8 visits total).   PT Start Time 1534   PT Stop Time 1605   PT Time Calculation (min) 31 min   Activity Tolerance Patient tolerated treatment well   Behavior During Therapy WFL for tasks assessed/performed      Past Medical History  Diagnosis Date  . Arthritis   . Depression   . Chicken pox   . Diverticulitis   . Allergy   . Arrhythmia   . Hypertension   . Colon polyps   . Thyroid disease     Past Surgical History  Procedure Laterality Date  . Total hip arthroplasty  09/21/06    right  . Other surgical history  09/01/10    spine decompression  . Spine surgery  09/11/10  . Prostate surgery  01/28/13    laser    There were no vitals filed for this visit.       Subjective Assessment - 10/12/15 1539    Subjective Pt reports dizziness began suddenly about a month ago with no mechanism of injury. Pt reports it is worse when he txfs from lying down to sit and sit to stand. Pt describes dizziness as spinning sensation. Pt reported dizziness has now stopped. 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    Pertinent History Hx of laminectomy, HTN, depression, arthritis   Patient Stated Goals "I don't know why I am here."   Currently in Pain? No/denies            [facility] PT Assessment - 10/12/15 1545    Assessment   Medical Diagnosis BPPV   Referring Provider Dr. Letta Pate   Onset Date/Surgical Date 09/11/15   Precautions   Precautions Falls   Restrictions   Weight Bearing Restrictions No   Balance Screen   Has the patient fallen in the past 6 months Yes   How many times? 1  while carrying things and turning around   Has the patient had a decrease in activity level because of a fear of falling?  No   Is the patient reluctant to leave their home because of a fear of falling?  No   Home Social worker Private residence   Living Arrangements Spouse/significant other;Children   Available Help at Discharge Family   Type of [place] to enter   Entrance Stairs-Number of Steps 5   Entrance Stairs-Rails Right   Home Layout Two level   Alternate Level Stairs-Number of Steps [place] - 2 wheels;Kasandra Knudsen - single point   Prior Function   Level of Independence Independent   Vocation Retired   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            Vestibular Assessment - 10/12/15 0001    Symptom Behavior   Type of Dizziness Spinning   Frequency of Dizziness A few times a week but has been gone since before his MD appt per pt.   Duration of Dizziness A few seconds   Aggravating Factors Turning head quickly;Sit to stand  supine to sit   Relieving Factors Rest;Slow movements   Occulomotor Exam   Occulomotor Alignment Normal   Spontaneous Absent   Gaze-induced Absent   Smooth Pursuits Intact   Saccades Slow   Comment Pt  denied dizziness during occulomotor exam   Vestibulo-Occular Reflex   VOR 1 Head Only (x 1 viewing) WNL   Positional Testing   Dix-Hallpike Dix-Hallpike Right;Dix-Hallpike Left   Dix-Hallpike Right   Dix-Hallpike Right Duration none   Dix-Hallpike Right Symptoms No nystagmus   Dix-Hallpike Left   Dix-Hallpike Left Duration <5 sec. 5/10 dizziness   Dix-Hallpike Left Symptoms Upbeat, left rotatory nystagmus                Vestibular Treatment/Exercise - 10/12/15 1609    Vestibular Treatment/Exercise   Vestibular Treatment Provided Canalith Repositioning   Canalith Repositioning Epley Manuever Left    EPLEY MANUEVER LEFT   Number of Reps  2   Overall Response  Symptoms Resolved    RESPONSE DETAILS LEFT Pt reported 5/10 dizziness during first rep and 0/10 after second rep of treatment. Pt did report slight dizziness during third position of treatment during second rep.               PT Education - 10/12/15 1613    Education provided Yes   Education Details PT discussed frequency/duration and vestibular exam findings, which were consistent with L pBPPV. Pt agreeable to 1-2 visits of PT to clear BPPV but not balance or gait training BPPV, as he reports gait deviations are 2/2 back pain.   Person(s) Educated Patient   Methods Explanation   Comprehension Verbalized understanding          PT Short Term Goals - 10/12/15 1618    PT SHORT TERM GOAL #1   Title same as LTGs.           PT Long Term Goals - 10/12/15 1618    PT LONG TERM GOAL #1   Title Pt will report 0/10 dizziness  during position changes (supine<>sit and STS) to improve quality of life and safely perform ADLs. Target date: 11/09/15   Status New   PT LONG TERM GOAL #2   Title Positional testing will be negative for nystagmus or dizziness in order for pt to perform bed mobility without dizziness. Target date: 11/09/15   Status New               Plan - 10/12/15 1614    Clinical Impression  Statement Pt is a pleasant 82y/o male presenting to OPPT neuro with dizziness. Pt's exam revealed the following deficits: dizziness, gait deviations, postural impairments, and impaired balance. Pt's nystagmus and dizziness consistent with L pBPPV, which resolved with Epleys treatment. PT will monitor balance and gait deviations but pt reported he only wants treatment for dizziness, as he is the caregiver for  his wife.    Rehab Potential Good   PT Frequency 1x / week   PT Duration 4 weeks   PT Treatment/Interventions ADLs/Self Care Home Management;Therapeutic exercise;Patient/family education;Manual techniques;DME Instruction;Gait training;Stair training;Canalith Repostioning;Biofeedback;Vestibular;Neuromuscular re-education;Balance training   PT Next Visit Plan Re-assess for BPPV and d/c if appropriate.   Consulted and Agree with Plan of Care Patient      Patient will benefit from skilled therapeutic intervention in order to improve the following deficits and impairments:  Abnormal gait, Dizziness, Postural dysfunction, Decreased balance  Visit Diagnosis: BPPV (benign paroxysmal positional vertigo), left - Plan: PT plan of care cert/re-cert  Other abnormalities of gait and mobility - Plan: PT plan of care cert/re-cert      G-Codes - [id number]    Functional Assessment Tool Used 2.3ft/sec. and 5/10 dizziness during L Dix-Hallpike   Functional Limitation Changing and maintaining body position   Changing and Maintaining Body Position Current Status [id number]) At least 20 percent but less than 40 percent impaired, limited or restricted   Changing and Maintaining Body Position Goal Status [id number]) At least 1 percent but less than 20 percent impaired, limited or restricted       Problem List Patient Active Problem List   Diagnosis Date Noted  . Footdrop 10/05/2015  . Depression 03/29/2015  . Postlaminectomy syndrome, lumbar region 01/09/2014  . Other secondary scoliosis, thoracolumbar  region 01/09/2014  . Viral URI with cough 12/16/2013  . Rapid heart rate 08/05/2013  . Chronic radicular low back pain 06/03/2013  . Unspecified hypothyroidism 06/03/2013  . Accelerated hypertension 06/03/2013  . Insomnia 06/03/2013  . BPH without urinary obstruction 06/03/2013    Lyam Provencio L 10/12/2015, 4:23 PM  [facility] [address] [place], [place], [address] Phone: [phone]   Fax:  [phone]  Name: Milez Lagueux MRN: [id number] Date of Birth: January 19, 1921   Geoffry Paradise, PT,DPT 10/12/2015 4:23 PM Phone: [phone] Fax: [phone]

## 2015-10-22 ENCOUNTER — Ambulatory Visit: Payer: Medicare Other

## 2015-10-29 ENCOUNTER — Ambulatory Visit: Payer: Medicare Other | Admitting: Physical Therapy

## 2015-10-29 DIAGNOSIS — H8112 Benign paroxysmal vertigo, left ear: Secondary | ICD-10-CM

## 2015-10-29 DIAGNOSIS — R2689 Other abnormalities of gait and mobility: Secondary | ICD-10-CM | POA: Diagnosis not present

## 2015-10-29 NOTE — Patient Instructions (Signed)
Don, you can try this exercise if you feel like you have positional vertigo in the future. If you ever experience vertigo that is constant, occurs at rest, or does not change with head/body movement (getting in and out of bed), seek immediate medical attention.   Tip Card 1.The goal of habituation training is to assist in decreasing symptoms of vertigo, dizziness, or nausea provoked by specific head and body motions. 2.These exercises may initially increase symptoms; however, be persistent and work through symptoms. With repetition and time, the exercises will assist in reducing or eliminating symptoms. 3.Exercises should be stopped and discussed with the therapist if you experience any of the following: - Sudden change or fluctuation in hearing - New onset of ringing in the ears, or increase in current intensity - Any fluid discharge from the ear - Severe pain in neck or back - Extreme nausea   Copyright  VHI. All rights reserved.  Sit to Side-Lying   Sit on edge of bed. Lie down onto the right side and hold until dizziness stops, plus 20 seconds.  Return to sitting and wait until dizziness stops, plus 20 seconds.  Repeat to the left side. Repeat sequence 5 times per session. Do 2 sessions per day.

## 2015-10-29 NOTE — Therapy (Signed)
[facility] [address] [place], [place], [address] Phone: [phone]   Fax:  [phone]  Physical Therapy Treatment and Discharge Summary  Patient Details  Name: Ronald Strong MRN: [id number] Date of Birth: 1921-04-10 Referring Provider: Dr. Letta Pate  Encounter Date: 10/29/2015      PT End of Session - 10/29/15 1423    Visit Number 2   Number of Visits 4   Date for PT Re-Evaluation 11/11/15   Authorization Type Medicare G codes; Kx at 47 (pt had PT earlier this year for 8 visits total).   PT Start Time 1400   PT Stop Time 1416   PT Time Calculation (min) 16 min   Activity Tolerance Patient tolerated treatment well   Behavior During Therapy WFL for tasks assessed/performed      Past Medical History  Diagnosis Date  . Arthritis   . Depression   . Chicken pox   . Diverticulitis   . Allergy   . Arrhythmia   . Hypertension   . Colon polyps   . Thyroid disease     Past Surgical History  Procedure Laterality Date  . Total hip arthroplasty  09/21/06    right  . Other surgical history  09/01/10    spine decompression  . Spine surgery  09/11/10  . Prostate surgery  01/28/13    laser    There were no vitals filed for this visit.      Subjective Assessment - 10/29/15 1405    Subjective Pt perceives no dizziness since initial evaluation.   Pertinent History Hx of laminectomy, HTN, depression, arthritis   Patient Stated Goals "I don't know why I am here."   Currently in Pain? No/denies                Vestibular Assessment - 10/29/15 0001    Positional Testing   Sidelying Test Sidelying Right;Sidelying Left   Dix-Hallpike Right   Dix-Hallpike Right Duration --   Dix-Hallpike Left   Dix-Hallpike Left Duration Attempted Marye Round; however, unable to maintain position for > 5 seconds due to significant back pain.   Sidelying Right   Sidelying Right Duration NA   Sidelying Right Symptoms No  nystagmus   Sidelying Left   Sidelying Left Duration NA   Sidelying Left Symptoms No nystagmus                  Vestibular Treatment/Exercise - 10/29/15 0001    Vestibular Treatment/Exercise   Vestibular Treatment Provided Habituation   Habituation Exercises [place]   Number of Reps  1   Symptom Description  Explained and demonstrated with effective return demo from pt.               PT Education - 10/29/15 1418    Education provided Yes   Education Details Goals, findings, progress, and DC plan. Explained, demonstrated Nestor Lewandowsky for haibtuation if pt experiences positional vertigo in the future. Explaned and provided paper handout on situations in which pt should seek medical attention.    Person(s) Educated Patient   Methods Explanation;Demonstration;Handout;Verbal cues   Comprehension Verbalized understanding;Returned demonstration          PT Short Term Goals - 10/12/15 1618    PT SHORT TERM GOAL #1   Title same as LTGs.           PT Long Term Goals - 10/29/15 1410    PT LONG TERM GOAL #1  Title Pt will report 0/10 dizziness  during position changes (supine<>sit and STS) to improve quality of life and safely perform ADLs. Target date: 11/09/15   Baseline Met 10-31-22.   Status Achieved   PT LONG TERM GOAL #2   Title Positional testing will be negative for nystagmus or dizziness in order for pt to perform bed mobility without dizziness. Target date: 11/09/15   Baseline Met 10-31-22.   Status Achieved             Patient will benefit from skilled therapeutic intervention in order to improve the following deficits and impairments:     Visit Diagnosis: BPPV (benign paroxysmal positional vertigo), left       G-Codes - 10-31-2015 1426    Functional Assessment Tool Used No dizziness during L Dix-Hallpike, bed mobility, or transitional movements   Changing and Maintaining Body Position Current Status (T8004) At least 1  percent but less than 20 percent impaired, limited or restricted   Changing and Maintaining Body Position Goal Status (Y7158) At least 1 percent but less than 20 percent impaired, limited or restricted      Problem List Patient Active Problem List   Diagnosis Date Noted  . Footdrop 10/05/2015  . Depression 03/29/2015  . Postlaminectomy syndrome, lumbar region 01/09/2014  . Other secondary scoliosis, thoracolumbar region 01/09/2014  . Viral URI with cough 12/16/2013  . Rapid heart rate 08/05/2013  . Chronic radicular low back pain 06/03/2013  . Unspecified hypothyroidism 06/03/2013  . Accelerated hypertension 06/03/2013  . Insomnia 06/03/2013  . BPH without urinary obstruction 06/03/2013   PHYSICAL THERAPY DISCHARGE SUMMARY  Visits from Start of Care: 2  Current functional level related to goals / functional outcomes: See above.   Remaining deficits: No remaining deficits in reference to dizziness, vertiginous symptoms.   Education / Equipment: See Patient Education section above.  Plan: Patient agrees to discharge.  Patient goals were met. Patient is being discharged due to meeting the stated rehab goals.  ?????       Billie Ruddy, PT, DPT [facility] [address] [place], [place], [address] Phone: [phone]   Fax:  [phone] 10-31-15, 2:29 PM  Name: Ronald Strong MRN: [id number] Date of Birth: [birth year]

## 2016-01-04 ENCOUNTER — Ambulatory Visit (HOSPITAL_BASED_OUTPATIENT_CLINIC_OR_DEPARTMENT_OTHER): Payer: Medicare Other | Admitting: Physical Medicine & Rehabilitation

## 2016-01-04 ENCOUNTER — Encounter: Payer: Medicare Other | Attending: Physical Medicine & Rehabilitation

## 2016-01-04 ENCOUNTER — Encounter: Payer: Self-pay | Admitting: Physical Medicine & Rehabilitation

## 2016-01-04 VITALS — BP 104/63 | HR 77

## 2016-01-04 DIAGNOSIS — Z87891 Personal history of nicotine dependence: Secondary | ICD-10-CM | POA: Insufficient documentation

## 2016-01-04 DIAGNOSIS — H8110 Benign paroxysmal vertigo, unspecified ear: Secondary | ICD-10-CM | POA: Insufficient documentation

## 2016-01-04 DIAGNOSIS — M961 Postlaminectomy syndrome, not elsewhere classified: Secondary | ICD-10-CM

## 2016-01-04 DIAGNOSIS — G8929 Other chronic pain: Secondary | ICD-10-CM | POA: Insufficient documentation

## 2016-01-04 DIAGNOSIS — M1990 Unspecified osteoarthritis, unspecified site: Secondary | ICD-10-CM | POA: Diagnosis not present

## 2016-01-04 DIAGNOSIS — E079 Disorder of thyroid, unspecified: Secondary | ICD-10-CM | POA: Diagnosis not present

## 2016-01-04 DIAGNOSIS — M4155 Other secondary scoliosis, thoracolumbar region: Secondary | ICD-10-CM

## 2016-01-04 DIAGNOSIS — Z86010 Personal history of colonic polyps: Secondary | ICD-10-CM | POA: Diagnosis not present

## 2016-01-04 DIAGNOSIS — I499 Cardiac arrhythmia, unspecified: Secondary | ICD-10-CM | POA: Insufficient documentation

## 2016-01-04 DIAGNOSIS — F329 Major depressive disorder, single episode, unspecified: Secondary | ICD-10-CM | POA: Diagnosis not present

## 2016-01-04 DIAGNOSIS — I10 Essential (primary) hypertension: Secondary | ICD-10-CM | POA: Insufficient documentation

## 2016-01-04 DIAGNOSIS — M545 Low back pain: Secondary | ICD-10-CM | POA: Insufficient documentation

## 2016-01-04 DIAGNOSIS — M21372 Foot drop, left foot: Secondary | ICD-10-CM | POA: Insufficient documentation

## 2016-01-04 MED ORDER — HYDROCODONE-ACETAMINOPHEN 5-325 MG PO TABS: 1.0000 | ORAL_TABLET | Freq: Every day | ORAL | 0 refills | Status: DC

## 2016-01-04 NOTE — Patient Instructions (Signed)
May take hydrocodone 5 mg after breakfast. Hold on the tramadol  We'll schedule you for bio wave, but we'll first need to get insurance approval

## 2016-01-04 NOTE — Progress Notes (Addendum)
Subjective:    Patient ID: Ronald Strong, male    DOB: April 08, 1921, 82 y.o.   MRN: [id number]  HPI Ronald Strong returns today 82 year old male with history of lumbar spondylosis and postlaminectomy syndrome. He has tried tramadol and has tried physical therapy without much relief. He feels like his back pain is interfering with his functional activities  Pain Inventory Average Pain 8 Pain Right Now 9 My pain is intermittent and sharp  In the last 24 hours, has pain interfered with the following? General activity 9 Relation with others 9 Enjoyment of life 10 What TIME of day is your pain at its worst? morning, daytime, evening Sleep (in general) Fair  Pain is worse with: walking, standing and some activites Pain improves with: rest and medication Relief from Meds: very little  Mobility walk without assistance how many minutes can you walk? 5-7 ability to climb steps?  yes do you drive?  yes  Function retired Do you have any goals in this area?  no  Neuro/Psych trouble walking depression anxiety  Prior Studies Any changes since last visit?  no  Physicians involved in your care Any changes since last visit?  no   Family History  Problem Relation Age of Onset  . Arthritis Other   . Heart disease Other   . Hypertension Other    Social History   Social History  . Marital status: Married    Spouse name: N/A  . Number of children: N/A  . Years of education: N/A   Social History Main Topics  . Smoking status: Former Smoker    Quit date: 06/04/1983  . Smokeless tobacco: Never Used  . Alcohol use Yes  . Drug use: No  . Sexual activity: Not Asked   Other Topics Concern  . None   Social History Narrative  . None   Past Surgical History:  Procedure Laterality Date  . OTHER SURGICAL HISTORY  09/01/10   spine decompression  . PROSTATE SURGERY  01/28/13   laser  . SPINE SURGERY  09/11/10  . TOTAL HIP ARTHROPLASTY  09/21/06   right   Past Medical History:    Diagnosis Date  . Allergy   . Arrhythmia   . Arthritis   . Chicken pox   . Colon polyps   . Depression   . Diverticulitis   . Hypertension   . Thyroid disease    BP 104/63 (BP Location: Right Arm, Patient Position: Sitting, Cuff Size: Normal)   Pulse 77   SpO2 92%   Opioid Risk Score:   Fall Risk Score:  `1  Depression screen PHQ 2/9  Depression screen [facility] 2/9 05/11/2015 06/03/2013  Decreased Interest 0 0  Down, Depressed, Hopeless 0 0  PHQ - 2 Score 0 0    Review of Systems  Constitutional: Negative.   HENT: Negative.   Eyes: Negative.   Respiratory: Negative.   Cardiovascular: Negative.   Gastrointestinal: Negative.   Endocrine: Negative.   Genitourinary: Negative.   Musculoskeletal: Positive for back pain and gait problem.  Skin: Negative.   Allergic/Immunologic: Negative.   Hematological: Negative.   Psychiatric/Behavioral: Positive for dysphoric mood. The patient is nervous/anxious.   All other systems reviewed and are negative.      Objective:   Physical Exam kyphotic male in no acute distress. Mood and affect are blunt Negative straight leg raising test Motor strength is 5/5 bilateral hip flexor, knee extensor, ankle dorsiflexor Gait shows no evidence of toe drag or knee instability Lumbar  spine has no tenderness to palpation lumbar paraspinal muscles. No tenderness of the PSIS. No tenderness over the greater trochanters. He has limited lumbar range of motion with flexion, extension, lateral bending and rotation.    Assessment & Plan:  1. Lumbar post laminotomy syndrome with chronic postoperative pain. He has mainly axial pain. He has failed lumbar injections including radiofrequency neurotomy, has had some partial relief with acupuncture in the past as well as short-term relief with TENS. He would be a good candidate for percutaneous lead from her stimulation. In the meantime, will resume hydrocodone 5 mg per day. He will take this in the morning. He has  activity-related pain mainly. He has been on this medication past and is tolerated well. We discussed fall risk given his age.

## 2016-01-11 ENCOUNTER — Ambulatory Visit (INDEPENDENT_AMBULATORY_CARE_PROVIDER_SITE_OTHER): Payer: Medicare Other | Admitting: Family Medicine

## 2016-01-11 ENCOUNTER — Encounter: Payer: Self-pay | Admitting: Family Medicine

## 2016-01-11 DIAGNOSIS — H109 Unspecified conjunctivitis: Secondary | ICD-10-CM

## 2016-01-11 DIAGNOSIS — H1089 Other conjunctivitis: Secondary | ICD-10-CM

## 2016-01-11 DIAGNOSIS — I10 Essential (primary) hypertension: Secondary | ICD-10-CM | POA: Diagnosis not present

## 2016-01-11 DIAGNOSIS — E039 Hypothyroidism, unspecified: Secondary | ICD-10-CM

## 2016-01-11 DIAGNOSIS — A499 Bacterial infection, unspecified: Secondary | ICD-10-CM | POA: Diagnosis not present

## 2016-01-11 LAB — BASIC METABOLIC PANEL
BUN: 27 mg/dL — ABNORMAL HIGH (ref 6–23)
CALCIUM: 8.9 mg/dL (ref 8.4–10.5)
CO2: 32 mEq/L (ref 19–32)
Chloride: 105 mEq/L (ref 96–112)
Creatinine, Ser: 1.49 mg/dL (ref 0.40–1.50)
GFR: 46.78 mL/min — AB (ref >60.00)
Glucose, Bld: 82 mg/dL (ref 70–99)
POTASSIUM: 5 meq/L (ref 3.5–5.1)
SODIUM: 139 meq/L (ref 135–145)

## 2016-01-11 LAB — TSH: TSH: 15.31 u[IU]/mL — AB (ref 0.35–4.50)

## 2016-01-11 MED ORDER — POLYMYXIN B-TRIMETHOPRIM 10000-0.1 UNIT/ML-% OP SOLN: 2.0000 [drp] | OPHTHALMIC | 0 refills | Status: DC

## 2016-01-11 NOTE — Progress Notes (Signed)
Subjective:     Patient ID: Ronald Strong, male   DOB: November 29, 1920, 82 y.o.   MRN: [id number]  HPI Patient seen for the following issues  3-4 day history of some bilateral eye irritation. He's had some matting and yellowish drainage left eye greater than right. No blurred vision. No eye pain. Mild itching. He has used warm compresses. He states his eyelids have been matted together early in the mornings. Denies any sinusitis symptoms.  History of hypothyroidism. He is on Synthroid 125 migrans daily. Has not had TSH in over 2 years. Compliant with therapy  Hypertension treated with Maxzide. No orthostasis. Blood pressures been stable. No chest pains. Also takes verapamil.  Past Medical History:  Diagnosis Date  . Allergy   . Arrhythmia   . Arthritis   . Chicken pox   . Colon polyps   . Depression   . Diverticulitis   . Hypertension   . Thyroid disease    Past Surgical History:  Procedure Laterality Date  . OTHER SURGICAL HISTORY  09/01/10   spine decompression  . PROSTATE SURGERY  01/28/13   laser  . SPINE SURGERY  09/11/10  . TOTAL HIP ARTHROPLASTY  09/21/06   right    reports that he quit smoking about 32 years ago. He has never used smokeless tobacco. He reports that he drinks alcohol. He reports that he does not use drugs. family history includes Arthritis in his other; Heart disease in his other; Hypertension in his other. Allergies  Allergen Reactions  . Bee Venom Anaphylaxis  . Epinephrine Anaphylaxis    severe anaphylaxis     Review of Systems  Constitutional: Negative for chills, fatigue, fever and unexpected weight change.  Eyes: Positive for discharge, redness and itching. Negative for visual disturbance.  Respiratory: Negative for cough, chest tightness and shortness of breath.   Cardiovascular: Negative for chest pain, palpitations and leg swelling.  Neurological: Negative for dizziness, syncope, weakness, light-headedness and headaches.       Objective:   Physical Exam  Constitutional: He appears well-developed and well-nourished.  HENT:  Mouth/Throat: Oropharynx is clear and moist.  Eyes: EOM are normal.  Patient has minimal conjunctival erythema bilaterally. No purulent drainage noted at this time.  Cardiovascular: Normal rate and regular rhythm.   Pulmonary/Chest: Effort normal and breath sounds normal. No respiratory distress. He has no wheezes. He has no rales.       Assessment:     #1 probable bilateral bacterial conjunctivitis  #2 hypertension stable and at goal  #3 hypothyroidism      Plan:     -Polytrim ophthalmic drops every 4 hours while awake -Continue warm compresses several times daily -Check TSH and basic metabolic panel -Discussed flu vaccine and patient prefers to wait until October  Eulas Post MD [facility]

## 2016-01-11 NOTE — Patient Instructions (Signed)
Continue with warm compresses several times daily Touch base in 3-4 days if no better.

## 2016-01-12 ENCOUNTER — Other Ambulatory Visit: Payer: Self-pay

## 2016-01-12 MED ORDER — SYNTHROID 137 MCG PO TABS: 137.0000 ug | ORAL_TABLET | Freq: Every day | ORAL | 1 refills | Status: DC

## 2016-01-12 NOTE — Addendum Note (Signed)
Addended by: Elio Forget on: 01/12/2016 05:28 PM   Modules accepted: Orders

## 2016-01-27 ENCOUNTER — Telehealth: Payer: Self-pay | Admitting: Family Medicine

## 2016-01-27 NOTE — Telephone Encounter (Signed)
If not better in 2 weeks, needs to be reassessed.  Would set up to see his primary.

## 2016-01-27 NOTE — Telephone Encounter (Signed)
Pt is concerned about the eye drops that was prescribed they are not working having a hard time with them and would like something else.  Pt wanted to let Dr. Elease Hashimoto know because he was the prescriber.  Pharm:  [facility]

## 2016-01-28 NOTE — Telephone Encounter (Signed)
I left a message for the pt to return my call. 

## 2016-02-01 ENCOUNTER — Ambulatory Visit: Payer: Medicare Other | Admitting: Registered Nurse

## 2016-02-01 NOTE — Telephone Encounter (Signed)
Checked on pt and he is doing much better.

## 2016-02-02 ENCOUNTER — Ambulatory Visit: Payer: Medicare Other | Admitting: Registered Nurse

## 2016-02-04 ENCOUNTER — Ambulatory Visit: Payer: Medicare Other | Admitting: Registered Nurse

## 2016-02-07 ENCOUNTER — Ambulatory Visit: Payer: Medicare Other | Admitting: Registered Nurse

## 2016-02-11 ENCOUNTER — Ambulatory Visit: Payer: Medicare Other | Admitting: Registered Nurse

## 2016-02-14 ENCOUNTER — Ambulatory Visit: Payer: Medicare Other | Admitting: Registered Nurse

## 2016-02-14 ENCOUNTER — Ambulatory Visit (INDEPENDENT_AMBULATORY_CARE_PROVIDER_SITE_OTHER): Payer: Medicare Other | Admitting: Emergency Medicine

## 2016-02-14 DIAGNOSIS — Z23 Encounter for immunization: Secondary | ICD-10-CM

## 2016-02-15 ENCOUNTER — Ambulatory Visit: Payer: Medicare Other | Admitting: Emergency Medicine

## 2016-02-16 ENCOUNTER — Ambulatory Visit: Payer: Medicare Other | Admitting: Registered Nurse

## 2016-02-17 ENCOUNTER — Ambulatory Visit: Payer: Medicare Other | Admitting: Registered Nurse

## 2016-02-17 ENCOUNTER — Telehealth: Payer: Self-pay | Admitting: Family Medicine

## 2016-02-17 NOTE — Telephone Encounter (Signed)
May refill Polytrim ophthalmic drops and use 2 drops to affected eye every 4 hours prn.

## 2016-02-17 NOTE — Telephone Encounter (Signed)
Ronald Strong came in the office saying his left eye is infected again. He's wondering if he can either have a refill of the medication he had before or if a new medication can be sent to his pharmacy. He didn't want to schedule an appointment until he heard from Ronald Strong and due to having to take care of his wife. Please give him a phone call regarding this.  Pt's ph# [phone] Thank you.

## 2016-02-18 MED ORDER — TOBRAMYCIN 0.3 % OP SOLN: OPHTHALMIC | 0 refills | Status: DC

## 2016-02-18 NOTE — Telephone Encounter (Signed)
I called the pt and informed her daughter of the message below and his daughter talked with the pts wife and stated Polytrim is the same drops he was given before.  Patient's daughter stated his eye never got better before and this made his eye worse and they questioned if he is allergic to this?

## 2016-02-18 NOTE — Telephone Encounter (Signed)
Try Tobrex eye drop 2 gtts to affected eye qid and needs follow up by next week if no better.

## 2016-02-18 NOTE — Telephone Encounter (Signed)
I called the pts wife and informed her of the message below and she is aware [facility]

## 2016-02-22 ENCOUNTER — Ambulatory Visit: Payer: Medicare Other | Admitting: Registered Nurse

## 2016-02-24 ENCOUNTER — Ambulatory Visit: Payer: Medicare Other | Admitting: Registered Nurse

## 2016-02-28 ENCOUNTER — Ambulatory Visit: Payer: Medicare Other | Admitting: Registered Nurse

## 2016-02-29 ENCOUNTER — Ambulatory Visit: Payer: Medicare Other | Admitting: Registered Nurse

## 2016-03-14 ENCOUNTER — Ambulatory Visit (INDEPENDENT_AMBULATORY_CARE_PROVIDER_SITE_OTHER): Payer: Medicare Other | Admitting: Family Medicine

## 2016-03-14 ENCOUNTER — Encounter: Payer: Self-pay | Admitting: Family Medicine

## 2016-03-14 VITALS — BP 108/62 | HR 84 | Temp 97.9°F | Wt 156.2 lb

## 2016-03-14 DIAGNOSIS — H10402 Unspecified chronic conjunctivitis, left eye: Secondary | ICD-10-CM

## 2016-03-14 NOTE — Patient Instructions (Signed)
Call the office of Dr. Calvert Cantor ophthalmologist [phone] and asked to be seen ASAP,,,,,,,,,,, let them know I referred you

## 2016-03-14 NOTE — Progress Notes (Signed)
Ronald Strong is a 82 year old married male nonsmoker who comes in today for reevaluation of conjunctivitis in his left eye  He was seen here couple weeks ago was recently given Polysporin ophthalmic drops in his eye did not improve. He was and given Tobrex which he used to drops 2-3 times daily for a week and it still not improved. Has no trouble with his vision. The complaint is redness and discharge. No history of trauma.  He had this many years ago her cleared up with some minor drops.  He doesn't have an ophthalmologist here  Physical evaluation vital signs stable he is afebrile examination the eyes shows the conjunctivae to be red with some pus. Consistent with a conjunctivitis  #1 conjunctivitis left eye and not resolving with typical therapy,,,,,,,,,,,,, ophthalmologic consult

## 2016-03-14 NOTE — Progress Notes (Signed)
Pre visit review using our clinic review tool, if applicable. No additional management support is needed unless otherwise documented below in the visit note. 

## 2016-03-15 DIAGNOSIS — Z961 Presence of intraocular lens: Secondary | ICD-10-CM | POA: Diagnosis not present

## 2016-03-15 DIAGNOSIS — H10433 Chronic follicular conjunctivitis, bilateral: Secondary | ICD-10-CM | POA: Diagnosis not present

## 2016-03-21 ENCOUNTER — Other Ambulatory Visit: Payer: Self-pay | Admitting: Family Medicine

## 2016-03-28 DIAGNOSIS — H10433 Chronic follicular conjunctivitis, bilateral: Secondary | ICD-10-CM | POA: Diagnosis not present

## 2016-04-17 ENCOUNTER — Telehealth: Payer: Self-pay | Admitting: *Deleted

## 2016-04-17 ENCOUNTER — Other Ambulatory Visit (INDEPENDENT_AMBULATORY_CARE_PROVIDER_SITE_OTHER): Payer: Medicare Other

## 2016-04-17 DIAGNOSIS — Z66 Do not resuscitate: Secondary | ICD-10-CM

## 2016-04-17 DIAGNOSIS — E039 Hypothyroidism, unspecified: Secondary | ICD-10-CM

## 2016-04-17 LAB — TSH: TSH: 3.51 u[IU]/mL (ref 0.35–4.50)

## 2016-04-17 NOTE — Telephone Encounter (Signed)
Patient walked in today requesting him and his wife be made to a DNR code status. Spoke with his PCP, Dr. Sherren Mocha and Dr. Sherren Mocha confirm his wife is in hospice care and he is aware of the patient's requests to change to a DNR status.   Dr. Sherren Mocha was given two DNR forms and completed each form, one for his wife Zipporah [employer] [place]) and the patient Ronald Strong). Confirmed with Dr. Sherren Mocha the patient's inability to come to the office to pick up form in person and to address status change. Dr. Sherren Mocha reiterated patient was in hospice care and not doing well.   Patient was brought back into an exam room and was explained what a DNR code status meant. Patient verbalized understanding.   Both DNR forms were scanned into the chart and DNR code change order will be placed with provider sign off required.

## 2016-04-18 ENCOUNTER — Other Ambulatory Visit: Payer: Self-pay

## 2016-04-18 DIAGNOSIS — E039 Hypothyroidism, unspecified: Secondary | ICD-10-CM

## 2016-04-26 DIAGNOSIS — Z961 Presence of intraocular lens: Secondary | ICD-10-CM | POA: Diagnosis not present

## 2016-04-26 DIAGNOSIS — H10433 Chronic follicular conjunctivitis, bilateral: Secondary | ICD-10-CM | POA: Diagnosis not present

## 2016-05-31 ENCOUNTER — Other Ambulatory Visit: Payer: Self-pay

## 2016-05-31 NOTE — Telephone Encounter (Signed)
Received a faxed medication request for tramadol 50mg  for this patient, your last note said to hold on this med as hydrocodone was being administered, this was back in September, since that time this patient has missed all appointments with Zella Ball and his next appointment is is on 20 Feb with Dr. Letta Pate.   Please advise.

## 2016-06-01 MED ORDER — TRAMADOL HCL 50 MG PO TABS: 50.0000 mg | ORAL_TABLET | Freq: Three times a day (TID) | ORAL | 0 refills | Status: DC | PRN

## 2016-06-01 NOTE — Telephone Encounter (Signed)
Verified on [place] that the patient has not received any narcotic medication since nov 2017.  Called Tramadol 50mg  to his pharmacy.

## 2016-06-01 NOTE — Telephone Encounter (Signed)
Please clarify if pt is still taking hydrocodone, if not we can call in tramadol 50mg  TID, #90 no refill to cover until appt

## 2016-06-04 ENCOUNTER — Other Ambulatory Visit: Payer: Self-pay | Admitting: Family Medicine

## 2016-06-06 ENCOUNTER — Other Ambulatory Visit: Payer: Self-pay | Admitting: Family Medicine

## 2016-06-06 DIAGNOSIS — I10 Essential (primary) hypertension: Secondary | ICD-10-CM

## 2016-06-20 ENCOUNTER — Ambulatory Visit: Payer: Medicare Other

## 2016-06-20 ENCOUNTER — Ambulatory Visit: Payer: Medicare Other | Admitting: Physical Medicine & Rehabilitation

## 2016-06-23 ENCOUNTER — Ambulatory Visit (HOSPITAL_BASED_OUTPATIENT_CLINIC_OR_DEPARTMENT_OTHER): Payer: Medicare Other | Admitting: Physical Medicine & Rehabilitation

## 2016-06-23 ENCOUNTER — Encounter: Payer: Self-pay | Admitting: Physical Medicine & Rehabilitation

## 2016-06-23 ENCOUNTER — Encounter: Payer: Medicare Other | Attending: Physical Medicine & Rehabilitation

## 2016-06-23 VITALS — BP 104/62 | HR 78

## 2016-06-23 DIAGNOSIS — M961 Postlaminectomy syndrome, not elsewhere classified: Secondary | ICD-10-CM | POA: Diagnosis not present

## 2016-06-23 DIAGNOSIS — Z87891 Personal history of nicotine dependence: Secondary | ICD-10-CM | POA: Diagnosis not present

## 2016-06-23 DIAGNOSIS — G894 Chronic pain syndrome: Secondary | ICD-10-CM | POA: Insufficient documentation

## 2016-06-23 MED ORDER — ACETAMINOPHEN-CODEINE #3 300-30 MG PO TABS: 1.0000 | ORAL_TABLET | Freq: Three times a day (TID) | ORAL | 2 refills | Status: DC | PRN

## 2016-06-23 NOTE — Patient Instructions (Addendum)
Discontinue tramadol  Do not drink alcohol with codeine medication   Please do rowing exercise with theraband

## 2016-06-23 NOTE — Progress Notes (Signed)
Subjective:    Patient ID: Ronald Strong, male    DOB: 22-Sep-1920, 83 y.o.   MRN: [id number]  HPI  83 year old male with history of lumbar postlaminectomy syndrome and thoracic kyphosis. He has chronic low back pain without radicular symptoms. He states his posture deteriorates. Should the day and gets more bent forward. He is caring for his wife with the aid of hospice. This is been difficult emotionally as well as physically for him.  Patient feels like the tramadol is not very beneficial for his pain. He takes 50 mg 3 times per day. He has had an alcoholic beverage at night to help him sleep.  Still does acupuncture but only occasionally, used to do twice a month, which was helpful for him   Pain Inventory Average Pain 8 Pain Right Now 9 My pain is aching  In the last 24 hours, has pain interfered with the following? General activity 8 Relation with others 9 Enjoyment of life 10 What TIME of day is your pain at its worst? morning, daytime, evening Sleep (in general) Fair  Pain is worse with: walking and standing Pain improves with: rest and medication Relief from Meds: 4  Mobility walk without assistance how many minutes can you walk? 7 ability to climb steps?  yes do you drive?  yes  Function retired  Neuro/Psych trouble walking depression  Prior Studies Any changes since last visit?  no  Physicians involved in your care Any changes since last visit?  no   Family History  Problem Relation Age of Onset  . Arthritis Other   . Heart disease Other   . Hypertension Other    Social History   Social History  . Marital status: Married    Spouse name: N/A  . Number of children: N/A  . Years of education: N/A   Social History Main Topics  . Smoking status: Former Smoker    Quit date: 06/04/1983  . Smokeless tobacco: Never Used  . Alcohol use Yes  . Drug use: No  . Sexual activity: Not Asked   Other Topics Concern  . None   Social History Narrative    . None   Past Surgical History:  Procedure Laterality Date  . OTHER SURGICAL HISTORY  09/01/10   spine decompression  . PROSTATE SURGERY  01/28/13   laser  . SPINE SURGERY  09/11/10  . TOTAL HIP ARTHROPLASTY  09/21/06   right   Past Medical History:  Diagnosis Date  . Allergy   . Arrhythmia   . Arthritis   . Chicken pox   . Colon polyps   . Depression   . Diverticulitis   . Hypertension   . Thyroid disease    BP 104/62 (BP Location: Left Arm, Patient Position: Sitting, Cuff Size: Normal)   Pulse 78   SpO2 93%   Opioid Risk Score:   Fall Risk Score:  `1  Depression screen PHQ 2/9  Depression screen [facility] 2/9 05/11/2015 06/03/2013  Decreased Interest 0 0  Down, Depressed, Hopeless 0 0  PHQ - 2 Score 0 0    Review of Systems  Constitutional: Negative.   HENT: Negative.   Eyes: Negative.   Respiratory: Negative.   Cardiovascular: Negative.   Gastrointestinal: Negative.   Endocrine: Negative.   Genitourinary: Negative.   Musculoskeletal: Positive for arthralgias, back pain, gait problem and myalgias.  Allergic/Immunologic: Negative.   Hematological: Negative.   Psychiatric/Behavioral: Positive for dysphoric mood.  All other systems reviewed and are negative.  Objective:   Physical Exam  Constitutional: He is oriented to person, place, and time. He appears well-developed and well-nourished.  HENT:  Head: Normocephalic and atraumatic.  Eyes: Pupils are equal, round, and reactive to light.  Musculoskeletal:       Thoracic back: He exhibits decreased range of motion and deformity.       Lumbar back: He exhibits decreased range of motion and deformity. He exhibits no tenderness.  Decreased flexion, extension, lateral bending and rotation. Kyphosis with a prominent spinous process at L1  Neurological: He is alert and oriented to person, place, and time.  Psychiatric: He has a normal mood and affect.  Nursing note and vitals reviewed.         Assessment &  Plan:  #1. Lumbar postlaminectomy syndrome with chronic low back pain. In addition, patient has thoracic kyphosis.  We discussed his posture and the need to perform Thera-Band exercises working on the interscapular musculature and thoracic spine extensors.  2. Chronic pain syndrome. At this point does not feel like he gets much relief with the tramadol. Low opioid risk. Will trial Tylenol with codeine. Recheck in 3 months with UDS as well as controlled substance agreement signed at that time as well as an [place] check

## 2016-06-29 ENCOUNTER — Other Ambulatory Visit: Payer: Self-pay | Admitting: Family Medicine

## 2016-06-30 ENCOUNTER — Telehealth: Payer: Self-pay | Admitting: Family Medicine

## 2016-06-30 ENCOUNTER — Encounter: Payer: Self-pay | Admitting: Family Medicine

## 2016-06-30 NOTE — Telephone Encounter (Signed)
I will write for a temporary place card but his PCP needs to write it for an extended period of time

## 2016-06-30 NOTE — Telephone Encounter (Signed)
I have spoke with patient's daughter, and she is very adamant that the placard be written for 5 years.  Ronald Strong has not seen this patient, and patient does not have plans to establish with a new provider - and Ronald Strong will only write for temporary placard.  Would you mind helping me with this?

## 2016-06-30 NOTE — Telephone Encounter (Signed)
See below and advise

## 2016-06-30 NOTE — Telephone Encounter (Signed)
Pts daughter Contractor) is calling to see if someone could write a presciption for a renewal of a handicap placard for her father.  They will be leaving to go to [place] this weekend to bury the pts wife and would like to see if they could get it before leaving.  The prescription should state (pts name, length of placard -5 yrs and why -disabled).

## 2016-07-01 ENCOUNTER — Emergency Department (HOSPITAL_COMMUNITY)
Admission: EM | Admit: 2016-07-01 | Discharge: 2016-07-01 | Disposition: A | Discharge status: Home / Self Care | Payer: Medicare Other | Attending: Emergency Medicine | Admitting: Emergency Medicine

## 2016-07-01 ENCOUNTER — Emergency Department (HOSPITAL_BASED_OUTPATIENT_CLINIC_OR_DEPARTMENT_OTHER)
Admit: 2016-07-01 | Discharge: 2016-07-01 | Disposition: A | Discharge status: Home / Self Care | Payer: Medicare Other | Attending: Emergency Medicine | Admitting: Emergency Medicine

## 2016-07-01 ENCOUNTER — Encounter (HOSPITAL_COMMUNITY): Payer: Self-pay | Admitting: Emergency Medicine

## 2016-07-01 DIAGNOSIS — M79661 Pain in right lower leg: Secondary | ICD-10-CM | POA: Diagnosis not present

## 2016-07-01 DIAGNOSIS — Z79899 Other long term (current) drug therapy: Secondary | ICD-10-CM | POA: Insufficient documentation

## 2016-07-01 DIAGNOSIS — E039 Hypothyroidism, unspecified: Secondary | ICD-10-CM | POA: Diagnosis not present

## 2016-07-01 DIAGNOSIS — M79604 Pain in right leg: Secondary | ICD-10-CM

## 2016-07-01 DIAGNOSIS — Z7982 Long term (current) use of aspirin: Secondary | ICD-10-CM | POA: Diagnosis not present

## 2016-07-01 DIAGNOSIS — I10 Essential (primary) hypertension: Secondary | ICD-10-CM | POA: Insufficient documentation

## 2016-07-01 DIAGNOSIS — Z96641 Presence of right artificial hip joint: Secondary | ICD-10-CM | POA: Insufficient documentation

## 2016-07-01 DIAGNOSIS — Z87891 Personal history of nicotine dependence: Secondary | ICD-10-CM | POA: Insufficient documentation

## 2016-07-01 DIAGNOSIS — M79609 Pain in unspecified limb: Secondary | ICD-10-CM

## 2016-07-01 DIAGNOSIS — R52 Pain, unspecified: Secondary | ICD-10-CM | POA: Diagnosis not present

## 2016-07-01 LAB — I-STAT CHEM 8, ED
BUN: 32 mg/dL — AB (ref 6–20)
CALCIUM ION: 1.15 mmol/L (ref 1.15–1.40)
Chloride: 100 mmol/L — ABNORMAL LOW (ref 101–111)
Creatinine, Ser: 1.7 mg/dL — ABNORMAL HIGH (ref 0.61–1.24)
GLUCOSE: 108 mg/dL — AB (ref 65–99)
HCT: 43 % (ref 39.0–52.0)
Hemoglobin: 14.6 g/dL (ref 13.0–17.0)
Potassium: 4.1 mmol/L (ref 3.5–5.1)
Sodium: 141 mmol/L (ref 135–145)
TCO2: 29 mmol/L (ref 0–100)

## 2016-07-01 MED ORDER — OXYCODONE-ACETAMINOPHEN 5-325 MG PO TABS
1.0000 | ORAL_TABLET | Freq: Once | ORAL | Status: AC
  Administered 2016-07-01: 1 via ORAL
  Filled 2016-07-01: qty 1

## 2016-07-01 MED ORDER — HYDROCODONE-ACETAMINOPHEN 5-325 MG PO TABS: 1.0000 | ORAL_TABLET | Freq: Four times a day (QID) | ORAL | 0 refills | Status: DC | PRN

## 2016-07-01 NOTE — ED Notes (Signed)
Bed: WA01 Expected date:  Expected time:  Means of arrival:  Comments: 83 yo m right lower leg pain

## 2016-07-01 NOTE — ED Triage Notes (Signed)
Per EMS, patient is complaining of lower right leg pain starting 2 days ago. States he has been sitting cross-legged from long periods of time. Patient is from home.

## 2016-07-01 NOTE — ED Provider Notes (Signed)
[facility] DEPT Provider Note   CSN: [id number] Arrival date & time: 07/01/16  1243     History   Chief Complaint Chief Complaint  Patient presents with  . Leg Pain    Right    HPI Ronald Strong is a 83 y.o. male.  The history is provided by the patient and medical records.    83 year old male with history of sinus arrhythmia, arthritis, depression, hypertension, hypothyroidism, presenting to the ED for right lower leg pain. Patient reports he has recently been sitting with his wife who is ill. States he has been sitting in a chair at her bedside with his legs crossed, right upper left. States now he has a lot of pain behind the right knee is concerned he may be developing a clot. Pain does radiate up into the posterior thigh somewhat. He denies any numbness or weakness of his leg. He denies any history of DVT or PE. No chest pain or shortness of breath. States otherwise he is feeling fairly well. He has not had any falls or other trauma.  Past Medical History:  Diagnosis Date  . Allergy   . Arrhythmia   . Arthritis   . Chicken pox   . Colon polyps   . Depression   . Diverticulitis   . Hypertension   . Thyroid disease     Patient Active Problem List   Diagnosis Date Noted  . [place] 10/05/2015  . Depression 03/29/2015  . Postlaminectomy syndrome, lumbar region 01/09/2014  . Other secondary scoliosis, thoracolumbar region 01/09/2014  . Viral URI with cough 12/16/2013  . Rapid heart rate 08/05/2013  . Chronic radicular low back pain 06/03/2013  . Hypothyroidism 06/03/2013  . Accelerated hypertension 06/03/2013  . Insomnia 06/03/2013  . BPH without urinary obstruction 06/03/2013    Past Surgical History:  Procedure Laterality Date  . OTHER SURGICAL HISTORY  09/01/10   spine decompression  . PROSTATE SURGERY  01/28/13   laser  . SPINE SURGERY  09/11/10  . TOTAL HIP ARTHROPLASTY  09/21/06   right       Home Medications    Prior to Admission medications     Medication Sig Start Date End Date Taking? Authorizing Provider  acetaminophen-codeine (TYLENOL #3) 300-30 MG tablet Take 1 tablet by mouth every 8 (eight) hours as needed for moderate pain. 06/23/16   Charlett Blake, MD  aspirin 325 MG EC tablet Take 325 mg by mouth 2 (two) times daily.    Historical Provider, MD  Multiple Vitamin (MULTI VITAMIN DAILY PO) Take by mouth.    Historical Provider, MD  SYNTHROID 125 MCG tablet  12/25/15   Historical Provider, MD  SYNTHROID 137 MCG tablet TAKE 1 TABLET (137 MCG TOTAL) BY MOUTH DAILY BEFORE BREAKFAST. 06/29/16   Eulas Post, MD  tamsulosin (FLOMAX) 0.4 MG CAPS capsule TAKE 1 CAPSULE (0.4 MG TOTAL) BY MOUTH EVERY MORNING. 06/05/16   Dorena Cookey, MD  triamterene-hydrochlorothiazide [facility]) 37.5-25 MG tablet TAKE 1/2 TABLET ONCE DAILY 06/06/16   Dorena Cookey, MD  verapamil (CALAN) 120 MG tablet TAKE 1 TABLET (120 MG TOTAL) BY MOUTH EVERY MORNING. 06/05/16   Dorena Cookey, MD  zolpidem (AMBIEN) 5 MG tablet TAKE 1/2 TABLET BY MOUTH AT BEDTIME AS NEEDED FOR SLEEP 03/21/16   Dorena Cookey, MD    Family History Family History  Problem Relation Age of Onset  . Arthritis Other   . Heart disease Other   . Hypertension Other  Social History Social History  Substance Use Topics  . Smoking status: Former Smoker    Quit date: 06/04/1983  . Smokeless tobacco: Never Used  . Alcohol use Yes     Allergies   Bee venom and Epinephrine   Review of Systems Review of Systems  Musculoskeletal: Positive for arthralgias and myalgias.  All other systems reviewed and are negative.    Physical Exam Updated Vital Signs BP 112/69 (BP Location: Right Arm)   Pulse 78   Temp 97.7 F (36.5 C) (Oral)   Resp 18   Ht 5\' 5"  (1.651 m)   Wt 70.8 kg   SpO2 91%   BMI 25.96 kg/m   Physical Exam  Constitutional: He is oriented to person, place, and time. He appears well-developed and well-nourished.  HENT:  Head: Normocephalic and atraumatic.   Mouth/Throat: Oropharynx is clear and moist.  Eyes: Conjunctivae and EOM are normal. Pupils are equal, round, and reactive to light.  Neck: Normal range of motion.  Cardiovascular: Normal rate, regular rhythm and normal heart sounds.   Pulmonary/Chest: Effort normal and breath sounds normal.  Abdominal: Soft. Bowel sounds are normal.  Musculoskeletal: Normal range of motion.  Tenderness in the right popliteal area and distal posterior thigh without noted masses or swelling, there is no palpable cord, no overlying erythema or warmth to touch, DP pulse intact, moving both feet normally  Neurological: He is alert and oriented to person, place, and time.  Skin: Skin is warm and dry.  Psychiatric: He has a normal mood and affect.  Nursing note and vitals reviewed.    ED Treatments / Results  Labs (all labs ordered are listed, but only abnormal results are displayed) Labs Reviewed  I-STAT CHEM 8, ED - Abnormal; Notable for the following:       Result Value   Chloride 100 (*)    BUN 32 (*)    Creatinine, Ser 1.70 (*)    Glucose, Bld 108 (*)    All other components within normal limits    EKG  EKG Interpretation None       Radiology No results found.   VASCULAR LAB PRELIMINARY  PRELIMINARY  PRELIMINARY  PRELIMINARY  Right lower extremity venous duplex completed.    Preliminary report:  There is no DVT, SVT, or Baker's cyst noted in the right lower extremity.  Gave report to Quincy Carnes, PA-C  KANADY, Harrison, RVT 07/01/2016, 3:03 PM  Procedures Procedures (including critical care time)  Medications Ordered in ED Medications  oxyCODONE-acetaminophen (PERCOCET/ROXICET) 5-325 MG per tablet 1 tablet (not administered)     Initial Impression / Assessment and Plan / ED Course  I have reviewed the triage vital signs and the nursing notes.  Pertinent labs & imaging results that were available during my care of the patient were reviewed by me and considered in my  medical decision making (see chart for details).  83 year old male here with right lower leg pain. Has been sitting at his wife's bedside with his legs crossed for a few days.  On exam he does have some tenderness in the right popliteal area and distal posterior thigh, but there are no overlying skin changes, palpable cords, swelling, or warmth to touch.  I-stat chem 8 without electrolyte imbalance. Potassium is normal. Serum creatinine is 1.70, appears near baseline of 1.54 when compared with prior values. Venous duplex obtained, no evidence of DVT or Baker's cyst. This may be due to his recent sitting position. Has had good relief here  with pain medicine. Will discharge home with short supply of this. Encouraged close follow-up with PCP.  Discussed plan with patient, he acknowledged understanding and agreed with plan of care.  Return precautions given for new or worsening symptoms.  Case discussed with attending physician, Dr. Zenia Resides, who evaluated patient and agrees with assessment and plan of care.  Final Clinical Impressions(s) / ED Diagnoses   Final diagnoses:  Right leg pain    New Prescriptions New Prescriptions   HYDROCODONE-ACETAMINOPHEN (NORCO/VICODIN) 5-325 MG TABLET    Take 1 tablet by mouth every 6 (six) hours as needed.     Larene Pickett, PA-C 07/01/16 1518    Lacretia Leigh, MD 07/04/16 [phone]

## 2016-07-01 NOTE — Discharge Instructions (Signed)
Take the prescribed medication as directed. Use caution when taking this medication as it can make you sleepy/drowsy. Follow-up with your primary care doctor. Return to the ED for new or worsening symptoms.

## 2016-07-01 NOTE — ED Notes (Signed)
ED Provider at bedside. 

## 2016-07-01 NOTE — ED Notes (Signed)
Discharge instructions, follow up care, and rx x1 reviewed with patient. Patient verbalized understanding. 

## 2016-07-01 NOTE — ED Provider Notes (Signed)
Medical screening examination/treatment/procedure(s) were conducted as a shared visit with non-physician practitioner(s) and myself.  I personally evaluated the patient during the encounter.   EKG Interpretation None     Patient here with right lower leg pain 2 days. Patient's ultrasound negative for clot. Likely musculoskeletal etiology.   Lacretia Leigh, MD 07/01/16 1515

## 2016-07-01 NOTE — Progress Notes (Signed)
VASCULAR LAB PRELIMINARY  PRELIMINARY  PRELIMINARY  PRELIMINARY  Right lower extremity venous duplex completed.    Preliminary report:  There is no DVT, SVT, or Baker's cyst noted in the right lower extremity.  Gave report to Quincy Carnes, PA-C  Macaria Bias, RVT 07/01/2016, 3:03 PM

## 2016-07-03 ENCOUNTER — Other Ambulatory Visit: Payer: Self-pay | Admitting: Physical Medicine & Rehabilitation

## 2016-07-10 ENCOUNTER — Ambulatory Visit: Payer: Medicare Other | Admitting: Physical Medicine & Rehabilitation

## 2016-07-10 ENCOUNTER — Encounter: Payer: Self-pay | Admitting: Physical Medicine & Rehabilitation

## 2016-07-10 ENCOUNTER — Ambulatory Visit (HOSPITAL_BASED_OUTPATIENT_CLINIC_OR_DEPARTMENT_OTHER): Payer: Medicare Other | Admitting: Physical Medicine & Rehabilitation

## 2016-07-10 ENCOUNTER — Encounter: Payer: Medicare Other | Attending: Physical Medicine & Rehabilitation

## 2016-07-10 VITALS — BP 113/66 | HR 78 | Resp 16

## 2016-07-10 DIAGNOSIS — G894 Chronic pain syndrome: Secondary | ICD-10-CM | POA: Insufficient documentation

## 2016-07-10 DIAGNOSIS — G8929 Other chronic pain: Secondary | ICD-10-CM

## 2016-07-10 DIAGNOSIS — Z5181 Encounter for therapeutic drug level monitoring: Secondary | ICD-10-CM | POA: Diagnosis not present

## 2016-07-10 DIAGNOSIS — M961 Postlaminectomy syndrome, not elsewhere classified: Secondary | ICD-10-CM

## 2016-07-10 DIAGNOSIS — Z79899 Other long term (current) drug therapy: Secondary | ICD-10-CM

## 2016-07-10 DIAGNOSIS — M25461 Effusion, right knee: Secondary | ICD-10-CM

## 2016-07-10 DIAGNOSIS — Z87891 Personal history of nicotine dependence: Secondary | ICD-10-CM | POA: Diagnosis not present

## 2016-07-10 DIAGNOSIS — M5416 Radiculopathy, lumbar region: Secondary | ICD-10-CM | POA: Diagnosis not present

## 2016-07-10 NOTE — Progress Notes (Signed)
Subjective:    Patient ID: Ronald Strong, male    DOB: 1921/03/20, 83 y.o.   MRN: [id number]  HPI Right knee pain started after crossing his legs for prolonged per to time. Went to the emergency department at [facility], Doppler venous ultrasound was negative for blood clot. Patient still complaining of posterior knee pain and some swelling. No recent falls or trauma to that area.  Patient has very little pain while sitting, pain increases with walking, no history of knee surgery, has had hip surgery in the past.  Social history, wife died recently, patient still grieving Pain Inventory Average Pain 8 Pain Right Now 8 My pain is sharp  In the last 24 hours, has pain interfered with the following? General activity 2 Relation with others 5 Enjoyment of life 5 What TIME of day is your pain at its worst? morning Sleep (in general) Fair  Pain is worse with: walking and standing Pain improves with: not answered Relief from Meds: not  answered  Mobility use a cane ability to climb steps?  yes do you drive?  no  Function retired I need assistance with the following:  meal prep and household duties  Neuro/Psych No problems in this area  Prior Studies Any changes since last visit?  no  Physicians involved in your care Any changes since last visit?  no   Family History  Problem Relation Age of Onset  . Arthritis Other   . Heart disease Other   . Hypertension Other    Social History   Social History  . Marital status: Married    Spouse name: N/A  . Number of children: N/A  . Years of education: N/A   Social History Main Topics  . Smoking status: Former Smoker    Quit date: 06/04/1983  . Smokeless tobacco: Never Used  . Alcohol use Yes  . Drug use: No  . Sexual activity: Not Asked   Other Topics Concern  . None   Social History Narrative  . None   Past Surgical History:  Procedure Laterality Date  . OTHER SURGICAL HISTORY  09/01/10   spine  decompression  . PROSTATE SURGERY  01/28/13   laser  . SPINE SURGERY  09/11/10  . TOTAL HIP ARTHROPLASTY  09/21/06   right   Past Medical History:  Diagnosis Date  . Allergy   . Arrhythmia   . Arthritis   . Chicken pox   . Colon polyps   . Depression   . Diverticulitis   . Hypertension   . Thyroid disease    BP 113/66   Pulse 78   Resp 16   SpO2 91%   Opioid Risk Score:   Fall Risk Score:  `1  Depression screen PHQ 2/9  Depression screen [facility] 2/9 07/10/2016 05/11/2015 06/03/2013  Decreased Interest 0 0 0  Down, Depressed, Hopeless 0 0 0  PHQ - 2 Score 0 0 0    Review of Systems  Constitutional: Negative.   HENT: Negative.   Eyes: Negative.   Respiratory: Negative.   Cardiovascular: Negative.   Gastrointestinal: Negative.   Endocrine: Negative.   Genitourinary: Negative.   Musculoskeletal: Positive for gait problem.  Skin: Negative.   Hematological: Negative.   Psychiatric/Behavioral: Negative.   All other systems reviewed and are negative.      Objective:   Physical Exam  Constitutional: He is oriented to person, place, and time. He appears well-developed and well-nourished.  Eyes: Conjunctivae are normal. Pupils are equal,  round, and reactive to light.  Neurological: He is alert and oriented to person, place, and time.  Psychiatric: He has a normal mood and affect.  Nursing note and vitals reviewed.   Right posterior knee swelling as well as medial calf swelling. There is joint swelling medially Tenderness along the medial joint line tenderness in the popliteal fossa. Back has no tenderness with palpation. Negative straight leg raise     Assessment & Plan:  1. Right knee pain, mainly with standing, on exam appears to have Baker cyst now., Recommend arthrocentesis and injection. Follow-up in 2 weeks. May benefit from short-term nonsteroidal anti-inflammatory such as ibuprofen 400 mg 3 times a day with food for one week  Will recheck in 2 weeks, if not  much improvement would recommend ultrasound guided aspiration of Baker cyst  Knee injection Right   Indication: Right Knee pain not relieved by medication management and other conservative care.  Informed consent was obtained after describing risks and benefits of the procedure with the patient, this includes bleeding, bruising, infection and medication side effects. The patient wishes to proceed and has given written consent. The patient was placed in a recumbent position. The medial aspect of the knee was marked and prepped with Betadine and alcohol. It was then entered with a 25-gauge 1-1/2 inch needle and 1 mL of 1% lidocaine was injected into the skin and subcutaneous tissue. Then another 18 1.5 inch needle was inserted into the knee joint. After negative draw back for blood, a solution containing one ML of 6mg  per mL betamethasone and 3 mL of 1% lidocaine were injected. The patient tolerated the procedure well. Post procedure instructions were given.  2. Chronic low back pain. Lumbar postlaminectomy syndrome. This is a secondary complaint. At the current time, he can continue his Tylenol 3 with codeine 1 tablet 3 times a day but will need to check urine drug screen as well as sign opioid consent, controlled substance agreement

## 2016-07-10 NOTE — Patient Instructions (Addendum)
May need to repeat aspiration under ultrasound in 2 wks  Recommend Ibuprofen 200mg  2 tablets 3 time a day for 1 week

## 2016-07-16 LAB — 6-ACETYLMORPHINE,TOXASSURE ADD
6-ACETYLMORPHINE: NEGATIVE
6-ACETYLMORPHINE: NOT DETECTED ng/mg{creat}

## 2016-07-16 LAB — TOXASSURE SELECT,+ANTIDEPR,UR

## 2016-07-17 ENCOUNTER — Ambulatory Visit: Payer: Medicare Other | Admitting: Family Medicine

## 2016-07-24 ENCOUNTER — Ambulatory Visit: Payer: Medicare Other | Admitting: Physical Medicine & Rehabilitation

## 2016-07-24 ENCOUNTER — Encounter: Payer: Self-pay | Admitting: Physical Medicine & Rehabilitation

## 2016-07-24 ENCOUNTER — Ambulatory Visit: Payer: Medicare Other

## 2016-07-24 ENCOUNTER — Ambulatory Visit (HOSPITAL_BASED_OUTPATIENT_CLINIC_OR_DEPARTMENT_OTHER): Payer: Medicare Other | Admitting: Physical Medicine & Rehabilitation

## 2016-07-24 VITALS — BP 151/74 | HR 86

## 2016-07-24 DIAGNOSIS — M545 Low back pain: Secondary | ICD-10-CM

## 2016-07-24 DIAGNOSIS — Z87891 Personal history of nicotine dependence: Secondary | ICD-10-CM | POA: Diagnosis not present

## 2016-07-24 DIAGNOSIS — M961 Postlaminectomy syndrome, not elsewhere classified: Secondary | ICD-10-CM | POA: Diagnosis not present

## 2016-07-24 DIAGNOSIS — G8929 Other chronic pain: Secondary | ICD-10-CM | POA: Diagnosis not present

## 2016-07-24 DIAGNOSIS — G894 Chronic pain syndrome: Secondary | ICD-10-CM | POA: Diagnosis not present

## 2016-07-24 NOTE — Patient Instructions (Signed)
Please get your low back x-rays at the new [place] or office at [place]  May take ibuprofen 2 tablets per day with food  May take Tylenol 650 mg up to 4 times per day as needed

## 2016-07-24 NOTE — Progress Notes (Signed)
Mr. Ronald Strong returns today. He is a 83 year old male with chronic low back pain with lumbar spinal stenosis, but no evidence of radiculopathy or myelopathy. His right knee pain has subsided after right knee intra-articular injection performed 2 weeks ago He is very happy with his injection and is wondering about what type of back injections may be helpful. We discussed cities tried injections in the past, but never at this office. We discussed that his pain is mainly axial and does not appear to be radicular.  We also discussed his urine drug screen. The fact that did show alcohol and that I do not prescribe narcotic analgesics. The patient is using alcohol. He states that he likes to drink his glass of wine at night and that the Tylenol with codeine was really not that helpful anyway. He does feel like the ibuprofen is helpful, but we discussed not to take it more than once a day 400 mg with food He can take Tylenol 2 tablets up to 4 times a day of the regular strength, he does not think he will need to take this much  Examination he has tenderness to palpation along his lumbosacral junction. Some tenderness around the PSIS area. Right knee. No evidence of effusion. He has full range of motion, no pain along the medial or lateral joint line. No pain along the patella. Mood and affect are appropriate. Gait is forward flexed. He has no evidence to drag or knee instability   Impression 1. Right knee pain, improved after intra-articular injection. We can reinject in 3 months if needed. 2. Chronic low back pain, lumbar spinal stenosis, right-sided pain is greater left-sided pain. We'll set him up for right L3, L4 medial branch and L5 dorsal ramus injection under fluoroscopic guidance.

## 2016-08-07 ENCOUNTER — Ambulatory Visit: Payer: Medicare Other | Admitting: Family Medicine

## 2016-08-08 ENCOUNTER — Encounter: Payer: Self-pay | Admitting: Family Medicine

## 2016-08-08 ENCOUNTER — Ambulatory Visit (INDEPENDENT_AMBULATORY_CARE_PROVIDER_SITE_OTHER): Payer: Medicare Other | Admitting: Family Medicine

## 2016-08-08 VITALS — BP 94/52 | Temp 97.4°F | Ht 65.0 in | Wt 150.0 lb

## 2016-08-08 DIAGNOSIS — F321 Major depressive disorder, single episode, moderate: Secondary | ICD-10-CM | POA: Insufficient documentation

## 2016-08-08 DIAGNOSIS — M5416 Radiculopathy, lumbar region: Secondary | ICD-10-CM | POA: Diagnosis not present

## 2016-08-08 DIAGNOSIS — I10 Essential (primary) hypertension: Secondary | ICD-10-CM

## 2016-08-08 DIAGNOSIS — Z23 Encounter for immunization: Secondary | ICD-10-CM | POA: Diagnosis not present

## 2016-08-08 DIAGNOSIS — G8929 Other chronic pain: Secondary | ICD-10-CM

## 2016-08-08 MED ORDER — CITALOPRAM HYDROBROMIDE 10 MG PO TABS: 10.0000 mg | ORAL_TABLET | Freq: Every day | ORAL | 6 refills | Status: DC

## 2016-08-08 NOTE — Progress Notes (Signed)
Ronald Strong is a 83 year old recently widowed male who comes in today accompanied by his daughter [name]....... who he lives with....... for grief evaluation  Since his wife died a month ago he's been extremely depressed no energy no activities poor diet poor sleep. He's declined any grief counseling today  On Kalynn 120 mg his blood pressure today is 94/52. He says he is not lightheaded when he stands up  Other meds reviewed and are stable  Weight down 6 pounds since his wife died  BP (!) 94/52 (BP Location: Left Arm, Patient Position: Sitting, Cuff Size: Normal)   Temp 97.4 F (36.3 C) (Oral)   Ht 5\' 5"  (1.651 m)   Wt 150 lb (68 kg)   BMI 24.96 kg/m  In general he is well-developed well-nourished very bright male good memory with obvious grief reactions from the death of his wife  Hypertension......... BP too low 94/64........ decrease K Landa 1 Monday Wednesday Friday....... BP check daily at home....... follow-up in 4 weeks  #2 grief reaction.........Marland Kitchen begin hospice counseling....... trial of Celexa 10 mg daily at bedtime  #3 chronic low back pain....... followed by neurosurgery........ consider [employer] water aerobics program twice weekly

## 2016-08-08 NOTE — Progress Notes (Signed)
Pre visit review using our clinic review tool, if applicable. No additional management support is needed unless otherwise documented below in the visit note.  Pt agreed to charge if there is one for his tetanus injection.

## 2016-08-08 NOTE — Patient Instructions (Addendum)
Celexa 10 mg.................Marland Kitchen 1 tablet at bedtime  Initial session with hospice for you and your daughter  Calan 120 mg......... do not take an 8 tomorrow Thursday.........Marland Kitchen restart on Friday........ skip the weekend.......... then take 1 tablet Monday Wednesday Friday  Blood pressure check daily  at home daily,,,,,,,,, bring a record of all your blood pressure readings and the device to next visit  Follow-up in 4 weeks  Let us know tomorrow what pain medication he's taking from the neurosurgeon

## 2016-08-09 ENCOUNTER — Telehealth: Payer: Self-pay | Admitting: *Deleted

## 2016-08-09 NOTE — Telephone Encounter (Signed)
error 

## 2016-08-15 ENCOUNTER — Ambulatory Visit: Payer: Medicare Other | Admitting: Physical Medicine & Rehabilitation

## 2016-08-17 ENCOUNTER — Telehealth: Payer: Self-pay | Admitting: Registered Nurse

## 2016-08-17 NOTE — Telephone Encounter (Signed)
Ronald Strong  had a UDS on 07/10/2016 inconsistent, +ETOH. Dr. Letta Pate spoke with Ronald Strong.

## 2016-09-01 ENCOUNTER — Encounter: Payer: Medicare Other | Attending: Physical Medicine & Rehabilitation

## 2016-09-01 ENCOUNTER — Encounter: Payer: Self-pay | Admitting: Physical Medicine & Rehabilitation

## 2016-09-01 ENCOUNTER — Other Ambulatory Visit: Payer: Self-pay

## 2016-09-01 ENCOUNTER — Ambulatory Visit (HOSPITAL_BASED_OUTPATIENT_CLINIC_OR_DEPARTMENT_OTHER): Payer: Medicare Other | Admitting: Physical Medicine & Rehabilitation

## 2016-09-01 VITALS — BP 135/73 | HR 74

## 2016-09-01 DIAGNOSIS — Z87891 Personal history of nicotine dependence: Secondary | ICD-10-CM | POA: Diagnosis not present

## 2016-09-01 DIAGNOSIS — M545 Low back pain: Secondary | ICD-10-CM | POA: Diagnosis present

## 2016-09-01 DIAGNOSIS — G894 Chronic pain syndrome: Secondary | ICD-10-CM | POA: Diagnosis not present

## 2016-09-01 DIAGNOSIS — M961 Postlaminectomy syndrome, not elsewhere classified: Secondary | ICD-10-CM | POA: Diagnosis not present

## 2016-09-01 DIAGNOSIS — M47816 Spondylosis without myelopathy or radiculopathy, lumbar region: Secondary | ICD-10-CM

## 2016-09-01 NOTE — Progress Notes (Signed)
  PROCEDURE RECORD [facility]   Name: Ronald Strong DOB:1920-10-26 MRN: [id number]  Date:09/01/2016  Physician: Alysia Penna, MD    Nurse/CMA: Wessling CMA  Allergies:  Allergies  Allergen Reactions  . Bee Venom Anaphylaxis  . Epinephrine Anaphylaxis    severe anaphylaxis    Consent Signed: Yes.    Is patient diabetic? No.  CBG today? na  Pregnant: No. LMP: No LMP for male patient. (age 81-55)  Anticoagulants: no Anti-inflammatory: no Antibiotics: no  Procedure: Right L3-5 MBB        Position:Prone Start Time: 2:16pm End Time: 2:24pm  Fluoro Time: 27  RN/CMA Chaden Doom CMA Wessling CMA    Time 115pm 2:29pm    BP 135/73 152/71    Pulse 74 73    Respirations 16 14    O2 Sat 91 90    S/S 6 6    Pain Level 8/10 0/10     D/C home with , patient A & O X 3, D/C instructions reviewed, and sits independently.

## 2016-09-01 NOTE — Patient Instructions (Signed)

## 2016-09-11 ENCOUNTER — Encounter: Payer: Self-pay | Admitting: Family Medicine

## 2016-09-11 ENCOUNTER — Ambulatory Visit (INDEPENDENT_AMBULATORY_CARE_PROVIDER_SITE_OTHER): Payer: Medicare Other | Admitting: Family Medicine

## 2016-09-11 VITALS — BP 100/56 | Temp 98.5°F | Ht 65.0 in | Wt 143.0 lb

## 2016-09-11 DIAGNOSIS — F329 Major depressive disorder, single episode, unspecified: Secondary | ICD-10-CM

## 2016-09-11 DIAGNOSIS — I10 Essential (primary) hypertension: Secondary | ICD-10-CM

## 2016-09-11 MED ORDER — ZOLPIDEM TARTRATE 5 MG PO TABS: ORAL_TABLET | ORAL | 5 refills | Status: DC

## 2016-09-11 MED ORDER — CITALOPRAM HYDROBROMIDE 20 MG PO TABS: 20.0000 mg | ORAL_TABLET | Freq: Every day | ORAL | 3 refills | Status: DC

## 2016-09-11 MED ORDER — PREDNISONE 20 MG PO TABS: ORAL_TABLET | ORAL | 1 refills | Status: DC

## 2016-09-11 NOTE — Patient Instructions (Signed)
Increase the Celexa 20 mg daily  Stop the Maxide  Prednisone 20 mg..... Uses directed  OTC Prilosec ..........Marland Kitchen 1 tab twice daily  Stop the aspirin  Return in September sooner if any problems  Restart the hospice grief counseling program

## 2016-09-11 NOTE — Progress Notes (Signed)
Ronald Strong is a 83 year old recently widowed male who comes in today accompanied by his daughter Ronald Strong for follow-up of depression, hypertension, allergic rhinitis  He went to one hospice session and then go back. He's been Celexa 10 mg daily and Ambien 5 mg daily at bedtime for sleep. He says he sleeping R with a combination. His daughter is the Nurse, learning disability for [employer] and travels all over the world. When she is gone he doesn't eat.  He's had troubles allergic rhinitis this spring over-the-counter antihistamines haven't helped.  BP today is 100/56 on Kalynn 120 mg daily along with Maxide 25.  Weight down  He's having some midepigastric abdominal pain that comes and goes. No nausea vomiting diarrhea etc. etc.  BP (!) 100/56 (BP Location: Left Arm)   Temp 98.5 F (36.9 C) (Oral)   Ht 5\' 5"  (1.651 m)   Wt 143 lb (64.9 kg)   BMI 23.80 kg/m  Jones well-developed well-nourished male no acute distress cardiopulmonary exam normal  #1 depression......Marland Kitchen encouraged to restart the hospice counseling program and increase the Celexa 20 mg daily  Hypertension..... BP too low........... hold the Maxide.......Marland Kitchen   #3 allergic rhinitis....Marland KitchenMarland Kitchen prednisone burst and taper  #4 hyperacidity///////// stop the aspirin//////// Prilosec 20 mg twice a day for 4 weeks

## 2016-09-20 ENCOUNTER — Other Ambulatory Visit: Payer: Self-pay | Admitting: Family Medicine

## 2016-09-21 ENCOUNTER — Ambulatory Visit: Payer: Medicare Other | Admitting: Physical Medicine & Rehabilitation

## 2016-09-22 MED ORDER — ZOLPIDEM TARTRATE 5 MG PO TABS: ORAL_TABLET | ORAL | 1 refills | Status: DC

## 2016-09-22 NOTE — Telephone Encounter (Signed)
Called to the pharmacy and left on machine. 

## 2016-09-22 NOTE — Addendum Note (Signed)
Addended by: Miles Costain T on: 09/22/2016 04:19 PM   Modules accepted: Orders

## 2016-10-16 ENCOUNTER — Ambulatory Visit: Payer: Medicare Other | Admitting: Physical Medicine & Rehabilitation

## 2016-10-17 ENCOUNTER — Other Ambulatory Visit: Payer: Medicare Other

## 2016-10-17 DIAGNOSIS — E039 Hypothyroidism, unspecified: Secondary | ICD-10-CM

## 2016-10-17 LAB — TSH: TSH: 6.03 u[IU]/mL — ABNORMAL HIGH (ref 0.35–4.50)

## 2016-10-20 ENCOUNTER — Encounter: Payer: Self-pay | Admitting: Physical Medicine & Rehabilitation

## 2016-10-20 ENCOUNTER — Encounter: Payer: Medicare Other | Attending: Physical Medicine & Rehabilitation

## 2016-10-20 ENCOUNTER — Telehealth: Payer: Self-pay | Admitting: Family Medicine

## 2016-10-20 ENCOUNTER — Ambulatory Visit (HOSPITAL_BASED_OUTPATIENT_CLINIC_OR_DEPARTMENT_OTHER): Payer: Medicare Other | Admitting: Physical Medicine & Rehabilitation

## 2016-10-20 VITALS — BP 113/59 | HR 76

## 2016-10-20 DIAGNOSIS — G894 Chronic pain syndrome: Secondary | ICD-10-CM | POA: Diagnosis not present

## 2016-10-20 DIAGNOSIS — Z87891 Personal history of nicotine dependence: Secondary | ICD-10-CM | POA: Insufficient documentation

## 2016-10-20 DIAGNOSIS — M47816 Spondylosis without myelopathy or radiculopathy, lumbar region: Secondary | ICD-10-CM | POA: Diagnosis not present

## 2016-10-20 DIAGNOSIS — M961 Postlaminectomy syndrome, not elsewhere classified: Secondary | ICD-10-CM | POA: Insufficient documentation

## 2016-10-20 NOTE — Telephone Encounter (Signed)
° ° °  Pt called and I let hin know that Dr Sherren Mocha said everthing was ok just continue to on current medication

## 2016-10-20 NOTE — Patient Instructions (Signed)
We will repeat lumbar medial branch blocks using a different anesthetic agent next visit. If this also produces a short-term relief of greater than 50%, the next step would be radiofrequency neurotomy

## 2016-10-20 NOTE — Telephone Encounter (Signed)
Copied to lab note.

## 2016-10-20 NOTE — Progress Notes (Signed)
Subjective:    Patient ID: Ronald Strong, male    DOB: July 25, 1920, 83 y.o.   MRN: [id number]  HPI Third finger numbness , this is been going on for a month or so. He has not had any trauma to that area. He denies any wrist pain or any neck pain. Denies any numbness in the other fingers.  We reviewed the effects of his right L3, L4 medial branch and L5 dorsal ramus injection under fluoroscopic guidance performed approximately 1 month ago. Preinjection pain 8/10, postinjection pain 0/10  Injection did not help for more than an hour or 2 Pain Inventory Average Pain 9 Pain Right Now 9 My pain is no answer  In the last 24 hours, has pain interfered with the following? General activity 10 Relation with others 10 Enjoyment of life 10 What TIME of day is your pain at its worst? daytime evening and night Sleep (in general) Fair  Pain is worse with: walking, bending and standing Pain improves with: rest Relief from Meds: no answer  Mobility walk without assistance how many minutes can you walk? 6 ability to climb steps?  yes do you drive?  yes  Function retired  Neuro/Psych trouble walking  Prior Studies Any changes since last visit?  no  Physicians involved in your care Any changes since last visit?  no   Family History  Problem Relation Age of Onset  . Arthritis Other   . Heart disease Other   . Hypertension Other    Social History   Social History  . Marital status: Married    Spouse name: N/A  . Number of children: N/A  . Years of education: N/A   Social History Main Topics  . Smoking status: Former Smoker    Quit date: 06/04/1983  . Smokeless tobacco: Never Used  . Alcohol use Yes  . Drug use: No  . Sexual activity: Not Asked   Other Topics Concern  . None   Social History Narrative  . None   Past Surgical History:  Procedure Laterality Date  . OTHER SURGICAL HISTORY  09/01/10   spine decompression  . PROSTATE SURGERY  01/28/13   laser  . SPINE  SURGERY  09/11/10  . TOTAL HIP ARTHROPLASTY  09/21/06   right   Past Medical History:  Diagnosis Date  . Allergy   . Arrhythmia   . Arthritis   . Chicken pox   . Colon polyps   . Depression   . Diverticulitis   . Hypertension   . Thyroid disease    BP (!) 113/59   Pulse 76   SpO2 (!) 89%   Opioid Risk Score:   Fall Risk Score:  `1  Depression screen PHQ 2/9  Depression screen [facility] 2/9 10/20/2016 07/10/2016 05/11/2015 06/03/2013  Decreased Interest 0 0 0 0  Down, Depressed, Hopeless 0 0 0 0  PHQ - 2 Score 0 0 0 0    Review of Systems  Constitutional: Negative.   HENT: Negative.   Eyes: Negative.   Respiratory: Negative.   Cardiovascular: Negative.   Gastrointestinal: Negative.   Endocrine: Negative.   Genitourinary: Negative.   Musculoskeletal: Positive for gait problem.  Skin: Negative.   Allergic/Immunologic: Negative.   Hematological: Negative.   Psychiatric/Behavioral: Negative.   All other systems reviewed and are negative.      Objective:   Physical Exam  Constitutional: He is oriented to person, place, and time. He appears well-developed and well-nourished.  HENT:  Head: Normocephalic and  atraumatic.  Eyes: Conjunctivae and EOM are normal. Pupils are equal, round, and reactive to light.  Neck:  Neck care is 50% range with flexion, extension, lateral bending and rotation. No radiation of pain with right lateral bending  Musculoskeletal:       Right wrist: He exhibits normal range of motion, no tenderness, no bony tenderness and no deformity.       Cervical back: He exhibits decreased range of motion.       Thoracic back: He exhibits decreased range of motion and deformity.       Lumbar back: He exhibits decreased range of motion.       Right hand: He exhibits normal range of motion, no tenderness and no deformity. Normal sensation noted. Normal strength noted.  Thoracic kyphosis. Prominent L1 spinous process  Negative reverse Phalen's. Negative Tinel's  over the right wrist  Neurological: He is alert and oriented to person, place, and time. No sensory deficit.  Reflex Scores:      Tricep reflexes are 1+ on the right side and 1+ on the left side.      Bicep reflexes are 1+ on the right side and 1+ on the left side.      Brachioradialis reflexes are 1+ on the right side and 1+ on the left side.      Patellar reflexes are 1+ on the right side and 1+ on the left side.      Achilles reflexes are 0 on the right side and 0 on the left side. Patient ambulates without assistive device Motor strength is 5/5 bilateral hip flexor, knee extensor, ankle dorsiflexor, as well as deltoid, biceps, triceps, grip  Sensation intact to pinprick and light touch bilateral C5, C6-C7, C8,  L2, L3, L4, L5-S1 dermatomal distribution.  Psychiatric: He has a normal mood and affect.  Nursing note and vitals reviewed.         Assessment & Plan:  1. Lumbar spondylosis without evidence of myelopathy. He had anesthetic phase response to right L3-L4 as well as right L5 dorsal ramus injection under fluoroscopic guidance. We discussed the significance of this finding and the recommendation to repeat this with a longer lasting anesthetic. Looking for increased duration response. If patient gets at least 50% relief once again as well as longer duration. He would be a good candidate for radiofrequency neurotomy of right L3, L4 medial branches and right L5 dorsal ramus.  2. Right middle finger numbness exam is negative. We discussed that most likely etiology would be carpal tunnel, although his physical exam is negative. At this point, he does not feel it is very severe and would hold off on any further workup such as EMG/NCV.

## 2016-12-01 ENCOUNTER — Encounter: Payer: Self-pay | Admitting: Physical Medicine & Rehabilitation

## 2016-12-01 ENCOUNTER — Ambulatory Visit (HOSPITAL_BASED_OUTPATIENT_CLINIC_OR_DEPARTMENT_OTHER): Payer: Medicare Other | Admitting: Physical Medicine & Rehabilitation

## 2016-12-01 ENCOUNTER — Encounter: Payer: Medicare Other | Attending: Physical Medicine & Rehabilitation

## 2016-12-01 VITALS — BP 124/75 | HR 79 | Resp 14

## 2016-12-01 DIAGNOSIS — Z87891 Personal history of nicotine dependence: Secondary | ICD-10-CM | POA: Diagnosis not present

## 2016-12-01 DIAGNOSIS — M961 Postlaminectomy syndrome, not elsewhere classified: Secondary | ICD-10-CM | POA: Insufficient documentation

## 2016-12-01 DIAGNOSIS — M47816 Spondylosis without myelopathy or radiculopathy, lumbar region: Secondary | ICD-10-CM

## 2016-12-01 DIAGNOSIS — G894 Chronic pain syndrome: Secondary | ICD-10-CM | POA: Insufficient documentation

## 2016-12-01 NOTE — Progress Notes (Signed)
Right lumbar L3, L4 medial branch blocks and L5 dorsal ramus injection under fluoroscopic guidance  Indication: Right Lumbar pain which is not relieved by medication management or other conservative care and interfering with self-care and mobility.  Informed consent was obtained after describing risks and benefits of the procedure with the patient, this includes bleeding, bruising, infection, paralysis and medication side effects. The patient wishes to proceed and has given written consent. The patient was placed in a prone position. The lumbar area was marked and prepped with Betadine. One ML of 1% lidocaine was injected into each of 3 areas into the skin and subcutaneous tissue. Then a 22-gauge 3.5inch spinal needle was inserted targeting the junction of the Right S1 superior articular process and sacral ala junction. Needle was advanced under fluoroscopic guidance. Bone contact was made.Isovue 200 was injected x0.5 mL demonstrating no intravascular uptake. Then a solution containing .5% MPF marcaine was injected x0.5 mL. Then the Right L5 superior articular process in transverse process junction was targeted. Bone contact was made.Isovue 200 was injected x0.5 mL demonstrating no intravascular uptake. Then a solution containing .5% MPF marcaine was injected x0.5 mL. Then the Right L4 superior articular process in transverse process junction was targeted. Bone contact was made. Isovue 200 was injected x0.5 mL demonstrating no intravascular uptake. Then a solution containing .5% MPF marcaine was injected x0.5 mL Patient tolerated procedure well. Post procedure instructions were given. Please refer to post procedure form.

## 2016-12-01 NOTE — Progress Notes (Signed)
  PROCEDURE RECORD [name]   Name: Ronald Strong DOB:[birth year] MRN: [id number]  Date:12/01/2016  Physician: Alysia Penna, MD    Nurse/CMA: Lord Lancour, CMA   Allergies:  Allergies  Allergen Reactions  . Bee Venom Anaphylaxis  . Epinephrine Anaphylaxis    severe anaphylaxis  . Pollen Extract     Consent Signed: Yes.    Is patient diabetic? No.  CBG today?   Pregnant: No. LMP: No LMP for male patient. (age 80-55)  Anticoagulants: no Anti-inflammatory: no Antibiotics: no  Procedure: right medial branch block Position: Prone Start Time: 1:27pm  End Time: 1:38pm  Fluoro Time: 32  RN/CMA Manuela Halbur, CMA Aamani Moose, CMA    Time 1:10pm 1:43pm    BP 124/75 112/64    Pulse 79 79    Respirations 14 14    O2 Sat 92 92    S/S 6 6    Pain Level 8/10 0/10     D/C home with daughter, patient A & O X 3, D/C instructions reviewed, and sits independently.

## 2016-12-01 NOTE — Patient Instructions (Signed)

## 2016-12-11 ENCOUNTER — Inpatient Hospital Stay (HOSPITAL_COMMUNITY)
Admission: EM | Admit: 2016-12-11 | Discharge: 2016-12-13 | DRG: 183 | Disposition: A | Discharge status: Home Health | Payer: Medicare Other | Attending: Internal Medicine | Admitting: Internal Medicine

## 2016-12-11 ENCOUNTER — Emergency Department (HOSPITAL_COMMUNITY): Payer: Medicare Other

## 2016-12-11 ENCOUNTER — Encounter (HOSPITAL_COMMUNITY): Payer: Self-pay | Admitting: Emergency Medicine

## 2016-12-11 DIAGNOSIS — S2249XA Multiple fractures of ribs, unspecified side, initial encounter for closed fracture: Secondary | ICD-10-CM

## 2016-12-11 DIAGNOSIS — Z87891 Personal history of nicotine dependence: Secondary | ICD-10-CM

## 2016-12-11 DIAGNOSIS — E039 Hypothyroidism, unspecified: Secondary | ICD-10-CM | POA: Diagnosis not present

## 2016-12-11 DIAGNOSIS — N183 Chronic kidney disease, stage 3 (moderate): Secondary | ICD-10-CM | POA: Diagnosis not present

## 2016-12-11 DIAGNOSIS — J449 Chronic obstructive pulmonary disease, unspecified: Secondary | ICD-10-CM

## 2016-12-11 DIAGNOSIS — S2243XA Multiple fractures of ribs, bilateral, initial encounter for closed fracture: Principal | ICD-10-CM | POA: Diagnosis present

## 2016-12-11 DIAGNOSIS — I129 Hypertensive chronic kidney disease with stage 1 through stage 4 chronic kidney disease, or unspecified chronic kidney disease: Secondary | ICD-10-CM | POA: Diagnosis present

## 2016-12-11 DIAGNOSIS — Z96641 Presence of right artificial hip joint: Secondary | ICD-10-CM | POA: Diagnosis present

## 2016-12-11 DIAGNOSIS — Y92002 Bathroom of unspecified non-institutional (private) residence single-family (private) house as the place of occurrence of the external cause: Secondary | ICD-10-CM

## 2016-12-11 DIAGNOSIS — Z7982 Long term (current) use of aspirin: Secondary | ICD-10-CM | POA: Diagnosis not present

## 2016-12-11 DIAGNOSIS — M546 Pain in thoracic spine: Secondary | ICD-10-CM | POA: Diagnosis not present

## 2016-12-11 DIAGNOSIS — F32A Depression, unspecified: Secondary | ICD-10-CM | POA: Diagnosis present

## 2016-12-11 DIAGNOSIS — T148XXA Other injury of unspecified body region, initial encounter: Secondary | ICD-10-CM | POA: Diagnosis not present

## 2016-12-11 DIAGNOSIS — Z8249 Family history of ischemic heart disease and other diseases of the circulatory system: Secondary | ICD-10-CM

## 2016-12-11 DIAGNOSIS — Z66 Do not resuscitate: Secondary | ICD-10-CM | POA: Diagnosis present

## 2016-12-11 DIAGNOSIS — R0781 Pleurodynia: Secondary | ICD-10-CM | POA: Diagnosis not present

## 2016-12-11 DIAGNOSIS — S2242XA Multiple fractures of ribs, left side, initial encounter for closed fracture: Secondary | ICD-10-CM

## 2016-12-11 DIAGNOSIS — J9621 Acute and chronic respiratory failure with hypoxia: Secondary | ICD-10-CM

## 2016-12-11 DIAGNOSIS — S2239XA Fracture of one rib, unspecified side, initial encounter for closed fracture: Secondary | ICD-10-CM | POA: Diagnosis present

## 2016-12-11 DIAGNOSIS — Z79899 Other long term (current) drug therapy: Secondary | ICD-10-CM

## 2016-12-11 DIAGNOSIS — S20212A Contusion of left front wall of thorax, initial encounter: Secondary | ICD-10-CM | POA: Diagnosis not present

## 2016-12-11 DIAGNOSIS — N179 Acute kidney failure, unspecified: Secondary | ICD-10-CM | POA: Diagnosis not present

## 2016-12-11 DIAGNOSIS — F329 Major depressive disorder, single episode, unspecified: Secondary | ICD-10-CM | POA: Diagnosis present

## 2016-12-11 DIAGNOSIS — W1830XA Fall on same level, unspecified, initial encounter: Secondary | ICD-10-CM | POA: Diagnosis present

## 2016-12-11 DIAGNOSIS — W19XXXA Unspecified fall, initial encounter: Secondary | ICD-10-CM

## 2016-12-11 DIAGNOSIS — S299XXA Unspecified injury of thorax, initial encounter: Secondary | ICD-10-CM | POA: Diagnosis not present

## 2016-12-11 DIAGNOSIS — Z91048 Other nonmedicinal substance allergy status: Secondary | ICD-10-CM

## 2016-12-11 DIAGNOSIS — I10 Essential (primary) hypertension: Secondary | ICD-10-CM | POA: Diagnosis not present

## 2016-12-11 DIAGNOSIS — S0990XA Unspecified injury of head, initial encounter: Secondary | ICD-10-CM | POA: Diagnosis not present

## 2016-12-11 DIAGNOSIS — Z888 Allergy status to other drugs, medicaments and biological substances status: Secondary | ICD-10-CM

## 2016-12-11 DIAGNOSIS — Z91030 Bee allergy status: Secondary | ICD-10-CM

## 2016-12-11 LAB — CBC WITH DIFFERENTIAL/PLATELET
Basophils Absolute: 0 10*3/uL (ref 0.0–0.1)
Basophils Relative: 0 %
EOS ABS: 0.4 10*3/uL (ref 0.0–0.7)
Eosinophils Relative: 3 %
HEMATOCRIT: 39.3 % (ref 39.0–52.0)
Hemoglobin: 13.2 g/dL (ref 13.0–17.0)
LYMPHS PCT: 10 %
Lymphs Abs: 1.1 10*3/uL (ref 0.7–4.0)
MCH: 31.8 pg (ref 26.0–34.0)
MCHC: 33.6 g/dL (ref 30.0–36.0)
MCV: 94.7 fL (ref 78.0–100.0)
MONOS PCT: 11 %
Monocytes Absolute: 1.3 10*3/uL — ABNORMAL HIGH (ref 0.1–1.0)
NEUTROS ABS: 8.6 10*3/uL — AB (ref 1.7–7.7)
Neutrophils Relative %: 76 %
Platelets: 190 10*3/uL (ref 150–400)
RBC: 4.15 MIL/uL — AB (ref 4.22–5.81)
RDW: 13.3 % (ref 11.5–15.5)
WBC: 11.3 10*3/uL — AB (ref 4.0–10.5)

## 2016-12-11 LAB — COMPREHENSIVE METABOLIC PANEL
ALT: 13 U/L — AB (ref 17–63)
ANION GAP: 7 (ref 5–15)
AST: 19 U/L (ref 15–41)
Albumin: 3.8 g/dL (ref 3.5–5.0)
Alkaline Phosphatase: 54 U/L (ref 38–126)
BUN: 34 mg/dL — ABNORMAL HIGH (ref 6–20)
CO2: 28 mmol/L (ref 22–32)
CREATININE: 1.47 mg/dL — AB (ref 0.61–1.24)
Calcium: 9.3 mg/dL (ref 8.9–10.3)
Chloride: 105 mmol/L (ref 101–111)
GFR, EST AFRICAN AMERICAN: 46 mL/min — AB (ref >60)
GFR, EST NON AFRICAN AMERICAN: 39 mL/min — AB (ref >60)
Glucose, Bld: 103 mg/dL — ABNORMAL HIGH (ref 65–99)
POTASSIUM: 4.2 mmol/L (ref 3.5–5.1)
SODIUM: 140 mmol/L (ref 135–145)
Total Bilirubin: 0.4 mg/dL (ref 0.3–1.2)
Total Protein: 6.3 g/dL — ABNORMAL LOW (ref 6.5–8.1)

## 2016-12-11 MED ORDER — FENTANYL CITRATE (PF) 100 MCG/2ML IJ SOLN
50.0000 ug | Freq: Once | INTRAMUSCULAR | Status: AC
  Administered 2016-12-11: 50 ug via INTRAVENOUS
  Filled 2016-12-11: qty 2

## 2016-12-11 MED ORDER — OXYCODONE HCL 5 MG PO TABS
5.0000 mg | ORAL_TABLET | ORAL | Status: DC | PRN
  Administered 2016-12-11 – 2016-12-12 (×2): 5 mg via ORAL
  Filled 2016-12-11 (×2): qty 1

## 2016-12-11 MED ORDER — HYDROMORPHONE HCL-NACL 0.5-0.9 MG/ML-% IV SOSY
0.5000 mg | PREFILLED_SYRINGE | INTRAVENOUS | Status: DC | PRN
  Administered 2016-12-11 – 2016-12-12 (×2): 0.5 mg via INTRAVENOUS
  Filled 2016-12-11: qty 1

## 2016-12-11 MED ORDER — SODIUM CHLORIDE 0.9 % IV SOLN
INTRAVENOUS | Status: AC
  Administered 2016-12-11: 11:00:00 via INTRAVENOUS

## 2016-12-11 MED ORDER — KETOROLAC TROMETHAMINE 15 MG/ML IJ SOLN
10.0000 mg | Freq: Once | INTRAMUSCULAR | Status: AC
  Administered 2016-12-11: 10 mg via INTRAVENOUS
  Filled 2016-12-11: qty 1

## 2016-12-11 MED ORDER — TAMSULOSIN HCL 0.4 MG PO CAPS
0.4000 mg | ORAL_CAPSULE | Freq: Every morning | ORAL | Status: DC
  Administered 2016-12-11 – 2016-12-13 (×3): 0.4 mg via ORAL
  Filled 2016-12-11 (×3): qty 1

## 2016-12-11 MED ORDER — ASPIRIN EC 325 MG PO TBEC
325.0000 mg | DELAYED_RELEASE_TABLET | Freq: Every day | ORAL | Status: DC
  Administered 2016-12-11 – 2016-12-13 (×3): 325 mg via ORAL
  Filled 2016-12-11 (×3): qty 1

## 2016-12-11 MED ORDER — ACETAMINOPHEN 650 MG RE SUPP: 650.0000 mg | Freq: Four times a day (QID) | RECTAL | Status: DC | PRN

## 2016-12-11 MED ORDER — CITALOPRAM HYDROBROMIDE 20 MG PO TABS
20.0000 mg | ORAL_TABLET | Freq: Every day | ORAL | Status: DC
  Administered 2016-12-11 – 2016-12-13 (×3): 20 mg via ORAL
  Filled 2016-12-11 (×3): qty 1

## 2016-12-11 MED ORDER — ACETAMINOPHEN 325 MG PO TABS: 650.0000 mg | ORAL_TABLET | Freq: Four times a day (QID) | ORAL | Status: DC | PRN

## 2016-12-11 MED ORDER — ENOXAPARIN SODIUM 30 MG/0.3ML ~~LOC~~ SOLN
30.0000 mg | Freq: Every day | SUBCUTANEOUS | Status: DC
  Administered 2016-12-11 – 2016-12-13 (×3): 30 mg via SUBCUTANEOUS
  Filled 2016-12-11 (×3): qty 0.3

## 2016-12-11 MED ORDER — ONDANSETRON HCL 4 MG/2ML IJ SOLN: 4.0000 mg | Freq: Four times a day (QID) | INTRAMUSCULAR | Status: DC | PRN

## 2016-12-11 MED ORDER — ADULT MULTIVITAMIN W/MINERALS CH
1.0000 | ORAL_TABLET | Freq: Every day | ORAL | Status: DC
  Administered 2016-12-11 – 2016-12-13 (×3): 1 via ORAL
  Filled 2016-12-11 (×3): qty 1

## 2016-12-11 MED ORDER — HYDROMORPHONE HCL-NACL 0.5-0.9 MG/ML-% IV SOSY
0.5000 mg | PREFILLED_SYRINGE | Freq: Once | INTRAVENOUS | Status: DC
  Filled 2016-12-11: qty 1

## 2016-12-11 MED ORDER — ONDANSETRON HCL 4 MG PO TABS: 4.0000 mg | ORAL_TABLET | Freq: Four times a day (QID) | ORAL | Status: DC | PRN

## 2016-12-11 MED ORDER — LEVOTHYROXINE SODIUM 25 MCG PO TABS
137.0000 ug | ORAL_TABLET | Freq: Every day | ORAL | Status: DC
  Administered 2016-12-12 – 2016-12-13 (×2): 137 ug via ORAL
  Filled 2016-12-11 (×2): qty 1

## 2016-12-11 MED ORDER — DOCUSATE SODIUM 100 MG PO CAPS
100.0000 mg | ORAL_CAPSULE | Freq: Two times a day (BID) | ORAL | Status: DC
  Administered 2016-12-11 – 2016-12-13 (×4): 100 mg via ORAL
  Filled 2016-12-11 (×5): qty 1

## 2016-12-11 MED ORDER — TRAMADOL HCL 50 MG PO TABS
50.0000 mg | ORAL_TABLET | Freq: Three times a day (TID) | ORAL | Status: DC
  Administered 2016-12-11 – 2016-12-13 (×7): 50 mg via ORAL
  Filled 2016-12-11 (×8): qty 1

## 2016-12-11 MED ORDER — POLYETHYLENE GLYCOL 3350 17 G PO PACK
17.0000 g | PACK | Freq: Every day | ORAL | Status: DC
  Administered 2016-12-12 – 2016-12-13 (×2): 17 g via ORAL
  Filled 2016-12-11 (×2): qty 1

## 2016-12-11 NOTE — ED Notes (Signed)
C-collar and spinal brace removed by Dr. Kathrynn Humble

## 2016-12-11 NOTE — ED Provider Notes (Addendum)
[facility] DEPT Provider Note   CSN: [id number] Arrival date & time: 12/11/16  0316     History   Chief Complaint Chief Complaint  Patient presents with  . Fall    HPI Ronald Strong is a 83 y.o. male.  HPI Pt comes in to the ER with cc of pain. Pt has hx of HTN. He reports that he had a fall in the bathroom while he was urinating. Pt fell onto the tub and is having L sided chest pain. Pt also hit his head and has a headache. PT has no neck pain. Pt is not on blood thinners.  Past Medical History:  Diagnosis Date  . Allergy   . Arrhythmia   . Arthritis   . Chicken pox   . Colon polyps   . Depression   . Diverticulitis   . Hypertension   . Thyroid disease     Patient Active Problem List   Diagnosis Date Noted  . Depression, major, single episode, moderate ([place]) 08/08/2016  . [place] 10/05/2015  . Depression 03/29/2015  . Postlaminectomy syndrome, lumbar region 01/09/2014  . Other secondary scoliosis, thoracolumbar region 01/09/2014  . Viral URI with cough 12/16/2013  . Rapid heart rate 08/05/2013  . Chronic radicular low back pain 06/03/2013  . Hypothyroidism 06/03/2013  . Accelerated hypertension 06/03/2013  . Insomnia 06/03/2013  . BPH without urinary obstruction 06/03/2013    Past Surgical History:  Procedure Laterality Date  . OTHER SURGICAL HISTORY  09/01/10   spine decompression  . PROSTATE SURGERY  01/28/13   laser  . SPINE SURGERY  09/11/10  . TOTAL HIP ARTHROPLASTY  09/21/06   right       Home Medications    Prior to Admission medications   Medication Sig Start Date End Date Taking? Authorizing Provider  aspirin 325 MG EC tablet Take 325 mg by mouth 2 (two) times daily.    [provider]  citalopram (CELEXA) 20 MG tablet Take 1 tablet (20 mg total) by mouth daily. 09/11/16   Dorena Cookey, MD  Multiple Vitamin (MULTI VITAMIN DAILY PO) Take by mouth.    [provider]  SYNTHROID 137 MCG tablet TAKE 1 TABLET (137 MCG  TOTAL) BY MOUTH DAILY BEFORE BREAKFAST. 06/29/16   Burchette, Alinda Sierras, MD  tamsulosin (FLOMAX) 0.4 MG CAPS capsule TAKE 1 CAPSULE (0.4 MG TOTAL) BY MOUTH EVERY MORNING. 06/05/16   Dorena Cookey, MD  verapamil (CALAN) 120 MG tablet TAKE 1 TABLET (120 MG TOTAL) BY MOUTH EVERY MORNING. 06/05/16   Dorena Cookey, MD  zolpidem Lorrin Mais) 5 MG tablet 1 tab daily at bedtime 09/22/16   Dorena Cookey, MD    Family History Family History  Problem Relation Age of Onset  . Arthritis Other   . Heart disease Other   . Hypertension Other     Social History Social History  Substance Use Topics  . Smoking status: Former Smoker    Quit date: 06/04/1983  . Smokeless tobacco: Never Used  . Alcohol use Yes     Allergies   Bee venom; Epinephrine; and Pollen extract   Review of Systems Review of Systems  Respiratory: Negative for shortness of breath.   Cardiovascular: Positive for chest pain.  Musculoskeletal: Positive for gait problem and myalgias.  Hematological: Does not bruise/bleed easily.  All other systems reviewed and are negative.    Physical Exam Updated Vital Signs BP 115/72   Pulse 90   Temp 98.2 F (36.8 C) (Oral)  Resp 18   Ht 5\' 8"  (1.727 m)   Wt 63.5 kg (140 lb)   SpO2 93%   BMI 21.29 kg/m   Physical Exam  Constitutional: He is oriented to person, place, and time. He appears well-developed.  HENT:  Head: Atraumatic.  Neck: Neck supple.  Cardiovascular: Normal rate.   Pulmonary/Chest: Effort normal. He exhibits tenderness.  L sided lateral chest wall tenderness. Mild bruising  Abdominal: Soft. There is no tenderness. There is no guarding.  Musculoskeletal:  Head to toe evaluation shows no hematoma, bleeding of the scalp, no facial abrasions, no spine step offs, crepitus of the chest or neck, no tenderness to palpation of the bilateral upper and lower extremities, no gross deformities, no chest tenderness, no pelvic pain.   Neurological: He is alert and oriented to  person, place, and time.  Skin: Skin is warm.  Nursing note and vitals reviewed.    ED Treatments / Results  Labs (all labs ordered are listed, but only abnormal results are displayed) Labs Reviewed  COMPREHENSIVE METABOLIC PANEL - Abnormal; Notable for the following:       Result Value   Glucose, Bld 103 (*)    BUN 34 (*)    Creatinine, Ser 1.47 (*)    Total Protein 6.3 (*)    ALT 13 (*)    GFR calc non Af Amer 39 (*)    GFR calc Af Amer 46 (*)    All other components within normal limits  CBC WITH DIFFERENTIAL/PLATELET - Abnormal; Notable for the following:    WBC 11.3 (*)    RBC 4.15 (*)    Neutro Abs 8.6 (*)    Monocytes Absolute 1.3 (*)    All other components within normal limits    EKG  EKG Interpretation None       Radiology Dg Ribs Unilateral W/chest Left  Result Date: 12/11/2016 CLINICAL DATA:  Status post fall backward, with left-sided rib pain. Initial encounter. EXAM: LEFT RIBS AND CHEST - 3+ VIEW COMPARISON:  None. FINDINGS: No displaced rib fractures are seen. The lungs are well-aerated and appear grossly clear. There is no evidence of focal opacification, pleural effusion or pneumothorax. The cardiomediastinal silhouette is within normal limits. No acute osseous abnormalities are seen. IMPRESSION: No displaced rib fracture seen. No acute cardiopulmonary process seen. Electronically Signed   By: Garald Balding M.D.   On: 12/11/2016 04:44   Ct Head Wo Contrast  Result Date: 12/11/2016 CLINICAL DATA:  Status post fall backwards, with concern for head injury. Initial encounter. EXAM: CT HEAD WITHOUT CONTRAST TECHNIQUE: Contiguous axial images were obtained from the base of the skull through the vertex without intravenous contrast. COMPARISON:  None. FINDINGS: Brain: No evidence of acute infarction, hemorrhage, hydrocephalus, extra-axial collection or mass lesion/mass effect. Prominence of the ventricles and sulci reflects moderately severe cortical volume loss.  Cerebellar atrophy is noted. Diffuse periventricular and subcortical matter change likely reflects small vessel ischemic microangiopathy. The brainstem and fourth ventricle are within normal limits. The basal ganglia are unremarkable in appearance. The cerebral hemispheres demonstrate grossly normal gray-white differentiation. No mass effect or midline shift is seen. Vascular: No hyperdense vessel or unexpected calcification. Skull: There is no evidence of fracture; visualized osseous structures are unremarkable in appearance. Sinuses/Orbits: The orbits are within normal limits. The paranasal sinuses and mastoid air cells are well-aerated. Other: No significant soft tissue abnormalities are seen. IMPRESSION: 1. No evidence of traumatic intracranial injury or fracture. 2. Moderately severe cortical volume loss and  diffuse small vessel ischemic microangiopathy. Electronically Signed   By: Garald Balding M.D.   On: 12/11/2016 04:43   Ct Chest Wo Contrast  Result Date: 12/11/2016 CLINICAL DATA:  Status post fall in bathroom. Hit back on tub. Left lateral chest pain and back pain. Initial encounter. EXAM: CT CHEST WITHOUT CONTRAST TECHNIQUE: Multidetector CT imaging of the chest was performed following the standard protocol without IV contrast. COMPARISON:  Chest and left rib radiographs performed earlier today at 4:15 a.m. FINDINGS: Cardiovascular: The ascending thoracic aorta measures 4.1 cm in AP dimension, within physiologic range for the patient's age. Scattered calcification is noted along the aortic arch and descending thoracic aorta. Scattered calcification is seen along the proximal great vessels. The heart remains normal in size. Diffuse coronary artery calcifications are seen. Mediastinum/Nodes: The mediastinum is otherwise unremarkable in appearance. No mediastinal lymphadenopathy is seen. No pericardial effusion is identified. The thyroid gland is diminutive and not well assessed. No axillary  lymphadenopathy is seen. Lungs/Pleura: Mild atelectasis or scarring is noted at the lung bases. Bilateral emphysema is noted. No pleural effusion or pneumothorax is seen. No masses are identified. Upper Abdomen: Scattered hypodensities within the liver are nonspecific but may reflect cysts, measuring up to 7.0 cm in size. The visualized portions of the spleen are unremarkable. The visualized portions of the gallbladder are within normal limits. The visualized portions of the pancreas, adrenal glands and kidneys are within normal limits. Nonspecific perinephric stranding is noted bilaterally. Scattered calcification is noted along the proximal abdominal aorta. Musculoskeletal: There are minimally displaced fractures of the left posterior ninth and tenth ribs. Degenerative change is noted at the left glenohumeral joint, and at the lower cervical spine. There is grade 1 anterolisthesis of C7 on T1. Anterior bridging osteophytes are noted along the midthoracic spine. Multilevel vacuum phenomenon is noted along the lower thoracic spine. The visualized musculature is unremarkable in appearance. IMPRESSION: 1. Minimally displaced fractures of the left posterior ninth and tenth ribs. 2. Mild atelectasis or scarring at the lung bases. Bilateral emphysema noted. No evidence of pneumothorax. 3. Diffuse coronary artery calcifications seen. 4. Scattered aortic atherosclerosis. 5. Scattered hypodensities within the liver are nonspecific but may reflect cysts, measuring up to 7.0 cm in size. 6. Degenerative change at the left glenohumeral joint, lower cervical spine and lower thoracic spine. Electronically Signed   By: Garald Balding M.D.   On: 12/11/2016 07:08    Procedures Procedures (including critical care time)  Medications Ordered in ED Medications  fentaNYL (SUBLIMAZE) injection 50 mcg (50 mcg Intravenous Given 12/11/16 0443)  fentaNYL (SUBLIMAZE) injection 50 mcg (50 mcg Intravenous Given 12/11/16 5035)  ketorolac  (TORADOL) 15 MG/ML injection 10 mg (10 mg Intravenous Given 12/11/16 4656)     Initial Impression / Assessment and Plan / ED Course  I have reviewed the triage vital signs and the nursing notes.  Pertinent labs & imaging results that were available during my care of the patient were reviewed by me and considered in my medical decision making (see chart for details).  Clinical Course as of Dec 12 739  Mon Dec 11, 2016  0703 Results from the ER workup discussed with the patient face to face and all questions answered to the best of my ability.  We had patient attempt to ambulate, and he was  unable to even get out of bed. We will gt CT chest w/o contrast . Family not comfortable taking pt home. Pain control for now along with cT. If Ct  is neg, might need admission for pain control. DG Ribs Unilateral W/Chest Left [AN]  9532 CT CONFIRMS rib fractures. Pt comfortable when supine, but he doesn't feel well when he moves or takes deep inspiration. High risk pt given the age - admit. CT Chest Wo Contrast [AN]    Clinical Course User Index [AN] Varney Biles, MD    Pt comes in after a fall. DDx includes: - Mechanical falls - ICH - Fractures - Contusions - Soft tissue injury  CXR ordered and CT head ordered.  Final Clinical Impressions(s) / ED Diagnoses   Final diagnoses:  Fall, initial encounter  Contusion of left chest wall, initial encounter  Closed fracture of multiple ribs of left side, initial encounter    New Prescriptions New Prescriptions   No medications on file     Varney Biles, MD 12/11/16 [place], [place], MD 12/11/16 [place], [place], MD 12/11/16 [phone]

## 2016-12-11 NOTE — Evaluation (Signed)
Physical Therapy Evaluation Patient Details Name: Ronald Strong MRN: [id number] DOB: 1920-10-06 Today's Date: 12/11/2016   History of Present Illness  83 yo male admtted after falling at home. Sustained L side rib fractures (9, 10).  Clinical Impression  On eval, pt was Min guard assist for mobility. He walked ~125 feet with a rollator. Pain rated 7/10 with activity. Audible wheezing noted during ambulation. O2 sat 87% on RA. Recommend HHPT f/u after discharge. Recommend daily ambulation with nursing supervision during hospital stay.     Follow Up Recommendations Home health PT;Supervision/Assistance - 24 hour    Equipment Recommendations  None recommended by PT    Recommendations for Other Services       Precautions / Restrictions Precautions Precautions: Fall Restrictions Weight Bearing Restrictions: No      Mobility  Bed Mobility Overal bed mobility: Needs Assistance Bed Mobility: Supine to Sit     Supine to sit: Min guard;HOB elevated     General bed mobility comments: Increased time.   Transfers Overall transfer level: Needs assistance Equipment used: 4-wheeled walker Transfers: Sit to/from Stand Sit to Stand: Min guard         General transfer comment: close guard for safety. VCs hand placement. Increased time.   Ambulation/Gait Ambulation/Gait assistance: Min guard Ambulation Distance (Feet): 125 Feet Assistive device: 4-wheeled walker Gait Pattern/deviations: Step-through pattern;Decreased stride length;Trunk flexed     General Gait Details: Significant kyphotic posture. Pt tends to keep rollator too far ahead. Close guard for safety. Audible Wheezing. O2 sat 87% on RA  Stairs            Wheelchair Mobility    Modified Rankin (Stroke Patients Only)       Balance Overall balance assessment: Needs assistance           Standing balance-Leahy Scale: Fair                               Pertinent Vitals/Pain Pain  Assessment: 0-10 Pain Score: 7  Pain Location: L side with movement Pain Descriptors / Indicators: Sharp Pain Intervention(s): Limited activity within patient's tolerance;Repositioned    Home Living Family/patient expects to be discharged to:: Private residence Living Arrangements: Children Available Help at Discharge: Family Type of Home: House Home Access: Stairs to enter Entrance Stairs-Rails: Right Entrance Stairs-Number of Steps: 8 Home Layout: Two level;Able to live on main level with bedroom/bathroom Home Equipment: Walker - 4 wheels      Prior Function Level of Independence: Independent with assistive device(s)         Comments: uses rollator outside     Hand Dominance        Extremity/Trunk Assessment   Upper Extremity Assessment Upper Extremity Assessment: Generalized weakness    Lower Extremity Assessment Lower Extremity Assessment: Generalized weakness    Cervical / Trunk Assessment Cervical / Trunk Assessment: Kyphotic  Communication      Cognition Arousal/Alertness: Awake/alert Behavior During Therapy: WFL for tasks assessed/performed Overall Cognitive Status: Within Functional Limits for tasks assessed                                        General Comments      Exercises     Assessment/Plan    PT Assessment Patient needs continued PT services  PT Problem List Decreased mobility;Pain;Decreased activity tolerance;Decreased balance  PT Treatment Interventions DME instruction;Gait training;Therapeutic activities;Therapeutic exercise;Patient/family education;Functional mobility training;Balance training    PT Goals (Current goals can be found in the Care Plan section)  Acute Rehab PT Goals Patient Stated Goal: less pain PT Goal Formulation: With patient Time For Goal Achievement: 12/25/16 Potential to Achieve Goals: Good    Frequency Min 3X/week   Barriers to discharge        Co-evaluation                AM-PAC PT "6 Clicks" Daily Activity  Outcome Measure Difficulty turning over in bed (including adjusting bedclothes, sheets and blankets)?: A Little Difficulty moving from lying on back to sitting on the side of the bed? : A Little Difficulty sitting down on and standing up from a chair with arms (e.g., wheelchair, bedside commode, etc,.)?: A Little Help needed moving to and from a bed to chair (including a wheelchair)?: A Little Help needed walking in hospital room?: A Little Help needed climbing 3-5 steps with a railing? : A Little 6 Click Score: 18    End of Session   Activity Tolerance: Patient limited by pain Patient left: in chair;with call bell/phone within reach   PT Visit Diagnosis: Pain;Difficulty in walking, not elsewhere classified (R26.2) Pain - Right/Left: Left Pain - part of body:  (trunk)    Time: 7579-7282 PT Time Calculation (min) (ACUTE ONLY): 23 min   Charges:   PT Evaluation $PT Eval Low Complexity: 1 Low     PT G Codes:   PT G-Codes **NOT FOR INPATIENT CLASS** Functional Assessment Tool Used: AM-PAC 6 Clicks Basic Mobility;Clinical judgement Functional Limitation: Mobility: Walking and moving around Mobility: Walking and Moving Around Current Status (S6015): At least 1 percent but less than 20 percent impaired, limited or restricted Mobility: Walking and Moving Around Goal Status [phone]): At least 1 percent but less than 20 percent impaired, limited or restricted      Weston Anna, MPT Pager: [phone]

## 2016-12-11 NOTE — H&P (Addendum)
[facility] History and Physical  Arnell Slivinski BTD:[id number] DOB: [birth year] DOA: 12/11/2016   PCP: Dorena Cookey, MD  Specialists: None  Chief Complaint: Fall. Pain in the left side  HPI: Ronald Strong is a 83 y.o. male with a past medical history of depression, hypothyroidism, hypertension, who is quite independent at baseline and continues to drive without any difficulties. He went to the bathroom early this morning to urinate and when he was pulling his pants up, he fell backwards. He absolutely denies any lightheadedness or dizziness. Denies any passing out episodes. No chest pain or shortness of breath. Denies any headaches. He does not report hitting his head. He experienced pain in the left upper back area in the rib cage. Patient was brought into the hospital. He denies any discomfort with urinating. No nausea, vomiting or diarrhea.   In the emergency department imaging study showed rib fractures. Patient found it extremely difficult to get out of the bed due to severe pain. Patient will need to be hospitalized for pain control and physical therapy.  Home Medications: Prior to Admission medications   Medication Sig Start Date End Date Taking? Authorizing Provider  aspirin 325 MG EC tablet Take 325 mg by mouth 2 (two) times daily.   Yes [provider]  citalopram (CELEXA) 20 MG tablet Take 1 tablet (20 mg total) by mouth daily. 09/11/16  Yes Dorena Cookey, MD  diphenhydrAMINE (BENADRYL) 25 mg capsule Take 25 mg by mouth daily.   Yes [provider]  Multiple Vitamin (MULTIVITAMIN WITH MINERALS) TABS tablet Take 1 tablet by mouth daily.   Yes [provider]  SYNTHROID 137 MCG tablet TAKE 1 TABLET (137 MCG TOTAL) BY MOUTH DAILY BEFORE BREAKFAST. 06/29/16  Yes Burchette, Alinda Sierras, MD  tamsulosin (FLOMAX) 0.4 MG CAPS capsule TAKE 1 CAPSULE (0.4 MG TOTAL) BY MOUTH EVERY MORNING. 06/05/16  Yes Dorena Cookey, MD  verapamil (CALAN) 120 MG tablet TAKE 1  TABLET (120 MG TOTAL) BY MOUTH EVERY MORNING. 06/05/16  Yes Dorena Cookey, MD  zolpidem (AMBIEN) 5 MG tablet 1 tab daily at bedtime Patient taking differently: Take 5 mg by mouth at bedtime.  09/22/16  Yes Dorena Cookey, MD    Allergies:  Allergies  Allergen Reactions  . Bee Venom Anaphylaxis  . Epinephrine Anaphylaxis    severe anaphylaxis  . Pollen Extract     sneezing    Past Medical History: Past Medical History:  Diagnosis Date  . Allergy   . Arrhythmia   . Arthritis   . Chicken pox   . Colon polyps   . Depression   . Diverticulitis   . Hypertension   . Thyroid disease     Past Surgical History:  Procedure Laterality Date  . OTHER SURGICAL HISTORY  09/01/10   spine decompression  . PROSTATE SURGERY  01/28/13   laser  . SPINE SURGERY  09/11/10  . TOTAL HIP ARTHROPLASTY  09/21/06   right    Social History: Patient lives with his daughter. Denies smoking. Drinks either one martini or 1 shot of whiskey on a daily basis. Has been doing this for many years. Denies excessive consumption. Independent with daily activities. Usually. Sometimes has to use a walker. Still drives.   Family History:  Family History  Problem Relation Age of Onset  . Arthritis Other   . Heart disease Other   . Hypertension Other      Review of Systems - History obtained from the patient General  ROS: positive for  - fatigue Psychological ROS: negative Ophthalmic ROS: negative ENT ROS: negative Allergy and Immunology ROS: negative Hematological and Lymphatic ROS: negative Endocrine ROS: negative Respiratory ROS: no cough, shortness of breath, or wheezing Cardiovascular ROS: as in hpi Gastrointestinal ROS: no abdominal pain, change in bowel habits, or black or bloody stools Genito-Urinary ROS: no dysuria, trouble voiding, or hematuria Musculoskeletal ROS: negative Neurological ROS: no TIA or stroke symptoms Dermatological ROS: negative  Physical Examination  Vitals:   12/11/16 0600  12/11/16 0630 12/11/16 0636 12/11/16 1019  BP: 115/72 117/63  126/64  Pulse: 90 73  69  Resp: 18   18  Temp:   98.2 F (36.8 C) (!) 97.5 F (36.4 C)  TempSrc:   Oral Oral  SpO2: 93% 96%  100%  Weight:      Height:        BP 126/64 (BP Location: Right Arm)   Pulse 69   Temp (!) 97.5 F (36.4 C) (Oral)   Resp 18   Ht 5\' 8"  (1.727 m)   Wt 63.5 kg (140 lb)   SpO2 100%   BMI 21.29 kg/m   General appearance: alert, cooperative, appears stated age and no distress Head: Normocephalic, without obvious abnormality, atraumatic Eyes: conjunctivae/corneas clear. PERRL, EOM's intact.  Throat: lips, mucosa, and tongue normal; teeth and gums normal Neck: no adenopathy, no carotid bruit, no JVD, supple, symmetrical, trachea midline and thyroid not enlarged, symmetric, no tenderness/mass/nodules Resp: Somewhat diminished air entry at the left base. No crackles. No wheezing or Rhonchi Cardio: regular rate and rhythm, S1, S2 normal, no murmur, click, rub or gallop Tender over ninth and 10th rib posteriorly on the left. No bruising noted. GI: soft, non-tender; bowel sounds normal; no masses,  no organomegaly Extremities: extremities normal, atraumatic, no cyanosis or edema Pulses: 2+ and symmetric Skin: Skin color, texture, turgor normal. No rashes or lesions Lymph nodes: Cervical, supraclavicular, and axillary nodes normal. Neurologic: Awake, alert. No focal neurological deficits    Labs on Admission: I have personally reviewed following labs and imaging studies  CBC:  Recent Labs Lab 12/11/16 0359  WBC 11.3*  NEUTROABS 8.6*  HGB 13.2  HCT 39.3  MCV 94.7  PLT 151   Basic Metabolic Panel:  Recent Labs Lab 12/11/16 0359  NA 140  K 4.2  CL 105  CO2 28  GLUCOSE 103*  BUN 34*  CREATININE 1.47*  CALCIUM 9.3   GFR: Estimated Creatinine Clearance: 28.2 mL/min (A) (by C-G formula based on SCr of 1.47 mg/dL (H)). Liver Function Tests:  Recent Labs Lab 12/11/16 0359  AST  19  ALT 13*  ALKPHOS 54  BILITOT 0.4  PROT 6.3*  ALBUMIN 3.8     Radiological Exams on Admission: Dg Ribs Unilateral W/chest Left  Result Date: 12/11/2016 CLINICAL DATA:  Status post fall backward, with left-sided rib pain. Initial encounter. EXAM: LEFT RIBS AND CHEST - 3+ VIEW COMPARISON:  None. FINDINGS: No displaced rib fractures are seen. The lungs are well-aerated and appear grossly clear. There is no evidence of focal opacification, pleural effusion or pneumothorax. The cardiomediastinal silhouette is within normal limits. No acute osseous abnormalities are seen. IMPRESSION: No displaced rib fracture seen. No acute cardiopulmonary process seen. Electronically Signed   By: Garald Balding M.D.   On: 12/11/2016 04:44   Ct Head Wo Contrast  Result Date: 12/11/2016 CLINICAL DATA:  Status post fall backwards, with concern for head injury. Initial encounter. EXAM: CT HEAD WITHOUT CONTRAST TECHNIQUE: Contiguous  axial images were obtained from the base of the skull through the vertex without intravenous contrast. COMPARISON:  None. FINDINGS: Brain: No evidence of acute infarction, hemorrhage, hydrocephalus, extra-axial collection or mass lesion/mass effect. Prominence of the ventricles and sulci reflects moderately severe cortical volume loss. Cerebellar atrophy is noted. Diffuse periventricular and subcortical matter change likely reflects small vessel ischemic microangiopathy. The brainstem and fourth ventricle are within normal limits. The basal ganglia are unremarkable in appearance. The cerebral hemispheres demonstrate grossly normal gray-white differentiation. No mass effect or midline shift is seen. Vascular: No hyperdense vessel or unexpected calcification. Skull: There is no evidence of fracture; visualized osseous structures are unremarkable in appearance. Sinuses/Orbits: The orbits are within normal limits. The paranasal sinuses and mastoid air cells are well-aerated. Other: No significant  soft tissue abnormalities are seen. IMPRESSION: 1. No evidence of traumatic intracranial injury or fracture. 2. Moderately severe cortical volume loss and diffuse small vessel ischemic microangiopathy. Electronically Signed   By: Garald Balding M.D.   On: 12/11/2016 04:43   Ct Chest Wo Contrast  Result Date: 12/11/2016 CLINICAL DATA:  Status post fall in bathroom. Hit back on tub. Left lateral chest pain and back pain. Initial encounter. EXAM: CT CHEST WITHOUT CONTRAST TECHNIQUE: Multidetector CT imaging of the chest was performed following the standard protocol without IV contrast. COMPARISON:  Chest and left rib radiographs performed earlier today at 4:15 a.m. FINDINGS: Cardiovascular: The ascending thoracic aorta measures 4.1 cm in AP dimension, within physiologic range for the patient's age. Scattered calcification is noted along the aortic arch and descending thoracic aorta. Scattered calcification is seen along the proximal great vessels. The heart remains normal in size. Diffuse coronary artery calcifications are seen. Mediastinum/Nodes: The mediastinum is otherwise unremarkable in appearance. No mediastinal lymphadenopathy is seen. No pericardial effusion is identified. The thyroid gland is diminutive and not well assessed. No axillary lymphadenopathy is seen. Lungs/Pleura: Mild atelectasis or scarring is noted at the lung bases. Bilateral emphysema is noted. No pleural effusion or pneumothorax is seen. No masses are identified. Upper Abdomen: Scattered hypodensities within the liver are nonspecific but may reflect cysts, measuring up to 7.0 cm in size. The visualized portions of the spleen are unremarkable. The visualized portions of the gallbladder are within normal limits. The visualized portions of the pancreas, adrenal glands and kidneys are within normal limits. Nonspecific perinephric stranding is noted bilaterally. Scattered calcification is noted along the proximal abdominal aorta.  Musculoskeletal: There are minimally displaced fractures of the left posterior ninth and tenth ribs. Degenerative change is noted at the left glenohumeral joint, and at the lower cervical spine. There is grade 1 anterolisthesis of C7 on T1. Anterior bridging osteophytes are noted along the midthoracic spine. Multilevel vacuum phenomenon is noted along the lower thoracic spine. The visualized musculature is unremarkable in appearance. IMPRESSION: 1. Minimally displaced fractures of the left posterior ninth and tenth ribs. 2. Mild atelectasis or scarring at the lung bases. Bilateral emphysema noted. No evidence of pneumothorax. 3. Diffuse coronary artery calcifications seen. 4. Scattered aortic atherosclerosis. 5. Scattered hypodensities within the liver are nonspecific but may reflect cysts, measuring up to 7.0 cm in size. 6. Degenerative change at the left glenohumeral joint, lower cervical spine and lower thoracic spine. Electronically Signed   By: Garald Balding M.D.   On: 12/11/2016 07:08     Problem List  Principal Problem:   Multiple closed fractures of ribs of left side Active Problems:   Hypothyroidism   Depression   Rib  fracture   Essential hypertension   Assessment: This is a 83 year old Caucasian male with a past medical history as stated earlier, who presents after what appears to be a mechanical fall at home resulting in rib fractures. Patient has significant pain, which impairs his ability to get out of bed and mobilize himself.  Plan: #1 Fall resulting in left 9th and 10th posterior rib fractures/hypoxia: Patient with significant pain even with minimal movement. He'll be placed on pain medications. He has tolerated tramadol and OxyIR in the past. Laxatives. Will discuss also with general surgery. PT and OT evaluation. Incentive spirometry. Pulmonary toilet. Hypoxia, likely secondary to splinting. Continue oxygen for now.  #2 history of hypothyroidism: Continue with his home  medications.  #3. depression: Continue home medications.  #4. History of essential hypertension: Blood pressure is reasonably well controlled. Continue to monitor  #5 the nonspecific findings on the CT including possible liver cysts can be further evaluated by his outpatient providers.  #6 chronic kidney disease, stage 3: Seems to be at baseline. Continue to monitor urine output.   DVT Prophylaxis: Lovenox Code Status: DO NOT RESUSCITATE Family Communication: No family at bedside  Consults called: Phone discussion with General surgery   Severity of Illness: The appropriate patient status for this patient is OBSERVATION. Observation status is judged to be reasonable and necessary in order to provide the required intensity of service to ensure the patient's safety. The patient's presenting symptoms, physical exam findings, and initial radiographic and laboratory data in the context of their medical condition is felt to place them at decreased risk for further clinical deterioration. Furthermore, it is anticipated that the patient will be medically stable for discharge from the hospital within 2 midnights of admission. The following factors support the patient status of observation.   " The patient's presenting symptoms include fall with left-sided back pain. " The physical exam findings include hypoxia. " The initial radiographic and laboratory data are positive for rib fractures.  Further management decisions will depend on results of further testing and patient's response to treatment.   [facility]  [facility] Pager [phone]  If 7PM-7AM, please contact night-coverage [email or web address] Password [email or web address]  12/11/2016, 1:20 PM

## 2016-12-11 NOTE — ED Triage Notes (Signed)
Pt presents to ED via [facility] to be evaluated for fall. Pt states he he lost balance while coming out the bathroom and fell backward. Pt also reports he took Ambien prior to fall. Spine brace and c-collar placed by EMS. Pt c/o back pain.

## 2016-12-11 NOTE — ED Notes (Signed)
Bed: KH99 Expected date:  Expected time:  Means of arrival:  Comments: 60m falling

## 2016-12-11 NOTE — Consult Note (Signed)
[facility] Surgery Consult/Admission Note  Ronald Strong [birth year]  [id number].    Requesting MD: Dr. [place] Pink Chief Complaint/Reason for Consult: fall, rib fractures  HPI:   Patient is a 83 year old male with a history of depression, hypothyroidism, HTN who lives with his daughter and is fairly independent who presented to the [facility] ED after a fall this morning. Patient states he was in the bathroom to urinate and was pulling up his pants when he fell backwards and hit the right side of his back on the tub. He denies hitting his head. He denies loss of conscious. He denies chest pain or SOB. He is complaining of constant pain in his right posterior rib area, worse and severe with certain movements and deep breaths, pain is nonradiating, pain medication helps slightly. No associated symptoms. CT of chest showed minimally displaced fractures of the left posterior ninth and 10th ribs. No pneumothorax. Patient was admitted to medicine we are asked to consult.  ROS:  Review of Systems  Constitutional: Negative for diaphoresis and fever.  Respiratory: Negative for shortness of breath.   Cardiovascular: Negative for chest pain.  Gastrointestinal: Negative for abdominal pain and vomiting.  Musculoskeletal: Positive for back pain and falls. Negative for neck pain.  Neurological: Negative for dizziness and loss of consciousness.  All other systems reviewed and are negative.    Family History  Problem Relation Age of Onset  . Arthritis Other   . Heart disease Other   . Hypertension Other     Past Medical History:  Diagnosis Date  . Allergy   . Arrhythmia   . Arthritis   . Chicken pox   . Colon polyps   . Depression   . Diverticulitis   . Hypertension   . Thyroid disease     Past Surgical History:  Procedure Laterality Date  . OTHER SURGICAL HISTORY  09/01/10   spine decompression  . PROSTATE SURGERY  01/28/13   laser  . SPINE SURGERY  09/11/10  . TOTAL HIP ARTHROPLASTY   09/21/06   right    Social History:  reports that he quit smoking about 33 years ago. He has never used smokeless tobacco. He reports that he drinks alcohol. He reports that he does not use drugs.  Allergies:  Allergies  Allergen Reactions  . Bee Venom Anaphylaxis  . Epinephrine Anaphylaxis    severe anaphylaxis  . Pollen Extract     sneezing    Medications Prior to Admission  Medication Sig Dispense Refill  . aspirin 325 MG EC tablet Take 325 mg by mouth 2 (two) times daily.    . citalopram (CELEXA) 20 MG tablet Take 1 tablet (20 mg total) by mouth daily. 30 tablet 3  . diphenhydrAMINE (BENADRYL) 25 mg capsule Take 25 mg by mouth daily.    . Multiple Vitamin (MULTIVITAMIN WITH MINERALS) TABS tablet Take 1 tablet by mouth daily.    Marland Kitchen SYNTHROID 137 MCG tablet TAKE 1 TABLET (137 MCG TOTAL) BY MOUTH DAILY BEFORE BREAKFAST. 90 tablet 1  . tamsulosin (FLOMAX) 0.4 MG CAPS capsule TAKE 1 CAPSULE (0.4 MG TOTAL) BY MOUTH EVERY MORNING. 100 capsule 2  . verapamil (CALAN) 120 MG tablet TAKE 1 TABLET (120 MG TOTAL) BY MOUTH EVERY MORNING. 100 tablet 2  . zolpidem (AMBIEN) 5 MG tablet 1 tab daily at bedtime (Patient taking differently: Take 5 mg by mouth at bedtime. ) 90 tablet 1    Blood pressure (!) 114/57, pulse 66, temperature 98.1 F (36.7 C), temperature source  Oral, resp. rate 20, height 5' 8" (1.727 m), weight 140 lb (63.5 kg), SpO2 99 %.  Physical Exam  Constitutional: He is oriented to person, place, and time and well-developed, well-nourished, and in no distress. No distress.  Well-appearing elderly white male  HENT:  Head: Normocephalic and atraumatic.  Nose: Nose normal.  Mouth/Throat: Oropharynx is clear and moist. No oropharyngeal exudate.  Eyes: Pupils are equal, round, and reactive to light. Conjunctivae are normal. Right eye exhibits no discharge. Left eye exhibits no discharge. No scleral icterus.  Neck: Normal range of motion. Neck supple. No tracheal deviation present.  No thyromegaly present.  Cardiovascular: Normal rate, regular rhythm and normal heart sounds.  Exam reveals no gallop and no friction rub.   No murmur heard. Pulses:      Radial pulses are 2+ on the right side, and 2+ on the left side.  Pulmonary/Chest: Effort normal and breath sounds normal. No respiratory distress. He has no wheezes. He has no rhonchi. He has no rales.  TTP to right posterior ribs  Abdominal: Soft. Bowel sounds are normal. He exhibits no distension. There is no tenderness. No hernia.  Musculoskeletal: Normal range of motion. He exhibits no edema, tenderness or deformity.  Lymphadenopathy:    He has no cervical adenopathy.  Neurological: He is alert and oriented to person, place, and time.  Skin: Skin is warm and dry. No rash noted. He is not diaphoretic.  Psychiatric: Mood and affect normal.  Nursing note and vitals reviewed.   Results for orders placed or performed during the hospital encounter of 12/11/16 (from the past 48 hour(s))  Comprehensive metabolic panel     Status: Abnormal   Collection Time: 12/11/16  3:59 AM  Result Value Ref Range   Sodium 140 135 - 145 mmol/L   Potassium 4.2 3.5 - 5.1 mmol/L   Chloride 105 101 - 111 mmol/L   CO2 28 22 - 32 mmol/L   Glucose, Bld 103 (H) 65 - 99 mg/dL   BUN 34 (H) 6 - 20 mg/dL   Creatinine, Ser 1.47 (H) 0.61 - 1.24 mg/dL   Calcium 9.3 8.9 - 10.3 mg/dL   Total Protein 6.3 (L) 6.5 - 8.1 g/dL   Albumin 3.8 3.5 - 5.0 g/dL   AST 19 15 - 41 U/L   ALT 13 (L) 17 - 63 U/L   Alkaline Phosphatase 54 38 - 126 U/L   Total Bilirubin 0.4 0.3 - 1.2 mg/dL   GFR calc non Af Amer 39 (L) >60 mL/min   GFR calc Af Amer 46 (L) >60 mL/min    Comment: (NOTE) The eGFR has been calculated using the CKD EPI equation. This calculation has not been validated in all clinical situations. eGFR's persistently <60 mL/min signify possible Chronic Kidney Disease.    Anion gap 7 5 - 15  CBC with Differential/Platelet     Status: Abnormal    Collection Time: 12/11/16  3:59 AM  Result Value Ref Range   WBC 11.3 (H) 4.0 - 10.5 K/uL   RBC 4.15 (L) 4.22 - 5.81 MIL/uL   Hemoglobin 13.2 13.0 - 17.0 g/dL   HCT 39.3 39.0 - 52.0 %   MCV 94.7 78.0 - 100.0 fL   MCH 31.8 26.0 - 34.0 pg   MCHC 33.6 30.0 - 36.0 g/dL   RDW 13.3 11.5 - 15.5 %   Platelets 190 150 - 400 K/uL   Neutrophils Relative % 76 %   Neutro Abs 8.6 (H) 1.7 - 7.7  K/uL   Lymphocytes Relative 10 %   Lymphs Abs 1.1 0.7 - 4.0 K/uL   Monocytes Relative 11 %   Monocytes Absolute 1.3 (H) 0.1 - 1.0 K/uL   Eosinophils Relative 3 %   Eosinophils Absolute 0.4 0.0 - 0.7 K/uL   Basophils Relative 0 %   Basophils Absolute 0.0 0.0 - 0.1 K/uL   Dg Ribs Unilateral W/chest Left  Result Date: 12/11/2016 CLINICAL DATA:  Status post fall backward, with left-sided rib pain. Initial encounter. EXAM: LEFT RIBS AND CHEST - 3+ VIEW COMPARISON:  None. FINDINGS: No displaced rib fractures are seen. The lungs are well-aerated and appear grossly clear. There is no evidence of focal opacification, pleural effusion or pneumothorax. The cardiomediastinal silhouette is within normal limits. No acute osseous abnormalities are seen. IMPRESSION: No displaced rib fracture seen. No acute cardiopulmonary process seen. Electronically Signed   By: Garald Balding M.D.   On: 12/11/2016 04:44   Ct Head Wo Contrast  Result Date: 12/11/2016 CLINICAL DATA:  Status post fall backwards, with concern for head injury. Initial encounter. EXAM: CT HEAD WITHOUT CONTRAST TECHNIQUE: Contiguous axial images were obtained from the base of the skull through the vertex without intravenous contrast. COMPARISON:  None. FINDINGS: Brain: No evidence of acute infarction, hemorrhage, hydrocephalus, extra-axial collection or mass lesion/mass effect. Prominence of the ventricles and sulci reflects moderately severe cortical volume loss. Cerebellar atrophy is noted. Diffuse periventricular and subcortical matter change likely reflects small  vessel ischemic microangiopathy. The brainstem and fourth ventricle are within normal limits. The basal ganglia are unremarkable in appearance. The cerebral hemispheres demonstrate grossly normal gray-white differentiation. No mass effect or midline shift is seen. Vascular: No hyperdense vessel or unexpected calcification. Skull: There is no evidence of fracture; visualized osseous structures are unremarkable in appearance. Sinuses/Orbits: The orbits are within normal limits. The paranasal sinuses and mastoid air cells are well-aerated. Other: No significant soft tissue abnormalities are seen. IMPRESSION: 1. No evidence of traumatic intracranial injury or fracture. 2. Moderately severe cortical volume loss and diffuse small vessel ischemic microangiopathy. Electronically Signed   By: Garald Balding M.D.   On: 12/11/2016 04:43   Ct Chest Wo Contrast  Result Date: 12/11/2016 CLINICAL DATA:  Status post fall in bathroom. Hit back on tub. Left lateral chest pain and back pain. Initial encounter. EXAM: CT CHEST WITHOUT CONTRAST TECHNIQUE: Multidetector CT imaging of the chest was performed following the standard protocol without IV contrast. COMPARISON:  Chest and left rib radiographs performed earlier today at 4:15 a.m. FINDINGS: Cardiovascular: The ascending thoracic aorta measures 4.1 cm in AP dimension, within physiologic range for the patient's age. Scattered calcification is noted along the aortic arch and descending thoracic aorta. Scattered calcification is seen along the proximal great vessels. The heart remains normal in size. Diffuse coronary artery calcifications are seen. Mediastinum/Nodes: The mediastinum is otherwise unremarkable in appearance. No mediastinal lymphadenopathy is seen. No pericardial effusion is identified. The thyroid gland is diminutive and not well assessed. No axillary lymphadenopathy is seen. Lungs/Pleura: Mild atelectasis or scarring is noted at the lung bases. Bilateral emphysema  is noted. No pleural effusion or pneumothorax is seen. No masses are identified. Upper Abdomen: Scattered hypodensities within the liver are nonspecific but may reflect cysts, measuring up to 7.0 cm in size. The visualized portions of the spleen are unremarkable. The visualized portions of the gallbladder are within normal limits. The visualized portions of the pancreas, adrenal glands and kidneys are within normal limits. Nonspecific perinephric stranding is  noted bilaterally. Scattered calcification is noted along the proximal abdominal aorta. Musculoskeletal: There are minimally displaced fractures of the left posterior ninth and tenth ribs. Degenerative change is noted at the left glenohumeral joint, and at the lower cervical spine. There is grade 1 anterolisthesis of C7 on T1. Anterior bridging osteophytes are noted along the midthoracic spine. Multilevel vacuum phenomenon is noted along the lower thoracic spine. The visualized musculature is unremarkable in appearance. IMPRESSION: 1. Minimally displaced fractures of the left posterior ninth and tenth ribs. 2. Mild atelectasis or scarring at the lung bases. Bilateral emphysema noted. No evidence of pneumothorax. 3. Diffuse coronary artery calcifications seen. 4. Scattered aortic atherosclerosis. 5. Scattered hypodensities within the liver are nonspecific but may reflect cysts, measuring up to 7.0 cm in size. 6. Degenerative change at the left glenohumeral joint, lower cervical spine and lower thoracic spine. Electronically Signed   By: Garald Balding M.D.   On: 12/11/2016 07:08      Assessment/Plan Fall Right posterior rib fractures - Pulmonary toileting, IS - Pain control - Recommend PT and OT - Repeat chest x-ray tomorrow  We will continue to follow this patient. Thank you for the consult.  Kalman Drape, [facility] Surgery 12/11/2016, 3:21 PM Pager: [phone] Consults: [phone] Mon-Fri 7:00 am-4:30 pm Sat-Sun 7:00  am-11:30 am

## 2016-12-12 ENCOUNTER — Observation Stay (HOSPITAL_COMMUNITY): Payer: Medicare Other

## 2016-12-12 DIAGNOSIS — N179 Acute kidney failure, unspecified: Secondary | ICD-10-CM | POA: Diagnosis present

## 2016-12-12 DIAGNOSIS — I10 Essential (primary) hypertension: Secondary | ICD-10-CM | POA: Diagnosis not present

## 2016-12-12 DIAGNOSIS — Z91048 Other nonmedicinal substance allergy status: Secondary | ICD-10-CM | POA: Diagnosis not present

## 2016-12-12 DIAGNOSIS — Z96641 Presence of right artificial hip joint: Secondary | ICD-10-CM | POA: Diagnosis present

## 2016-12-12 DIAGNOSIS — Z79899 Other long term (current) drug therapy: Secondary | ICD-10-CM | POA: Diagnosis not present

## 2016-12-12 DIAGNOSIS — R0602 Shortness of breath: Secondary | ICD-10-CM | POA: Diagnosis not present

## 2016-12-12 DIAGNOSIS — Z8249 Family history of ischemic heart disease and other diseases of the circulatory system: Secondary | ICD-10-CM | POA: Diagnosis not present

## 2016-12-12 DIAGNOSIS — W1830XA Fall on same level, unspecified, initial encounter: Secondary | ICD-10-CM | POA: Diagnosis present

## 2016-12-12 DIAGNOSIS — J449 Chronic obstructive pulmonary disease, unspecified: Secondary | ICD-10-CM | POA: Diagnosis present

## 2016-12-12 DIAGNOSIS — E039 Hypothyroidism, unspecified: Secondary | ICD-10-CM | POA: Diagnosis not present

## 2016-12-12 DIAGNOSIS — Z87891 Personal history of nicotine dependence: Secondary | ICD-10-CM | POA: Diagnosis not present

## 2016-12-12 DIAGNOSIS — F329 Major depressive disorder, single episode, unspecified: Secondary | ICD-10-CM | POA: Diagnosis not present

## 2016-12-12 DIAGNOSIS — I129 Hypertensive chronic kidney disease with stage 1 through stage 4 chronic kidney disease, or unspecified chronic kidney disease: Secondary | ICD-10-CM | POA: Diagnosis present

## 2016-12-12 DIAGNOSIS — N183 Chronic kidney disease, stage 3 (moderate): Secondary | ICD-10-CM | POA: Diagnosis present

## 2016-12-12 DIAGNOSIS — Z91030 Bee allergy status: Secondary | ICD-10-CM | POA: Diagnosis not present

## 2016-12-12 DIAGNOSIS — J9621 Acute and chronic respiratory failure with hypoxia: Secondary | ICD-10-CM | POA: Diagnosis present

## 2016-12-12 DIAGNOSIS — W19XXXA Unspecified fall, initial encounter: Secondary | ICD-10-CM | POA: Diagnosis not present

## 2016-12-12 DIAGNOSIS — Z7982 Long term (current) use of aspirin: Secondary | ICD-10-CM | POA: Diagnosis not present

## 2016-12-12 DIAGNOSIS — Y92002 Bathroom of unspecified non-institutional (private) residence single-family (private) house as the place of occurrence of the external cause: Secondary | ICD-10-CM | POA: Diagnosis not present

## 2016-12-12 DIAGNOSIS — Z888 Allergy status to other drugs, medicaments and biological substances status: Secondary | ICD-10-CM | POA: Diagnosis not present

## 2016-12-12 DIAGNOSIS — Z66 Do not resuscitate: Secondary | ICD-10-CM | POA: Diagnosis present

## 2016-12-12 DIAGNOSIS — S2243XA Multiple fractures of ribs, bilateral, initial encounter for closed fracture: Secondary | ICD-10-CM | POA: Diagnosis present

## 2016-12-12 DIAGNOSIS — J9811 Atelectasis: Secondary | ICD-10-CM | POA: Diagnosis not present

## 2016-12-12 DIAGNOSIS — S2242XA Multiple fractures of ribs, left side, initial encounter for closed fracture: Secondary | ICD-10-CM | POA: Diagnosis not present

## 2016-12-12 LAB — CBC
HEMATOCRIT: 40.9 % (ref 39.0–52.0)
HEMOGLOBIN: 13 g/dL (ref 13.0–17.0)
MCH: 31.8 pg (ref 26.0–34.0)
MCHC: 31.8 g/dL (ref 30.0–36.0)
MCV: 100 fL (ref 78.0–100.0)
Platelets: 166 10*3/uL (ref 150–400)
RBC: 4.09 MIL/uL — ABNORMAL LOW (ref 4.22–5.81)
RDW: 13.7 % (ref 11.5–15.5)
WBC: 11 10*3/uL — ABNORMAL HIGH (ref 4.0–10.5)

## 2016-12-12 LAB — BASIC METABOLIC PANEL
ANION GAP: 6 (ref 5–15)
BUN: 40 mg/dL — AB (ref 6–20)
CO2: 29 mmol/L (ref 22–32)
Calcium: 8.8 mg/dL — ABNORMAL LOW (ref 8.9–10.3)
Chloride: 105 mmol/L (ref 101–111)
Creatinine, Ser: 1.57 mg/dL — ABNORMAL HIGH (ref 0.61–1.24)
GFR calc Af Amer: 42 mL/min — ABNORMAL LOW (ref >60)
GFR, EST NON AFRICAN AMERICAN: 36 mL/min — AB (ref >60)
GLUCOSE: 110 mg/dL — AB (ref 65–99)
POTASSIUM: 4.2 mmol/L (ref 3.5–5.1)
Sodium: 140 mmol/L (ref 135–145)

## 2016-12-12 MED ORDER — SODIUM CHLORIDE 0.45 % IV SOLN
INTRAVENOUS | Status: AC
  Administered 2016-12-12: 50 mL/h via INTRAVENOUS

## 2016-12-12 MED ORDER — LORATADINE 10 MG PO TABS
10.0000 mg | ORAL_TABLET | Freq: Every day | ORAL | Status: DC
  Administered 2016-12-12 – 2016-12-13 (×2): 10 mg via ORAL
  Filled 2016-12-12 (×2): qty 1

## 2016-12-12 NOTE — Progress Notes (Signed)
Patient having hard time voiding, tried in the bathroom but barely had an output. Bladder scanner shows 200 cc urine.

## 2016-12-12 NOTE — Evaluation (Signed)
Occupational Therapy Evaluation Patient Details Name: Ronald Strong MRN: [id number] DOB: April 08, 1921 Today's Date: 12/12/2016    History of Present Illness 83 yo male admtted after falling at home. Sustained L side rib fractures (9, 10).   Clinical Impression   This 83 year old man was admitted for the above.He will benefit from continued OT to increase safety and independence with adls.  Pt needs up to max A for LB adls and min guard assist for transfers. Goals are for supervision to mod A in acute.    Follow Up Recommendations  Supervision/Assistance - 24 hour;Home health OT    Equipment Recommendations  3 in 1 bedside commode    Recommendations for Other Services       Precautions / Restrictions Precautions Precautions: Fall Restrictions Weight Bearing Restrictions: No      Mobility Bed Mobility         Supine to sit: Min guard     General bed mobility comments: HOB raised  Transfers   Equipment used: Rolling walker (2 wheeled)   Sit to Stand: Min guard         General transfer comment: for safety     Balance                                           ADL either performed or assessed with clinical judgement   ADL Overall ADL's : Needs assistance/impaired     Grooming: Set up;Sitting   Upper Body Bathing: Set up;Sitting   Lower Body Bathing: Moderate assistance;Sit to/from stand   Upper Body Dressing : Minimal assistance;Sitting   Lower Body Dressing: Maximal assistance;Sit to/from stand   Toilet Transfer: Minimal assistance;Ambulation;Comfort height toilet   Toileting- Clothing Manipulation and Hygiene: Moderate assistance;Sit to/from stand         General ADL Comments: pt ambulated to bathroom and tried to urinate.  Performed UB adls.  Pt fatiqued and wanted to wait on LB     Vision         Perception     Praxis      Pertinent Vitals/Pain Pain Assessment: Faces Faces Pain Scale: Hurts even more Pain Location:  L side with movement Pain Descriptors / Indicators: Grimacing Pain Intervention(s): Limited activity within patient's tolerance;Monitored during session;Repositioned     Hand Dominance     Extremity/Trunk Assessment Upper Extremity Assessment Upper Extremity Assessment: Generalized weakness           Communication Communication Communication: HOH (wears bil hearing aides)   Cognition Arousal/Alertness: Awake/alert Behavior During Therapy: WFL for tasks assessed/performed Overall Cognitive Status: Within Functional Limits for tasks assessed                                 General Comments: pt feels confused; had dilaudid this am per RN   General Comments       Exercises     Shoulder Instructions      Home Living Family/patient expects to be discharged to:: Private residence Living Arrangements: Children Available Help at Discharge: Family Type of Home: House                 Bathroom Toilet: Standard     Home Equipment: Environmental consultant - 4 wheels          Prior Functioning/Environment Level of Independence: Independent with  assistive device(s)                 OT Problem List: Decreased strength;Decreased activity tolerance;Impaired balance (sitting and/or standing);Decreased knowledge of use of DME or AE;Pain      OT Treatment/Interventions: Self-care/ADL training;DME and/or AE instruction;Patient/family education;Balance training;Therapeutic activities    OT Goals(Current goals can be found in the care plan section) Acute Rehab OT Goals Patient Stated Goal: less pain OT Goal Formulation: With patient Time For Goal Achievement: 12/19/16 Potential to Achieve Goals: Good ADL Goals Pt Will Perform Grooming: standing;with supervision Pt Will Perform Lower Body Bathing: with min assist;sit to/from stand Pt Will Perform Lower Body Dressing: with mod assist;sit to/from stand Pt Will Transfer to Toilet: with supervision;ambulating;bedside  commode Pt Will Perform Toileting - Clothing Manipulation and hygiene: with supervision;sit to/from stand  OT Frequency: Min 2X/week   Barriers to D/C:            Co-evaluation              AM-PAC PT "6 Clicks" Daily Activity     Outcome Measure Help from another person eating meals?: None Help from another person taking care of personal grooming?: A Little Help from another person toileting, which includes using toliet, bedpan, or urinal?: A Lot Help from another person bathing (including washing, rinsing, drying)?: A Lot Help from another person to put on and taking off regular upper body clothing?: A Little Help from another person to put on and taking off regular lower body clothing?: A Lot 6 Click Score: 16   End of Session    Activity Tolerance: Patient limited by fatigue Patient left: in bed;with call bell/phone within reach;with bed alarm set  OT Visit Diagnosis: Unsteadiness on feet (R26.81);Muscle weakness (generalized) (M62.81)                Time: 6269-4854 OT Time Calculation (min): 18 min Charges:  OT General Charges $OT Visit: 1 Procedure OT Evaluation $OT Eval Low Complexity: 1 Procedure G-Codes: OT G-codes **NOT FOR INPATIENT CLASS** Functional Assessment Tool Used: Clinical judgement Functional Limitation: Self care Self Care Current Status (O2703): At least 60 percent but less than 80 percent impaired, limited or restricted Self Care Goal Status (J0093): At least 20 percent but less than 40 percent impaired, limited or restricted   Lesle Chris, OTR/L [phone] 12/12/2016  Ellyn Rubiano 12/12/2016, 1:00 PM

## 2016-12-12 NOTE — Progress Notes (Signed)
Spoke with patient and daughter at bedside. Patient lives a home, daughter is there part time. Patient very independent prior to admission. Daughter states she wants to have PCS for a short time, they are agreeable to PT. Provided daughter with private duty list for PCS, list for [facility] provided as well. Daughter requesting 3n1, has RW at home.

## 2016-12-12 NOTE — Progress Notes (Signed)
SATURATION QUALIFICATIONS: (This note is used to comply with regulatory documentation for home oxygen)  Patient Saturations on Room Air at Rest = 87%  Patient Saturations on Room Air while Ambulating = 82%  Patient Saturations on 2 Liters of oxygen while Ambulating = 99%  Please briefly explain why patient needs home oxygen:patients oxygen saturation decreases when o2 is removed even at rest.

## 2016-12-12 NOTE — Progress Notes (Signed)
[facility]/Trauma Progress Note      Assessment/Plan Fall Left posterior rib fractures - Pulmonary toileting, IS - Pain control - PT recommends HH/24hr sup - Repeat chest x-ray today showed no acute abnormalities  DISPO: Pain control and pulmonary toileting. Added Claritin. Pt states he takes benadryl for his allergies but with pain meds I feel Claritin would be better.       LOS: 0 days    Subjective:  CC: Rib pain  Pain worse with deep breath and certain movement. No new complaints. Pt states he has congestion at baseline due to his allergies which he takes benadryl for daily.   Objective: Vital signs in last 24 hours: Temp:  [97.5 F (36.4 C)-98.1 F (36.7 C)] 98 F (36.7 C) (08/14 0639) Pulse Rate:  [66-87] 87 (08/14 0639) Resp:  [16-20] 16 (08/14 0639) BP: (114-135)/(57-69) 135/69 (08/14 0639) SpO2:  [90 %-100 %] 90 % (08/14 0639) Last BM Date: 12/10/16  Intake/Output from previous day: 08/13 0701 - 08/14 0700 In: 4174 [P.O.:1020; I.V.:375] Out: 450 [Urine:450] Intake/Output this shift: No intake/output data recorded.  PE: Gen:  Alert, NAD, pleasant, cooperative Card:  RRR, no M/G/R heard Pulm:  CTA anteriorly, few expiratory wheezes noted, rate and effort normal Back: no ecchymosis, no crepitus, TTP L lower ribs Skin: no rashes noted, warm and dry   Anti-infectives: Anti-infectives    None      Lab Results:   Recent Labs  12/11/16 0359 12/12/16 0526  WBC 11.3* 11.0*  HGB 13.2 13.0  HCT 39.3 40.9  PLT 190 166   BMET  Recent Labs  12/11/16 0359 12/12/16 0526  NA 140 140  K 4.2 4.2  CL 105 105  CO2 28 29  GLUCOSE 103* 110*  BUN 34* 40*  CREATININE 1.47* 1.57*  CALCIUM 9.3 8.8*   PT/INR No results for input(s): LABPROT, INR in the last 72 hours. CMP     Component Value Date/Time   NA 140 12/12/2016 0526   K 4.2 12/12/2016 0526   CL 105 12/12/2016 0526   CO2 29 12/12/2016 0526   GLUCOSE 110 (H) 12/12/2016 0526    BUN 40 (H) 12/12/2016 0526   CREATININE 1.57 (H) 12/12/2016 0526   CALCIUM 8.8 (L) 12/12/2016 0526   PROT 6.3 (L) 12/11/2016 0359   ALBUMIN 3.8 12/11/2016 0359   AST 19 12/11/2016 0359   ALT 13 (L) 12/11/2016 0359   ALKPHOS 54 12/11/2016 0359   BILITOT 0.4 12/11/2016 0359   GFRNONAA 36 (L) 12/12/2016 0526   GFRAA 42 (L) 12/12/2016 0526   Lipase  No results found for: LIPASE  Studies/Results: Dg Chest 2 View  Result Date: 12/12/2016 CLINICAL DATA:  History of rib fracture. EXAM: CHEST  2 VIEW COMPARISON:  CT 12/11/2016. FINDINGS: Mediastinum and hilar structures are normal. Heart size normal. Low lung volumes with mild basilar atelectasis. Stable nodular and linear opacities over the left upper lung consistent with vessel on end and adjacent scarring. Bilateral pleural-parenchymal thickening consistent with scarring. No pleural effusion or pneumothorax. IMPRESSION: No acute cardiopulmonary disease . Electronically Signed   By: Marcello Moores  Register   On: 12/12/2016 08:44   Dg Ribs Unilateral W/chest Left  Result Date: 12/11/2016 CLINICAL DATA:  Status post fall backward, with left-sided rib pain. Initial encounter. EXAM: LEFT RIBS AND CHEST - 3+ VIEW COMPARISON:  None. FINDINGS: No displaced rib fractures are seen. The lungs are well-aerated and appear grossly clear. There is no evidence of focal opacification, pleural effusion  or pneumothorax. The cardiomediastinal silhouette is within normal limits. No acute osseous abnormalities are seen. IMPRESSION: No displaced rib fracture seen. No acute cardiopulmonary process seen. Electronically Signed   By: Garald Balding M.D.   On: 12/11/2016 04:44   Ct Head Wo Contrast  Result Date: 12/11/2016 CLINICAL DATA:  Status post fall backwards, with concern for head injury. Initial encounter. EXAM: CT HEAD WITHOUT CONTRAST TECHNIQUE: Contiguous axial images were obtained from the base of the skull through the vertex without intravenous contrast.  COMPARISON:  None. FINDINGS: Brain: No evidence of acute infarction, hemorrhage, hydrocephalus, extra-axial collection or mass lesion/mass effect. Prominence of the ventricles and sulci reflects moderately severe cortical volume loss. Cerebellar atrophy is noted. Diffuse periventricular and subcortical matter change likely reflects small vessel ischemic microangiopathy. The brainstem and fourth ventricle are within normal limits. The basal ganglia are unremarkable in appearance. The cerebral hemispheres demonstrate grossly normal gray-white differentiation. No mass effect or midline shift is seen. Vascular: No hyperdense vessel or unexpected calcification. Skull: There is no evidence of fracture; visualized osseous structures are unremarkable in appearance. Sinuses/Orbits: The orbits are within normal limits. The paranasal sinuses and mastoid air cells are well-aerated. Other: No significant soft tissue abnormalities are seen. IMPRESSION: 1. No evidence of traumatic intracranial injury or fracture. 2. Moderately severe cortical volume loss and diffuse small vessel ischemic microangiopathy. Electronically Signed   By: Garald Balding M.D.   On: 12/11/2016 04:43   Ct Chest Wo Contrast  Result Date: 12/11/2016 CLINICAL DATA:  Status post fall in bathroom. Hit back on tub. Left lateral chest pain and back pain. Initial encounter. EXAM: CT CHEST WITHOUT CONTRAST TECHNIQUE: Multidetector CT imaging of the chest was performed following the standard protocol without IV contrast. COMPARISON:  Chest and left rib radiographs performed earlier today at 4:15 a.m. FINDINGS: Cardiovascular: The ascending thoracic aorta measures 4.1 cm in AP dimension, within physiologic range for the patient's age. Scattered calcification is noted along the aortic arch and descending thoracic aorta. Scattered calcification is seen along the proximal great vessels. The heart remains normal in size. Diffuse coronary artery calcifications are  seen. Mediastinum/Nodes: The mediastinum is otherwise unremarkable in appearance. No mediastinal lymphadenopathy is seen. No pericardial effusion is identified. The thyroid gland is diminutive and not well assessed. No axillary lymphadenopathy is seen. Lungs/Pleura: Mild atelectasis or scarring is noted at the lung bases. Bilateral emphysema is noted. No pleural effusion or pneumothorax is seen. No masses are identified. Upper Abdomen: Scattered hypodensities within the liver are nonspecific but may reflect cysts, measuring up to 7.0 cm in size. The visualized portions of the spleen are unremarkable. The visualized portions of the gallbladder are within normal limits. The visualized portions of the pancreas, adrenal glands and kidneys are within normal limits. Nonspecific perinephric stranding is noted bilaterally. Scattered calcification is noted along the proximal abdominal aorta. Musculoskeletal: There are minimally displaced fractures of the left posterior ninth and tenth ribs. Degenerative change is noted at the left glenohumeral joint, and at the lower cervical spine. There is grade 1 anterolisthesis of C7 on T1. Anterior bridging osteophytes are noted along the midthoracic spine. Multilevel vacuum phenomenon is noted along the lower thoracic spine. The visualized musculature is unremarkable in appearance. IMPRESSION: 1. Minimally displaced fractures of the left posterior ninth and tenth ribs. 2. Mild atelectasis or scarring at the lung bases. Bilateral emphysema noted. No evidence of pneumothorax. 3. Diffuse coronary artery calcifications seen. 4. Scattered aortic atherosclerosis. 5. Scattered hypodensities within the liver are  nonspecific but may reflect cysts, measuring up to 7.0 cm in size. 6. Degenerative change at the left glenohumeral joint, lower cervical spine and lower thoracic spine. Electronically Signed   By: Garald Balding M.D.   On: 12/11/2016 07:08      Kalman Drape , [facility] 12/12/2016, 9:14 AM Pager: [phone] Consults: [phone] Mon-Fri 7:00 am-4:30 pm Sat-Sun 7:00 am-11:30 am

## 2016-12-12 NOTE — Progress Notes (Signed)
[facility] PROGRESS NOTE  Ronald Strong HKV:[id number] DOB: Mar 09, 1921 DOA: 12/11/2016  PCP: Dorena Cookey, MD  Brief History/Interval Summary: 83 year old Caucasian male with a past medical history of depression, hypothyroidism, hypertension, who is usually quite independent at baseline, had a mechanical fall resulting in left rib fractures. Due to significant pain and inability to move around he was hospitalized for further management.  Reason for Visit: Rib fracture  Consultants: Gen. surgery  Procedures: None  Antibiotics: None  Subjective/Interval History: Patient noted to be slightly distracted this morning. His daughter thinks that he is slightly confused. Otherwise, he states that he is feeling okay. Continues to have discomfort when he moves. He does have a cough which is dry.  ROS: No nausea or vomiting  Objective:  Vital Signs  Vitals:   12/11/16 2122 12/12/16 0639 12/12/16 1000 12/12/16 1001  BP: 128/64 135/69    Pulse: 74 87    Resp: 18 16    Temp: (!) 97.5 F (36.4 C) 98 F (36.7 C)    TempSrc: Oral Oral    SpO2: 95% 90% (!) 87% (!) 82%  Weight:      Height:        Intake/Output Summary (Last 24 hours) at 12/12/16 1138 Last data filed at 12/12/16 5643  Gross per 24 hour  Intake             1515 ml  Output              500 ml  Net             1015 ml   Filed Weights   12/11/16 0325  Weight: 63.5 kg (140 lb)    General appearance: alert, cooperative, appears stated age, distracted and no distress Resp: Diminished air entry at the bases with a few crackles on the left. Some upper airway wheezing is noted. No tachypnea. Cardio: regular rate and rhythm, S1, S2 normal, no murmur, click, rub or gallop GI: soft, non-tender; bowel sounds normal; no masses,  no organomegaly Extremities: extremities normal, atraumatic, no cyanosis or edema Neurologic: Awake, alert, mildly distracted. No always focal neurological deficits.  Lab Results:  Data  Reviewed: I have personally reviewed following labs and imaging studies  CBC:  Recent Labs Lab 12/11/16 0359 12/12/16 0526  WBC 11.3* 11.0*  NEUTROABS 8.6*  --   HGB 13.2 13.0  HCT 39.3 40.9  MCV 94.7 100.0  PLT 190 329    Basic Metabolic Panel:  Recent Labs Lab 12/11/16 0359 12/12/16 0526  NA 140 140  K 4.2 4.2  CL 105 105  CO2 28 29  GLUCOSE 103* 110*  BUN 34* 40*  CREATININE 1.47* 1.57*  CALCIUM 9.3 8.8*    GFR: Estimated Creatinine Clearance: 26.4 mL/min (A) (by C-G formula based on SCr of 1.57 mg/dL (H)).  Liver Function Tests:  Recent Labs Lab 12/11/16 0359  AST 19  ALT 13*  ALKPHOS 54  BILITOT 0.4  PROT 6.3*  ALBUMIN 3.8    Radiology Studies: Dg Chest 2 View  Result Date: 12/12/2016 CLINICAL DATA:  History of rib fracture. EXAM: CHEST  2 VIEW COMPARISON:  CT 12/11/2016. FINDINGS: Mediastinum and hilar structures are normal. Heart size normal. Low lung volumes with mild basilar atelectasis. Stable nodular and linear opacities over the left upper lung consistent with vessel on end and adjacent scarring. Bilateral pleural-parenchymal thickening consistent with scarring. No pleural effusion or pneumothorax. IMPRESSION: No acute cardiopulmonary disease . Electronically Signed   By:  [place]   On: 12/12/2016 08:44   Dg Ribs Unilateral W/chest Left  Result Date: 12/11/2016 CLINICAL DATA:  Status post fall backward, with left-sided rib pain. Initial encounter. EXAM: LEFT RIBS AND CHEST - 3+ VIEW COMPARISON:  None. FINDINGS: No displaced rib fractures are seen. The lungs are well-aerated and appear grossly clear. There is no evidence of focal opacification, pleural effusion or pneumothorax. The cardiomediastinal silhouette is within normal limits. No acute osseous abnormalities are seen. IMPRESSION: No displaced rib fracture seen. No acute cardiopulmonary process seen. Electronically Signed   By: Garald Balding M.D.   On: 12/11/2016 04:44   Ct Head Wo  Contrast  Result Date: 12/11/2016 CLINICAL DATA:  Status post fall backwards, with concern for head injury. Initial encounter. EXAM: CT HEAD WITHOUT CONTRAST TECHNIQUE: Contiguous axial images were obtained from the base of the skull through the vertex without intravenous contrast. COMPARISON:  None. FINDINGS: Brain: No evidence of acute infarction, hemorrhage, hydrocephalus, extra-axial collection or mass lesion/mass effect. Prominence of the ventricles and sulci reflects moderately severe cortical volume loss. Cerebellar atrophy is noted. Diffuse periventricular and subcortical matter change likely reflects small vessel ischemic microangiopathy. The brainstem and fourth ventricle are within normal limits. The basal ganglia are unremarkable in appearance. The cerebral hemispheres demonstrate grossly normal gray-white differentiation. No mass effect or midline shift is seen. Vascular: No hyperdense vessel or unexpected calcification. Skull: There is no evidence of fracture; visualized osseous structures are unremarkable in appearance. Sinuses/Orbits: The orbits are within normal limits. The paranasal sinuses and mastoid air cells are well-aerated. Other: No significant soft tissue abnormalities are seen. IMPRESSION: 1. No evidence of traumatic intracranial injury or fracture. 2. Moderately severe cortical volume loss and diffuse small vessel ischemic microangiopathy. Electronically Signed   By: Garald Balding M.D.   On: 12/11/2016 04:43   Ct Chest Wo Contrast  Result Date: 12/11/2016 CLINICAL DATA:  Status post fall in bathroom. Hit back on tub. Left lateral chest pain and back pain. Initial encounter. EXAM: CT CHEST WITHOUT CONTRAST TECHNIQUE: Multidetector CT imaging of the chest was performed following the standard protocol without IV contrast. COMPARISON:  Chest and left rib radiographs performed earlier today at 4:15 a.m. FINDINGS: Cardiovascular: The ascending thoracic aorta measures 4.1 cm in AP  dimension, within physiologic range for the patient's age. Scattered calcification is noted along the aortic arch and descending thoracic aorta. Scattered calcification is seen along the proximal great vessels. The heart remains normal in size. Diffuse coronary artery calcifications are seen. Mediastinum/Nodes: The mediastinum is otherwise unremarkable in appearance. No mediastinal lymphadenopathy is seen. No pericardial effusion is identified. The thyroid gland is diminutive and not well assessed. No axillary lymphadenopathy is seen. Lungs/Pleura: Mild atelectasis or scarring is noted at the lung bases. Bilateral emphysema is noted. No pleural effusion or pneumothorax is seen. No masses are identified. Upper Abdomen: Scattered hypodensities within the liver are nonspecific but may reflect cysts, measuring up to 7.0 cm in size. The visualized portions of the spleen are unremarkable. The visualized portions of the gallbladder are within normal limits. The visualized portions of the pancreas, adrenal glands and kidneys are within normal limits. Nonspecific perinephric stranding is noted bilaterally. Scattered calcification is noted along the proximal abdominal aorta. Musculoskeletal: There are minimally displaced fractures of the left posterior ninth and tenth ribs. Degenerative change is noted at the left glenohumeral joint, and at the lower cervical spine. There is grade 1 anterolisthesis of C7 on T1. Anterior bridging osteophytes are noted  along the midthoracic spine. Multilevel vacuum phenomenon is noted along the lower thoracic spine. The visualized musculature is unremarkable in appearance. IMPRESSION: 1. Minimally displaced fractures of the left posterior ninth and tenth ribs. 2. Mild atelectasis or scarring at the lung bases. Bilateral emphysema noted. No evidence of pneumothorax. 3. Diffuse coronary artery calcifications seen. 4. Scattered aortic atherosclerosis. 5. Scattered hypodensities within the liver  are nonspecific but may reflect cysts, measuring up to 7.0 cm in size. 6. Degenerative change at the left glenohumeral joint, lower cervical spine and lower thoracic spine. Electronically Signed   By: Garald Balding M.D.   On: 12/11/2016 07:08     Medications:  Scheduled: . aspirin  325 mg Oral Daily  . citalopram  20 mg Oral Daily  . docusate sodium  100 mg Oral BID  . enoxaparin (LOVENOX) injection  30 mg Subcutaneous Daily  .  HYDROmorphone (DILAUDID) injection  0.5 mg Intravenous Once  . levothyroxine  137 mcg Oral QAC breakfast  . loratadine  10 mg Oral Daily  . multivitamin with minerals  1 tablet Oral Daily  . polyethylene glycol  17 g Oral Daily  . tamsulosin  0.4 mg Oral q morning - 10a  . traMADol  50 mg Oral TID   Continuous: . sodium chloride     FMB:[id number] **OR** acetaminophen, HYDROmorphone (DILAUDID) injection, ondansetron **OR** ondansetron (ZOFRAN) IV, oxyCODONE  Assessment/Plan:  Principal Problem:   Multiple closed fractures of ribs of left side Active Problems:   Hypothyroidism   Depression   Rib fracture   Essential hypertension    Fall resulting in left 9th and 10th posterior fracture/hypoxia Hypoxia is due to poor effort and splinting. Continues to have low oxygen saturations. He will likely need to have home oxygen at discharge. His pain continues to be present. Continues to require pain medication. Mildly distracted this morning, which could be due to pain medication or the fact that he is in the hospital. Reorient daily. PT and OT evaluation. He will likely need home health. Daughter trying to arrange somebody to be with him at all times, since she does work. Continue pulmonary toilet.  History of hypothyroidism. Continue with his home medications.  Chronic kidney disease stage III. Mild rise in creatinine noted. Could be due to poor oral intake. Gentle hydration. Repeat labs tomorrow. Monitor urine output.  History of depression. Continue  home medication.  History of essential hypertension.  Blood pressure is reasonably well controlled. Continue to monitor.  Nonspecific findings on CT This included possible liver cysts. This can be further evaluated by outpatient providers. Considering his advanced age, he is likely not a candidate for any aggressive workup.  DVT Prophylaxis: Lovenox    Code Status: DO NOT RESUSCITATE  Family Communication: Discussed with the patient and his daughter  Disposition Plan: Management as outlined above.    LOS: 0 days   [facility] Pager [phone] 12/12/2016, 11:39 AM  If 7PM-7AM, please contact night-coverage at [email or web address], password [facility]

## 2016-12-13 ENCOUNTER — Inpatient Hospital Stay (HOSPITAL_COMMUNITY): Payer: Medicare Other

## 2016-12-13 DIAGNOSIS — I10 Essential (primary) hypertension: Secondary | ICD-10-CM

## 2016-12-13 DIAGNOSIS — S2242XA Multiple fractures of ribs, left side, initial encounter for closed fracture: Secondary | ICD-10-CM

## 2016-12-13 DIAGNOSIS — N179 Acute kidney failure, unspecified: Secondary | ICD-10-CM

## 2016-12-13 DIAGNOSIS — J9621 Acute and chronic respiratory failure with hypoxia: Secondary | ICD-10-CM

## 2016-12-13 DIAGNOSIS — W19XXXA Unspecified fall, initial encounter: Secondary | ICD-10-CM

## 2016-12-13 DIAGNOSIS — J449 Chronic obstructive pulmonary disease, unspecified: Secondary | ICD-10-CM

## 2016-12-13 LAB — BASIC METABOLIC PANEL
Anion gap: 5 (ref 5–15)
BUN: 27 mg/dL — ABNORMAL HIGH (ref 6–20)
CALCIUM: 8.7 mg/dL — AB (ref 8.9–10.3)
CO2: 29 mmol/L (ref 22–32)
CREATININE: 1.16 mg/dL (ref 0.61–1.24)
Chloride: 104 mmol/L (ref 101–111)
GFR calc non Af Amer: 52 mL/min — ABNORMAL LOW (ref >60)
GLUCOSE: 107 mg/dL — AB (ref 65–99)
Potassium: 3.9 mmol/L (ref 3.5–5.1)
Sodium: 138 mmol/L (ref 135–145)

## 2016-12-13 MED ORDER — TRAMADOL HCL 50 MG PO TABS: 50.0000 mg | ORAL_TABLET | Freq: Three times a day (TID) | ORAL | 0 refills | Status: DC

## 2016-12-13 MED ORDER — OXYCODONE HCL 5 MG PO TABS: 5.0000 mg | ORAL_TABLET | ORAL | 0 refills | Status: DC | PRN

## 2016-12-13 NOTE — Progress Notes (Signed)
Pt has tried inhalers and albuterol and still requires oxygen therapy to maintain stats above 90.

## 2016-12-13 NOTE — Progress Notes (Signed)
Spoke with Ronald Strong's daughter and she authorized Ronald Strong, the private worker that she hired to take her father home. She stated she will not be able to come to the hospital today.

## 2016-12-13 NOTE — Discharge Summary (Addendum)
Physician Discharge Summary  Ronald Strong TDD:[id number] DOB: 07-13-1920 DOA: 12/11/2016  PCP: Dorena Cookey, MD  Admit date: 12/11/2016 Discharge date: 12/13/2016  Admitted From: home Disposition:  Home  Recommendations for Outpatient Follow-up:  1. Follow up with PCP in 1-2 weeks, Reevaluate the need of oxygen in 2 weeks.   Home Health:Yes Equipment/Devices:home oxygen  Discharge Condition:stable CODE STATUS:Full  Diet recommendation: Heart Healthy  Brief/Interim Summary: 83 year old Caucasian male with a past medical history of depression, hypothyroidism, hypertension, who is usually quite independent at baseline, had a mechanical fall resulting in left rib fractures. Due to significant pain and inability to move around he was hospitalized for further management  Discharge Diagnoses:  Principal Problem:   Multiple closed fractures of ribs of left side Active Problems:   Hypothyroidism   Depression   Rib fracture   Essential hypertension   COPD (chronic obstructive pulmonary disease) (HCC)   Acute on chronic respiratory failure with hypoxia (HCC)   AKI (acute kidney injury) (HCC)  Mechanical fall resulting in Multiple closed fractures of ribs of left side and hypoxia: Likely due to poor effort due to his splinting and pain, continues to desat and has high oxygen requirements. Will need oxygen for home. Evaluated the patient recommended home health and 24-hour supervision Chest x-ray shows no acute findings. Patient will go home on oxygen every time we did all his oxygen he desaturates to the pain, will need to be reevaluated in 2 weeks. Home on tramadol which he relates is helping with the pain.  Nonspecific CT scan findings: Possible liver cyst which can be further evaluated as an outpatient.  AKI: Likely pre-renal, resolved with IV hydration.  All other medical problems are stable no changes made to his medication.  Discharge Instructions  Discharge  Instructions    Diet - low sodium heart healthy    Complete by:  As directed    Increase activity slowly    Complete by:  As directed      Allergies as of 12/13/2016      Reactions   Bee Venom Anaphylaxis   Epinephrine Anaphylaxis   severe anaphylaxis   Pollen Extract    sneezing      Medication List    TAKE these medications   aspirin 325 MG EC tablet Take 325 mg by mouth 2 (two) times daily.   citalopram 20 MG tablet Commonly known as:  CELEXA Take 1 tablet (20 mg total) by mouth daily.   diphenhydrAMINE 25 mg capsule Commonly known as:  BENADRYL Take 25 mg by mouth daily.   multivitamin with minerals Tabs tablet Take 1 tablet by mouth daily.   SYNTHROID 137 MCG tablet Generic drug:  levothyroxine TAKE 1 TABLET (137 MCG TOTAL) BY MOUTH DAILY BEFORE BREAKFAST.   tamsulosin 0.4 MG Caps capsule Commonly known as:  FLOMAX TAKE 1 CAPSULE (0.4 MG TOTAL) BY MOUTH EVERY MORNING.   traMADol 50 MG tablet Commonly known as:  ULTRAM Take 1 tablet (50 mg total) by mouth 3 (three) times daily.   verapamil 120 MG tablet Commonly known as:  CALAN TAKE 1 TABLET (120 MG TOTAL) BY MOUTH EVERY MORNING.   zolpidem 5 MG tablet Commonly known as:  AMBIEN 1 tab daily at bedtime What changed:  how much to take  how to take this  when to take this  additional instructions            Durable Medical Equipment        Start  Ordered   12/13/16 1257  For home use only DME oxygen  Once    Question Answer Comment  Mode or (Route) Nasal cannula   Liters per Minute 2   Frequency Continuous (stationary and portable oxygen unit needed)   Oxygen delivery system Gas      12/13/16 1256   12/12/16 1126  For home use only DME 3 n 1  Once     12/12/16 1125     Follow-up Information    [facility]. Schedule an appointment as soon as possible for a visit in 2 week(s).   Contact information: [place]  [id number] [phone]         Allergies  Allergen Reactions  . Bee Venom Anaphylaxis  . Epinephrine Anaphylaxis    severe anaphylaxis  . Pollen Extract     sneezing    Consultations:  Gen. surgery   Procedures/Studies: Dg Chest 2 View  Result Date: 12/12/2016 CLINICAL DATA:  History of rib fracture. EXAM: CHEST  2 VIEW COMPARISON:  CT 12/11/2016. FINDINGS: Mediastinum and hilar structures are normal. Heart size normal. Low lung volumes with mild basilar atelectasis. Stable nodular and linear opacities over the left upper lung consistent with vessel on end and adjacent scarring. Bilateral pleural-parenchymal thickening consistent with scarring. No pleural effusion or pneumothorax. IMPRESSION: No acute cardiopulmonary disease . Electronically Signed   By: Marcello Moores  Register   On: 12/12/2016 08:44   Dg Ribs Unilateral W/chest Left  Result Date: 12/11/2016 CLINICAL DATA:  Status post fall backward, with left-sided rib pain. Initial encounter. EXAM: LEFT RIBS AND CHEST - 3+ VIEW COMPARISON:  None. FINDINGS: No displaced rib fractures are seen. The lungs are well-aerated and appear grossly clear. There is no evidence of focal opacification, pleural effusion or pneumothorax. The cardiomediastinal silhouette is within normal limits. No acute osseous abnormalities are seen. IMPRESSION: No displaced rib fracture seen. No acute cardiopulmonary process seen. Electronically Signed   By: Garald Balding M.D.   On: 12/11/2016 04:44   Ct Head Wo Contrast  Result Date: 12/11/2016 CLINICAL DATA:  Status post fall backwards, with concern for head injury. Initial encounter. EXAM: CT HEAD WITHOUT CONTRAST TECHNIQUE: Contiguous axial images were obtained from the base of the skull through the vertex without intravenous contrast. COMPARISON:  None. FINDINGS: Brain: No evidence of acute infarction, hemorrhage, hydrocephalus, extra-axial collection or mass lesion/mass effect. Prominence of the ventricles and  sulci reflects moderately severe cortical volume loss. Cerebellar atrophy is noted. Diffuse periventricular and subcortical matter change likely reflects small vessel ischemic microangiopathy. The brainstem and fourth ventricle are within normal limits. The basal ganglia are unremarkable in appearance. The cerebral hemispheres demonstrate grossly normal gray-white differentiation. No mass effect or midline shift is seen. Vascular: No hyperdense vessel or unexpected calcification. Skull: There is no evidence of fracture; visualized osseous structures are unremarkable in appearance. Sinuses/Orbits: The orbits are within normal limits. The paranasal sinuses and mastoid air cells are well-aerated. Other: No significant soft tissue abnormalities are seen. IMPRESSION: 1. No evidence of traumatic intracranial injury or fracture. 2. Moderately severe cortical volume loss and diffuse small vessel ischemic microangiopathy. Electronically Signed   By: Garald Balding M.D.   On: 12/11/2016 04:43   Ct Chest Wo Contrast  Result Date: 12/11/2016 CLINICAL DATA:  Status post fall in bathroom. Hit back on tub. Left lateral chest pain and back pain. Initial encounter. EXAM: CT CHEST WITHOUT CONTRAST TECHNIQUE: Multidetector CT imaging of the  chest was performed following the standard protocol without IV contrast. COMPARISON:  Chest and left rib radiographs performed earlier today at 4:15 a.m. FINDINGS: Cardiovascular: The ascending thoracic aorta measures 4.1 cm in AP dimension, within physiologic range for the patient's age. Scattered calcification is noted along the aortic arch and descending thoracic aorta. Scattered calcification is seen along the proximal great vessels. The heart remains normal in size. Diffuse coronary artery calcifications are seen. Mediastinum/Nodes: The mediastinum is otherwise unremarkable in appearance. No mediastinal lymphadenopathy is seen. No pericardial effusion is identified. The thyroid gland is  diminutive and not well assessed. No axillary lymphadenopathy is seen. Lungs/Pleura: Mild atelectasis or scarring is noted at the lung bases. Bilateral emphysema is noted. No pleural effusion or pneumothorax is seen. No masses are identified. Upper Abdomen: Scattered hypodensities within the liver are nonspecific but may reflect cysts, measuring up to 7.0 cm in size. The visualized portions of the spleen are unremarkable. The visualized portions of the gallbladder are within normal limits. The visualized portions of the pancreas, adrenal glands and kidneys are within normal limits. Nonspecific perinephric stranding is noted bilaterally. Scattered calcification is noted along the proximal abdominal aorta. Musculoskeletal: There are minimally displaced fractures of the left posterior ninth and tenth ribs. Degenerative change is noted at the left glenohumeral joint, and at the lower cervical spine. There is grade 1 anterolisthesis of C7 on T1. Anterior bridging osteophytes are noted along the midthoracic spine. Multilevel vacuum phenomenon is noted along the lower thoracic spine. The visualized musculature is unremarkable in appearance. IMPRESSION: 1. Minimally displaced fractures of the left posterior ninth and tenth ribs. 2. Mild atelectasis or scarring at the lung bases. Bilateral emphysema noted. No evidence of pneumothorax. 3. Diffuse coronary artery calcifications seen. 4. Scattered aortic atherosclerosis. 5. Scattered hypodensities within the liver are nonspecific but may reflect cysts, measuring up to 7.0 cm in size. 6. Degenerative change at the left glenohumeral joint, lower cervical spine and lower thoracic spine. Electronically Signed   By: Garald Balding M.D.   On: 12/11/2016 07:08   Dg Chest Port 1 View  Result Date: 12/13/2016 CLINICAL DATA:  Shortness of breath.  Left rib fractures. EXAM: PORTABLE CHEST 1 VIEW COMPARISON:  Chest x-ray 12/12/2016.  CT 12/11/2016. FINDINGS: Mediastinum and hilar  structures are normal. Heart size normal. COPD. Mild bibasilar subsegmental atelectasis. Tiny pleural effusions cannot be excluded. No pneumothorax. Left posterior ninth rib fracture best identified by prior CT. IMPRESSION: 1. COPD. Mild bibasilar subsegmental atelectasis. Tiny bilateral pleural effusions cannot be excluded. 2. Left posterior ninth rib fracture best identified by CT. No pneumothorax. Electronically Signed   By: Marcello Moores  Register   On: 12/13/2016 11:00     Subjective: No complains.  Discharge Exam: Vitals:   12/12/16 2111 12/13/16 0537  BP: 137/72 140/63  Pulse: 79 78  Resp: 16 16  Temp: 98 F (36.7 C) 97.7 F (36.5 C)  SpO2: 99% 98%   Vitals:   12/12/16 1310 12/12/16 1539 12/12/16 2111 12/13/16 0537  BP: 138/70 125/70 137/72 140/63  Pulse: 82 76 79 78  Resp: 18 16 16 16   Temp: 97.9 F (36.6 C) 97.8 F (36.6 C) 98 F (36.7 C) 97.7 F (36.5 C)  TempSrc: Oral Oral Oral Oral  SpO2: 99% 99% 99% 98%  Weight:      Height:        General: Pt is alert, awake, not in acute distress Cardiovascular: RRR, S1/S2 +, no rubs, no gallops Respiratory: CTA bilaterally, no wheezing,  no rhonchi Abdominal: Soft, NT, ND, bowel sounds + Extremities: no edema, no cyanosis    The results of significant diagnostics from this hospitalization (including imaging, microbiology, ancillary and laboratory) are listed below for reference.     Microbiology: No results found for this or any previous visit (from the past 240 hour(s)).   Labs: BNP (last 3 results) No results for input(s): BNP in the last 8760 hours. Basic Metabolic Panel:  Recent Labs Lab 12/11/16 0359 12/12/16 0526 12/13/16 0523  NA 140 140 138  K 4.2 4.2 3.9  CL 105 105 104  CO2 28 29 29   GLUCOSE 103* 110* 107*  BUN 34* 40* 27*  CREATININE 1.47* 1.57* 1.16  CALCIUM 9.3 8.8* 8.7*   Liver Function Tests:  Recent Labs Lab 12/11/16 0359  AST 19  ALT 13*  ALKPHOS 54  BILITOT 0.4  PROT 6.3*  ALBUMIN  3.8   No results for input(s): LIPASE, AMYLASE in the last 168 hours. No results for input(s): AMMONIA in the last 168 hours. CBC:  Recent Labs Lab 12/11/16 0359 12/12/16 0526  WBC 11.3* 11.0*  NEUTROABS 8.6*  --   HGB 13.2 13.0  HCT 39.3 40.9  MCV 94.7 100.0  PLT 190 166   Cardiac Enzymes: No results for input(s): CKTOTAL, CKMB, CKMBINDEX, TROPONINI in the last 168 hours. BNP: Invalid input(s): POCBNP CBG: No results for input(s): GLUCAP in the last 168 hours. D-Dimer No results for input(s): DDIMER in the last 72 hours. Hgb A1c No results for input(s): HGBA1C in the last 72 hours. Lipid Profile No results for input(s): CHOL, HDL, LDLCALC, TRIG, CHOLHDL, LDLDIRECT in the last 72 hours. Thyroid function studies No results for input(s): TSH, T4TOTAL, T3FREE, THYROIDAB in the last 72 hours.  Invalid input(s): FREET3 Anemia work up No results for input(s): VITAMINB12, FOLATE, FERRITIN, TIBC, IRON, RETICCTPCT in the last 72 hours. Urinalysis No results found for: COLORURINE, APPEARANCEUR, LABSPEC, [place], GLUCOSEU, HGBUR, BILIRUBINUR, KETONESUR, PROTEINUR, UROBILINOGEN, NITRITE, LEUKOCYTESUR Sepsis Labs Invalid input(s): PROCALCITONIN,  WBC,  LACTICIDVEN Microbiology No results found for this or any previous visit (from the past 240 hour(s)).   Time coordinating discharge: Over 30 minutes  SIGNED:   Charlynne Cousins, MD  [facility] 12/13/2016, 5:02 PM Pager   If 7PM-7AM, please contact night-coverage [email or web address] Password [email or web address]

## 2016-12-13 NOTE — Progress Notes (Signed)
Contacted patient's daughter regarding d/c plans and HH choice. Daughter has already made arrangements for [facility] through a private agency. She had no preference for a HH agency, contacted [facility], they accepted referral. Patient also with orders for home O2, contacted [facility] for delivery of O2. Caregivers present and plan to accompany patient and daughter home.

## 2016-12-13 NOTE — Progress Notes (Signed)
Pt was discharged home today. Instructions were reviewed with patient,and caregiver,questions were answered. Pt was taken to main entrance via wheelchair by NT.

## 2016-12-13 NOTE — Progress Notes (Signed)
[facility]/Trauma Progress Note      Assessment/Plan Fall Left posterior rib fractures - Pulmonary toileting, IS - Pain control - PT recommends HH/24hr sup - Repeat chest x-ray 08/14 showed no acute abnormalities  DISPO: Pain control and pulmonary toileting. Pt requiring O2 to keep sats up, repeat chest xray pending   LOS: 1 day    Subjective: CC: Rib pain  Pain is improved but complaining of sensation of broken ribs moving when taking a deep breath or with certain movements. Pt states he has a lot of congestion since admission. Has not been up walking much.  Objective: Vital signs in last 24 hours: Temp:  [97.7 F (36.5 C)-98 F (36.7 C)] 97.7 F (36.5 C) (08/15 0537) Pulse Rate:  [76-82] 78 (08/15 0537) Resp:  [16-18] 16 (08/15 0537) BP: (125-140)/(63-72) 140/63 (08/15 0537) SpO2:  [82 %-99 %] 98 % (08/15 0537) Last BM Date: 12/10/16  Intake/Output from previous day: 08/14 0701 - 08/15 0700 In: 1044.2 [P.O.:960; I.V.:84.2] Out: 1560 [Urine:1560] Intake/Output this shift: Total I/O In: 60 [P.O.:60] Out: -   PE: Gen:  Alert, NAD, pleasant, cooperative Card:  RRR, no M/G/R heard Pulm:  CTA anteriorly, few expiratory wheezes noted, rate and effort normal Back: TTP L lower ribs Skin: no rashes noted, warm and dry   Anti-infectives: Anti-infectives    None      Lab Results:   Recent Labs  12/11/16 0359 12/12/16 0526  WBC 11.3* 11.0*  HGB 13.2 13.0  HCT 39.3 40.9  PLT 190 166   BMET  Recent Labs  12/12/16 0526 12/13/16 0523  NA 140 138  K 4.2 3.9  CL 105 104  CO2 29 29  GLUCOSE 110* 107*  BUN 40* 27*  CREATININE 1.57* 1.16  CALCIUM 8.8* 8.7*   PT/INR No results for input(s): LABPROT, INR in the last 72 hours. CMP     Component Value Date/Time   NA 138 12/13/2016 0523   K 3.9 12/13/2016 0523   CL 104 12/13/2016 0523   CO2 29 12/13/2016 0523   GLUCOSE 107 (H) 12/13/2016 0523   BUN 27 (H) 12/13/2016 0523   CREATININE 1.16 12/13/2016 0523   CALCIUM 8.7 (L) 12/13/2016 0523   PROT 6.3 (L) 12/11/2016 0359   ALBUMIN 3.8 12/11/2016 0359   AST 19 12/11/2016 0359   ALT 13 (L) 12/11/2016 0359   ALKPHOS 54 12/11/2016 0359   BILITOT 0.4 12/11/2016 0359   GFRNONAA 52 (L) 12/13/2016 0523   GFRAA >60 12/13/2016 0523   Lipase  No results found for: LIPASE  Studies/Results: Dg Chest 2 View  Result Date: 12/12/2016 CLINICAL DATA:  History of rib fracture. EXAM: CHEST  2 VIEW COMPARISON:  CT 12/11/2016. FINDINGS: Mediastinum and hilar structures are normal. Heart size normal. Low lung volumes with mild basilar atelectasis. Stable nodular and linear opacities over the left upper lung consistent with vessel on end and adjacent scarring. Bilateral pleural-parenchymal thickening consistent with scarring. No pleural effusion or pneumothorax. IMPRESSION: No acute cardiopulmonary disease . Electronically Signed   By: Marcello Moores  Register   On: 12/12/2016 08:44      Kalman Drape , [facility] 12/13/2016, 9:24 AM Pager: [phone] Consults: [phone] Mon-Fri 7:00 am-4:30 pm Sat-Sun 7:00 am-11:30 am

## 2016-12-15 ENCOUNTER — Telehealth: Payer: Self-pay

## 2016-12-15 DIAGNOSIS — N183 Chronic kidney disease, stage 3 (moderate): Secondary | ICD-10-CM | POA: Diagnosis not present

## 2016-12-15 DIAGNOSIS — M4155 Other secondary scoliosis, thoracolumbar region: Secondary | ICD-10-CM | POA: Diagnosis not present

## 2016-12-15 DIAGNOSIS — Z9181 History of falling: Secondary | ICD-10-CM | POA: Diagnosis not present

## 2016-12-15 DIAGNOSIS — M961 Postlaminectomy syndrome, not elsewhere classified: Secondary | ICD-10-CM | POA: Diagnosis not present

## 2016-12-15 DIAGNOSIS — K5790 Diverticulosis of intestine, part unspecified, without perforation or abscess without bleeding: Secondary | ICD-10-CM | POA: Diagnosis not present

## 2016-12-15 DIAGNOSIS — S2242XD Multiple fractures of ribs, left side, subsequent encounter for fracture with routine healing: Secondary | ICD-10-CM | POA: Diagnosis not present

## 2016-12-15 DIAGNOSIS — F321 Major depressive disorder, single episode, moderate: Secondary | ICD-10-CM | POA: Diagnosis not present

## 2016-12-15 DIAGNOSIS — M4696 Unspecified inflammatory spondylopathy, lumbar region: Secondary | ICD-10-CM | POA: Diagnosis not present

## 2016-12-15 DIAGNOSIS — G4700 Insomnia, unspecified: Secondary | ICD-10-CM | POA: Diagnosis not present

## 2016-12-15 DIAGNOSIS — E039 Hypothyroidism, unspecified: Secondary | ICD-10-CM | POA: Diagnosis not present

## 2016-12-15 DIAGNOSIS — Z87891 Personal history of nicotine dependence: Secondary | ICD-10-CM | POA: Diagnosis not present

## 2016-12-15 DIAGNOSIS — N400 Enlarged prostate without lower urinary tract symptoms: Secondary | ICD-10-CM | POA: Diagnosis not present

## 2016-12-15 DIAGNOSIS — S61412D Laceration without foreign body of left hand, subsequent encounter: Secondary | ICD-10-CM | POA: Diagnosis not present

## 2016-12-15 DIAGNOSIS — J449 Chronic obstructive pulmonary disease, unspecified: Secondary | ICD-10-CM | POA: Diagnosis not present

## 2016-12-15 DIAGNOSIS — Z96641 Presence of right artificial hip joint: Secondary | ICD-10-CM | POA: Diagnosis not present

## 2016-12-15 DIAGNOSIS — I129 Hypertensive chronic kidney disease with stage 1 through stage 4 chronic kidney disease, or unspecified chronic kidney disease: Secondary | ICD-10-CM | POA: Diagnosis not present

## 2016-12-15 DIAGNOSIS — M1990 Unspecified osteoarthritis, unspecified site: Secondary | ICD-10-CM | POA: Diagnosis not present

## 2016-12-15 DIAGNOSIS — Z9981 Dependence on supplemental oxygen: Secondary | ICD-10-CM | POA: Diagnosis not present

## 2016-12-15 NOTE — Telephone Encounter (Signed)
D/C 12/13/16 To: home  Spoke with pt's caregiver, Fraser Din, and she advised that pt is doing well. He has developed a rash over his back, buttocks and down his legs. She thinks this may be from the increase in Tramadol dose. She spoke with pharmacist and they advised to only give BID/q12h as well as Benadryl to help with rash. She had made these changes but the rash is still present. She states that pt does have productive cough but only mild pain. She denies any other symptoms or complaints.   Appt scheduled with Tommi Rumps as Dr. Sherren Mocha is out of office, 12/19/16, caregiver aware.   Transition Care Management Follow-up Telephone Call  How have you been since you were released from the hospital? Improving slowly, mild pain, productive cough   Do you understand why you were in the hospital? yes   Do you understand the discharge instrcutions? yes  Items Reviewed:  Medications reviewed: yes  Allergies reviewed: yes  Dietary changes reviewed: yes  Referrals reviewed: yes   Functional Questionnaire:   Activities of Daily Living (ADLs):   He states they are independent in the following: needs assistance with most ADL's States they require assistance with the following: ambulation, bathing and hygiene, feeding, grooming, toileting and dressing   Any transportation issues/concerns?: no   Any patient concerns? no   Confirmed importance and date/time of follow-up visits scheduled: yes   Confirmed with patient if condition begins to worsen call PCP or go to the ER.  Patient was given the Call-a-Nurse line [phone]: yes

## 2016-12-19 ENCOUNTER — Ambulatory Visit: Payer: Medicare Other | Admitting: Adult Health

## 2016-12-19 ENCOUNTER — Telehealth: Payer: Self-pay | Admitting: Family Medicine

## 2016-12-19 NOTE — Telephone Encounter (Signed)
° ° °  Ronald Strong with [facility] call to ask for verbal orders for skilled nursing  2 times a week for 2 week 1 time a week 3 weeks  Cardio pulmonary assessment   And to monitor his  fracture rib   [phone]

## 2016-12-20 DIAGNOSIS — J449 Chronic obstructive pulmonary disease, unspecified: Secondary | ICD-10-CM | POA: Diagnosis not present

## 2016-12-20 DIAGNOSIS — S2242XD Multiple fractures of ribs, left side, subsequent encounter for fracture with routine healing: Secondary | ICD-10-CM | POA: Diagnosis not present

## 2016-12-20 DIAGNOSIS — S61412D Laceration without foreign body of left hand, subsequent encounter: Secondary | ICD-10-CM | POA: Diagnosis not present

## 2016-12-20 DIAGNOSIS — N183 Chronic kidney disease, stage 3 (moderate): Secondary | ICD-10-CM | POA: Diagnosis not present

## 2016-12-20 DIAGNOSIS — I129 Hypertensive chronic kidney disease with stage 1 through stage 4 chronic kidney disease, or unspecified chronic kidney disease: Secondary | ICD-10-CM | POA: Diagnosis not present

## 2016-12-20 DIAGNOSIS — M4155 Other secondary scoliosis, thoracolumbar region: Secondary | ICD-10-CM | POA: Diagnosis not present

## 2016-12-21 ENCOUNTER — Telehealth: Payer: Self-pay | Admitting: Family Medicine

## 2016-12-21 DIAGNOSIS — N183 Chronic kidney disease, stage 3 (moderate): Secondary | ICD-10-CM | POA: Diagnosis not present

## 2016-12-21 DIAGNOSIS — S61412D Laceration without foreign body of left hand, subsequent encounter: Secondary | ICD-10-CM | POA: Diagnosis not present

## 2016-12-21 DIAGNOSIS — S2242XD Multiple fractures of ribs, left side, subsequent encounter for fracture with routine healing: Secondary | ICD-10-CM | POA: Diagnosis not present

## 2016-12-21 DIAGNOSIS — J449 Chronic obstructive pulmonary disease, unspecified: Secondary | ICD-10-CM | POA: Diagnosis not present

## 2016-12-21 DIAGNOSIS — S2242XA Multiple fractures of ribs, left side, initial encounter for closed fracture: Secondary | ICD-10-CM

## 2016-12-21 DIAGNOSIS — M4155 Other secondary scoliosis, thoracolumbar region: Secondary | ICD-10-CM | POA: Diagnosis not present

## 2016-12-21 DIAGNOSIS — I129 Hypertensive chronic kidney disease with stage 1 through stage 4 chronic kidney disease, or unspecified chronic kidney disease: Secondary | ICD-10-CM | POA: Diagnosis not present

## 2016-12-21 NOTE — Telephone Encounter (Signed)
Riza with [facility] home health would like verbal orders for Home health PT 2 wk /3 For strengthening, balance and gait.  Pt also needs a 3 in 1 commode and order needs to be faxed to [facility].

## 2016-12-21 NOTE — Telephone Encounter (Signed)
Riza from [facility] is requesting for an order  for a 3 in 1 commode.A verbal order for home health PT has been provided.Please Advise. (Dr Sherren Mocha pt).

## 2016-12-21 NOTE — Telephone Encounter (Signed)
Verbal order was given to Riza from [facility] for skilled nursing 2 times a week for 2 weeks then 1 time a week for 3 weeks, cardio pulmonary assessment and to monitor his fractured rib.

## 2016-12-22 DIAGNOSIS — I129 Hypertensive chronic kidney disease with stage 1 through stage 4 chronic kidney disease, or unspecified chronic kidney disease: Secondary | ICD-10-CM | POA: Diagnosis not present

## 2016-12-22 DIAGNOSIS — M4155 Other secondary scoliosis, thoracolumbar region: Secondary | ICD-10-CM | POA: Diagnosis not present

## 2016-12-22 DIAGNOSIS — J449 Chronic obstructive pulmonary disease, unspecified: Secondary | ICD-10-CM | POA: Diagnosis not present

## 2016-12-22 DIAGNOSIS — N183 Chronic kidney disease, stage 3 (moderate): Secondary | ICD-10-CM | POA: Diagnosis not present

## 2016-12-22 DIAGNOSIS — S61412D Laceration without foreign body of left hand, subsequent encounter: Secondary | ICD-10-CM | POA: Diagnosis not present

## 2016-12-22 DIAGNOSIS — S2242XD Multiple fractures of ribs, left side, subsequent encounter for fracture with routine healing: Secondary | ICD-10-CM | POA: Diagnosis not present

## 2016-12-22 NOTE — Telephone Encounter (Signed)
okay

## 2016-12-22 NOTE — Telephone Encounter (Signed)
Order placed, await Dr. Raliegh Ip to sign order and then this will be faxed to [facility]

## 2016-12-22 NOTE — Telephone Encounter (Signed)
Ordered(referral) was signed by Dr. Raliegh Ip and given to referral coordinator to be faxed to [facility]. Nothing additional is needed.

## 2016-12-25 DIAGNOSIS — J449 Chronic obstructive pulmonary disease, unspecified: Secondary | ICD-10-CM | POA: Diagnosis not present

## 2016-12-25 DIAGNOSIS — M4155 Other secondary scoliosis, thoracolumbar region: Secondary | ICD-10-CM | POA: Diagnosis not present

## 2016-12-25 DIAGNOSIS — I129 Hypertensive chronic kidney disease with stage 1 through stage 4 chronic kidney disease, or unspecified chronic kidney disease: Secondary | ICD-10-CM | POA: Diagnosis not present

## 2016-12-25 DIAGNOSIS — S61412D Laceration without foreign body of left hand, subsequent encounter: Secondary | ICD-10-CM | POA: Diagnosis not present

## 2016-12-25 DIAGNOSIS — N183 Chronic kidney disease, stage 3 (moderate): Secondary | ICD-10-CM | POA: Diagnosis not present

## 2016-12-25 DIAGNOSIS — S2242XD Multiple fractures of ribs, left side, subsequent encounter for fracture with routine healing: Secondary | ICD-10-CM | POA: Diagnosis not present

## 2016-12-26 DIAGNOSIS — N183 Chronic kidney disease, stage 3 (moderate): Secondary | ICD-10-CM | POA: Diagnosis not present

## 2016-12-26 DIAGNOSIS — S61412D Laceration without foreign body of left hand, subsequent encounter: Secondary | ICD-10-CM | POA: Diagnosis not present

## 2016-12-26 DIAGNOSIS — J449 Chronic obstructive pulmonary disease, unspecified: Secondary | ICD-10-CM | POA: Diagnosis not present

## 2016-12-26 DIAGNOSIS — I129 Hypertensive chronic kidney disease with stage 1 through stage 4 chronic kidney disease, or unspecified chronic kidney disease: Secondary | ICD-10-CM | POA: Diagnosis not present

## 2016-12-26 DIAGNOSIS — S2242XD Multiple fractures of ribs, left side, subsequent encounter for fracture with routine healing: Secondary | ICD-10-CM | POA: Diagnosis not present

## 2016-12-26 DIAGNOSIS — M4155 Other secondary scoliosis, thoracolumbar region: Secondary | ICD-10-CM | POA: Diagnosis not present

## 2016-12-27 DIAGNOSIS — I129 Hypertensive chronic kidney disease with stage 1 through stage 4 chronic kidney disease, or unspecified chronic kidney disease: Secondary | ICD-10-CM | POA: Diagnosis not present

## 2016-12-27 DIAGNOSIS — M4155 Other secondary scoliosis, thoracolumbar region: Secondary | ICD-10-CM | POA: Diagnosis not present

## 2016-12-27 DIAGNOSIS — S61412D Laceration without foreign body of left hand, subsequent encounter: Secondary | ICD-10-CM | POA: Diagnosis not present

## 2016-12-27 DIAGNOSIS — J449 Chronic obstructive pulmonary disease, unspecified: Secondary | ICD-10-CM | POA: Diagnosis not present

## 2016-12-27 DIAGNOSIS — N183 Chronic kidney disease, stage 3 (moderate): Secondary | ICD-10-CM | POA: Diagnosis not present

## 2016-12-27 DIAGNOSIS — S2242XD Multiple fractures of ribs, left side, subsequent encounter for fracture with routine healing: Secondary | ICD-10-CM | POA: Diagnosis not present

## 2016-12-28 DIAGNOSIS — I129 Hypertensive chronic kidney disease with stage 1 through stage 4 chronic kidney disease, or unspecified chronic kidney disease: Secondary | ICD-10-CM | POA: Diagnosis not present

## 2016-12-28 DIAGNOSIS — N183 Chronic kidney disease, stage 3 (moderate): Secondary | ICD-10-CM | POA: Diagnosis not present

## 2016-12-28 DIAGNOSIS — S61412D Laceration without foreign body of left hand, subsequent encounter: Secondary | ICD-10-CM | POA: Diagnosis not present

## 2016-12-28 DIAGNOSIS — S2242XD Multiple fractures of ribs, left side, subsequent encounter for fracture with routine healing: Secondary | ICD-10-CM | POA: Diagnosis not present

## 2016-12-28 DIAGNOSIS — J449 Chronic obstructive pulmonary disease, unspecified: Secondary | ICD-10-CM | POA: Diagnosis not present

## 2016-12-28 DIAGNOSIS — M4155 Other secondary scoliosis, thoracolumbar region: Secondary | ICD-10-CM | POA: Diagnosis not present

## 2016-12-29 ENCOUNTER — Other Ambulatory Visit: Payer: Self-pay | Admitting: Family Medicine

## 2017-01-02 DIAGNOSIS — S61412D Laceration without foreign body of left hand, subsequent encounter: Secondary | ICD-10-CM | POA: Diagnosis not present

## 2017-01-02 DIAGNOSIS — S2242XD Multiple fractures of ribs, left side, subsequent encounter for fracture with routine healing: Secondary | ICD-10-CM | POA: Diagnosis not present

## 2017-01-02 DIAGNOSIS — J449 Chronic obstructive pulmonary disease, unspecified: Secondary | ICD-10-CM | POA: Diagnosis not present

## 2017-01-02 DIAGNOSIS — I129 Hypertensive chronic kidney disease with stage 1 through stage 4 chronic kidney disease, or unspecified chronic kidney disease: Secondary | ICD-10-CM | POA: Diagnosis not present

## 2017-01-02 DIAGNOSIS — M4155 Other secondary scoliosis, thoracolumbar region: Secondary | ICD-10-CM | POA: Diagnosis not present

## 2017-01-02 DIAGNOSIS — N183 Chronic kidney disease, stage 3 (moderate): Secondary | ICD-10-CM | POA: Diagnosis not present

## 2017-01-03 DIAGNOSIS — I129 Hypertensive chronic kidney disease with stage 1 through stage 4 chronic kidney disease, or unspecified chronic kidney disease: Secondary | ICD-10-CM | POA: Diagnosis not present

## 2017-01-03 DIAGNOSIS — S2242XD Multiple fractures of ribs, left side, subsequent encounter for fracture with routine healing: Secondary | ICD-10-CM | POA: Diagnosis not present

## 2017-01-03 DIAGNOSIS — S61412D Laceration without foreign body of left hand, subsequent encounter: Secondary | ICD-10-CM | POA: Diagnosis not present

## 2017-01-03 DIAGNOSIS — J449 Chronic obstructive pulmonary disease, unspecified: Secondary | ICD-10-CM | POA: Diagnosis not present

## 2017-01-03 DIAGNOSIS — M4155 Other secondary scoliosis, thoracolumbar region: Secondary | ICD-10-CM | POA: Diagnosis not present

## 2017-01-03 DIAGNOSIS — N183 Chronic kidney disease, stage 3 (moderate): Secondary | ICD-10-CM | POA: Diagnosis not present

## 2017-01-04 DIAGNOSIS — J449 Chronic obstructive pulmonary disease, unspecified: Secondary | ICD-10-CM | POA: Diagnosis not present

## 2017-01-04 DIAGNOSIS — M4155 Other secondary scoliosis, thoracolumbar region: Secondary | ICD-10-CM | POA: Diagnosis not present

## 2017-01-04 DIAGNOSIS — S61412D Laceration without foreign body of left hand, subsequent encounter: Secondary | ICD-10-CM | POA: Diagnosis not present

## 2017-01-04 DIAGNOSIS — S2242XD Multiple fractures of ribs, left side, subsequent encounter for fracture with routine healing: Secondary | ICD-10-CM | POA: Diagnosis not present

## 2017-01-04 DIAGNOSIS — N183 Chronic kidney disease, stage 3 (moderate): Secondary | ICD-10-CM | POA: Diagnosis not present

## 2017-01-04 DIAGNOSIS — I129 Hypertensive chronic kidney disease with stage 1 through stage 4 chronic kidney disease, or unspecified chronic kidney disease: Secondary | ICD-10-CM | POA: Diagnosis not present

## 2017-01-05 ENCOUNTER — Ambulatory Visit: Payer: Medicare Other | Admitting: Physical Medicine & Rehabilitation

## 2017-01-08 ENCOUNTER — Ambulatory Visit (INDEPENDENT_AMBULATORY_CARE_PROVIDER_SITE_OTHER): Payer: Medicare Other | Admitting: Family Medicine

## 2017-01-08 ENCOUNTER — Encounter: Payer: Self-pay | Admitting: Family Medicine

## 2017-01-08 VITALS — BP 102/58 | HR 74 | Temp 97.7°F

## 2017-01-08 DIAGNOSIS — Z23 Encounter for immunization: Secondary | ICD-10-CM | POA: Diagnosis not present

## 2017-01-08 DIAGNOSIS — I10 Essential (primary) hypertension: Secondary | ICD-10-CM | POA: Diagnosis not present

## 2017-01-08 DIAGNOSIS — S61412D Laceration without foreign body of left hand, subsequent encounter: Secondary | ICD-10-CM | POA: Diagnosis not present

## 2017-01-08 DIAGNOSIS — J439 Emphysema, unspecified: Secondary | ICD-10-CM

## 2017-01-08 DIAGNOSIS — N183 Chronic kidney disease, stage 3 (moderate): Secondary | ICD-10-CM | POA: Diagnosis not present

## 2017-01-08 DIAGNOSIS — J9621 Acute and chronic respiratory failure with hypoxia: Secondary | ICD-10-CM | POA: Diagnosis not present

## 2017-01-08 DIAGNOSIS — J449 Chronic obstructive pulmonary disease, unspecified: Secondary | ICD-10-CM | POA: Diagnosis not present

## 2017-01-08 DIAGNOSIS — I129 Hypertensive chronic kidney disease with stage 1 through stage 4 chronic kidney disease, or unspecified chronic kidney disease: Secondary | ICD-10-CM | POA: Diagnosis not present

## 2017-01-08 DIAGNOSIS — S2242XD Multiple fractures of ribs, left side, subsequent encounter for fracture with routine healing: Secondary | ICD-10-CM | POA: Diagnosis not present

## 2017-01-08 DIAGNOSIS — M4155 Other secondary scoliosis, thoracolumbar region: Secondary | ICD-10-CM | POA: Diagnosis not present

## 2017-01-08 NOTE — Patient Instructions (Signed)
Stop the verapamil  Check a blood pressure and pulse Monday Wednesday Friday  Return in one month for follow-up sooner if any problems

## 2017-01-08 NOTE — Progress Notes (Signed)
Ronald Strong is a 83 year old recently widowed male........ his wife died in 2016/07/05........ who comes in today accompanied by his caregiver Ponciano Ort.... For follow-up of his COPD and multiple medical problems.  We've decreased his verapamil 220 mg Monday Wednesday Friday because his blood pressure has been dropping. It's currently running 102/58 with a pulse of 74.  He is on O2 because of his underlying COPD. Oxygen at rest is 94 however with any exertion his oxygen drops in the low 70s. He declines to use his oxygen consistent basis. We'll try to convince him to use it with exercise and at night.  We also decreased his Flomax to Monday Wednesday Friday because of the hypotension as noted above. His urinary habits are normal.  Fraser Din is staying with him now because [name] is traveling in [place].  His weight is up 3 pounds his appetite spider his moods better. We tried to get him to go to the hospice counseling program however he still declines.  BP (!) 102/58 (BP Location: Left Arm, Patient Position: Sitting, Cuff Size: Normal)   Pulse 74   Temp 97.7 F (36.5 C) (Oral)   SpO2 94%  General he is well-developed well-nourished male no acute distress cardiopulmonary exam normal except for heart sounds extremely distant with consistent with a COPD.

## 2017-01-10 DIAGNOSIS — J449 Chronic obstructive pulmonary disease, unspecified: Secondary | ICD-10-CM | POA: Diagnosis not present

## 2017-01-10 DIAGNOSIS — M4155 Other secondary scoliosis, thoracolumbar region: Secondary | ICD-10-CM | POA: Diagnosis not present

## 2017-01-10 DIAGNOSIS — S61412D Laceration without foreign body of left hand, subsequent encounter: Secondary | ICD-10-CM | POA: Diagnosis not present

## 2017-01-10 DIAGNOSIS — S2242XD Multiple fractures of ribs, left side, subsequent encounter for fracture with routine healing: Secondary | ICD-10-CM | POA: Diagnosis not present

## 2017-01-10 DIAGNOSIS — I129 Hypertensive chronic kidney disease with stage 1 through stage 4 chronic kidney disease, or unspecified chronic kidney disease: Secondary | ICD-10-CM | POA: Diagnosis not present

## 2017-01-10 DIAGNOSIS — N183 Chronic kidney disease, stage 3 (moderate): Secondary | ICD-10-CM | POA: Diagnosis not present

## 2017-01-11 DIAGNOSIS — M4155 Other secondary scoliosis, thoracolumbar region: Secondary | ICD-10-CM | POA: Diagnosis not present

## 2017-01-11 DIAGNOSIS — I129 Hypertensive chronic kidney disease with stage 1 through stage 4 chronic kidney disease, or unspecified chronic kidney disease: Secondary | ICD-10-CM | POA: Diagnosis not present

## 2017-01-11 DIAGNOSIS — N183 Chronic kidney disease, stage 3 (moderate): Secondary | ICD-10-CM | POA: Diagnosis not present

## 2017-01-11 DIAGNOSIS — S61412D Laceration without foreign body of left hand, subsequent encounter: Secondary | ICD-10-CM | POA: Diagnosis not present

## 2017-01-11 DIAGNOSIS — J449 Chronic obstructive pulmonary disease, unspecified: Secondary | ICD-10-CM | POA: Diagnosis not present

## 2017-01-11 DIAGNOSIS — S2242XD Multiple fractures of ribs, left side, subsequent encounter for fracture with routine healing: Secondary | ICD-10-CM | POA: Diagnosis not present

## 2017-01-12 ENCOUNTER — Telehealth: Payer: Self-pay

## 2017-01-12 NOTE — Telephone Encounter (Signed)
Oxygen Saturation forms for [employer] Oxygen Concentrator order from [employer] [facility] were completed and faxed back this morning.

## 2017-01-12 NOTE — Progress Notes (Signed)
Pt Oxygen Saturations are at 96% at rest with 2 lit, without oxygen his saturations are at 86% while ambulating.

## 2017-01-15 DIAGNOSIS — S2242XD Multiple fractures of ribs, left side, subsequent encounter for fracture with routine healing: Secondary | ICD-10-CM | POA: Diagnosis not present

## 2017-01-15 DIAGNOSIS — N183 Chronic kidney disease, stage 3 (moderate): Secondary | ICD-10-CM | POA: Diagnosis not present

## 2017-01-15 DIAGNOSIS — I129 Hypertensive chronic kidney disease with stage 1 through stage 4 chronic kidney disease, or unspecified chronic kidney disease: Secondary | ICD-10-CM | POA: Diagnosis not present

## 2017-01-15 DIAGNOSIS — J449 Chronic obstructive pulmonary disease, unspecified: Secondary | ICD-10-CM | POA: Diagnosis not present

## 2017-01-15 DIAGNOSIS — M4155 Other secondary scoliosis, thoracolumbar region: Secondary | ICD-10-CM | POA: Diagnosis not present

## 2017-01-15 DIAGNOSIS — S61412D Laceration without foreign body of left hand, subsequent encounter: Secondary | ICD-10-CM | POA: Diagnosis not present

## 2017-01-19 ENCOUNTER — Encounter: Payer: Self-pay | Admitting: Family Medicine

## 2017-01-25 DIAGNOSIS — M4155 Other secondary scoliosis, thoracolumbar region: Secondary | ICD-10-CM | POA: Diagnosis not present

## 2017-01-25 DIAGNOSIS — S61412D Laceration without foreign body of left hand, subsequent encounter: Secondary | ICD-10-CM | POA: Diagnosis not present

## 2017-01-25 DIAGNOSIS — N183 Chronic kidney disease, stage 3 (moderate): Secondary | ICD-10-CM | POA: Diagnosis not present

## 2017-01-25 DIAGNOSIS — J449 Chronic obstructive pulmonary disease, unspecified: Secondary | ICD-10-CM | POA: Diagnosis not present

## 2017-01-25 DIAGNOSIS — S2242XD Multiple fractures of ribs, left side, subsequent encounter for fracture with routine healing: Secondary | ICD-10-CM | POA: Diagnosis not present

## 2017-01-25 DIAGNOSIS — I129 Hypertensive chronic kidney disease with stage 1 through stage 4 chronic kidney disease, or unspecified chronic kidney disease: Secondary | ICD-10-CM | POA: Diagnosis not present

## 2017-01-31 ENCOUNTER — Telehealth: Payer: Self-pay

## 2017-01-31 NOTE — Telephone Encounter (Signed)
Received Oxygen Certificate of medical necessity form to be filled, Form was filled and faxed back to [facility]. Spoke with [facility] and verified that the information was correct.

## 2017-02-07 ENCOUNTER — Telehealth: Payer: Self-pay | Admitting: Family Medicine

## 2017-02-07 ENCOUNTER — Ambulatory Visit (INDEPENDENT_AMBULATORY_CARE_PROVIDER_SITE_OTHER): Payer: Medicare Other | Admitting: Family Medicine

## 2017-02-07 ENCOUNTER — Encounter: Payer: Self-pay | Admitting: Family Medicine

## 2017-02-07 VITALS — BP 108/60 | HR 82 | Temp 97.9°F | Wt 142.0 lb

## 2017-02-07 DIAGNOSIS — I10 Essential (primary) hypertension: Secondary | ICD-10-CM

## 2017-02-07 DIAGNOSIS — J439 Emphysema, unspecified: Secondary | ICD-10-CM | POA: Diagnosis not present

## 2017-02-07 NOTE — Telephone Encounter (Signed)
Pt is needing for our office to fax [phone] (F) over to [place] that the pt has been taken off of oxygen.

## 2017-02-07 NOTE — Progress Notes (Signed)
Ronald Strong is a 83 year old widowed male,,,,,,, his wife died last spring,,,,, who comes in today compared by his caregiver Ronald Strong for evaluation of multiple medical problems  He has a history of BPH urinary L obstruction. We decrease his Flomax to 0.4 Monday Wednesday Friday because he was having syncope and had broken a couple ribs and in up the hospital. Now has to get up to 3 times at night. BP here today 108/60. He says he is not lightheaded when he stands up  He has a history of congestive heart failure. He's currently off all his medications. The Ronald Strong was discontinued because of a syncopal episode. Blood pressure remains normal off the Ronald Strong his heart rate is in the 80s with an occasional skip.  He stopped using his oxygen completely.  Vaccinations up-to-date  BP 108/60 (BP Location: Left Arm, Patient Position: Sitting, Cuff Size: Normal)   Pulse 82   Temp 97.9 F (36.6 C) (Oral)   Wt 142 lb (64.4 kg)   SpO2 94%   BMI 21.59 kg/m  Ronald Strong wheeze well-developed well-nourished male no acute distress examination of the heart and lungs is normal except for occasional skip  #1 BPH with outlet obstruction......... continue Flomax Monday Wednesday Friday  #2 congestive heart failure...Marland KitchenMarland KitchenMarland Kitchen stable off all medications...Marland KitchenMarland KitchenMarland Kitchen no longer O2 dependent..... Pulse ox 92 on room air...... and he no longer decompensates when he walks,

## 2017-02-07 NOTE — Patient Instructions (Signed)
Stop the oxygen completely  Continue current medications  Follow-up in 2 months,,,,,,,,,,,, sooner if any problems

## 2017-02-13 ENCOUNTER — Telehealth: Payer: Self-pay

## 2017-02-13 NOTE — Telephone Encounter (Signed)
Spoke with [facility] this morning and notified them that pt is no longer using his Oxygen.

## 2017-02-14 ENCOUNTER — Telehealth: Payer: Self-pay | Admitting: Family Medicine

## 2017-02-14 NOTE — Telephone Encounter (Signed)
Latoya with [facility] calling to ask if you received the fax for order that needs to be signed for home health. If so, please fax back as soon as you can

## 2017-02-16 NOTE — Telephone Encounter (Signed)
Spoke with [facility] in regards to the [place] forms that dr Sherren Mocha needs to fill, left a message for Latoya to return my call in the office.

## 2017-02-23 NOTE — Telephone Encounter (Signed)
Pt forms have been completed signed and faxed back to [place], 02/23/2017

## 2017-02-25 ENCOUNTER — Other Ambulatory Visit: Payer: Self-pay | Admitting: Family Medicine

## 2017-03-16 NOTE — Telephone Encounter (Signed)
Called and spoke with [facility] in regards to pt not using the oxygen,stated that they noted the message on pt file.

## 2017-03-26 ENCOUNTER — Other Ambulatory Visit: Payer: Self-pay | Admitting: Family Medicine

## 2017-03-26 ENCOUNTER — Telehealth: Payer: Self-pay

## 2017-03-26 NOTE — Telephone Encounter (Signed)
Pt Rx for Ambien was phoned into pt pharmacy for refill.

## 2017-04-04 ENCOUNTER — Emergency Department (HOSPITAL_COMMUNITY): Payer: Medicare Other

## 2017-04-04 ENCOUNTER — Emergency Department (HOSPITAL_COMMUNITY)
Admission: EM | Admit: 2017-04-04 | Discharge: 2017-04-04 | Disposition: A | Discharge status: Home / Self Care | Payer: Medicare Other | Attending: Emergency Medicine | Admitting: Emergency Medicine

## 2017-04-04 ENCOUNTER — Other Ambulatory Visit: Payer: Self-pay

## 2017-04-04 ENCOUNTER — Encounter (HOSPITAL_COMMUNITY): Payer: Self-pay | Admitting: Nurse Practitioner

## 2017-04-04 DIAGNOSIS — R079 Chest pain, unspecified: Secondary | ICD-10-CM | POA: Diagnosis not present

## 2017-04-04 DIAGNOSIS — W19XXXA Unspecified fall, initial encounter: Secondary | ICD-10-CM | POA: Diagnosis not present

## 2017-04-04 DIAGNOSIS — R51 Headache: Secondary | ICD-10-CM | POA: Diagnosis not present

## 2017-04-04 DIAGNOSIS — Z87891 Personal history of nicotine dependence: Secondary | ICD-10-CM | POA: Diagnosis not present

## 2017-04-04 DIAGNOSIS — Z96641 Presence of right artificial hip joint: Secondary | ICD-10-CM | POA: Diagnosis not present

## 2017-04-04 DIAGNOSIS — J449 Chronic obstructive pulmonary disease, unspecified: Secondary | ICD-10-CM | POA: Insufficient documentation

## 2017-04-04 DIAGNOSIS — M545 Low back pain: Secondary | ICD-10-CM | POA: Diagnosis not present

## 2017-04-04 DIAGNOSIS — S3992XA Unspecified injury of lower back, initial encounter: Secondary | ICD-10-CM | POA: Diagnosis not present

## 2017-04-04 DIAGNOSIS — I10 Essential (primary) hypertension: Secondary | ICD-10-CM | POA: Insufficient documentation

## 2017-04-04 DIAGNOSIS — S79911A Unspecified injury of right hip, initial encounter: Secondary | ICD-10-CM | POA: Diagnosis not present

## 2017-04-04 DIAGNOSIS — S4991XA Unspecified injury of right shoulder and upper arm, initial encounter: Secondary | ICD-10-CM | POA: Diagnosis not present

## 2017-04-04 DIAGNOSIS — M25551 Pain in right hip: Secondary | ICD-10-CM | POA: Diagnosis not present

## 2017-04-04 DIAGNOSIS — Z7982 Long term (current) use of aspirin: Secondary | ICD-10-CM | POA: Insufficient documentation

## 2017-04-04 DIAGNOSIS — M25511 Pain in right shoulder: Secondary | ICD-10-CM | POA: Insufficient documentation

## 2017-04-04 DIAGNOSIS — R0902 Hypoxemia: Secondary | ICD-10-CM | POA: Diagnosis not present

## 2017-04-04 DIAGNOSIS — M542 Cervicalgia: Secondary | ICD-10-CM | POA: Diagnosis not present

## 2017-04-04 DIAGNOSIS — G8929 Other chronic pain: Secondary | ICD-10-CM

## 2017-04-04 DIAGNOSIS — Y939 Activity, unspecified: Secondary | ICD-10-CM | POA: Insufficient documentation

## 2017-04-04 DIAGNOSIS — Y999 Unspecified external cause status: Secondary | ICD-10-CM | POA: Insufficient documentation

## 2017-04-04 DIAGNOSIS — E039 Hypothyroidism, unspecified: Secondary | ICD-10-CM | POA: Diagnosis not present

## 2017-04-04 DIAGNOSIS — M25519 Pain in unspecified shoulder: Secondary | ICD-10-CM | POA: Diagnosis not present

## 2017-04-04 DIAGNOSIS — M79621 Pain in right upper arm: Secondary | ICD-10-CM | POA: Diagnosis not present

## 2017-04-04 DIAGNOSIS — S199XXA Unspecified injury of neck, initial encounter: Secondary | ICD-10-CM | POA: Diagnosis not present

## 2017-04-04 DIAGNOSIS — S0990XA Unspecified injury of head, initial encounter: Secondary | ICD-10-CM | POA: Diagnosis not present

## 2017-04-04 DIAGNOSIS — M546 Pain in thoracic spine: Secondary | ICD-10-CM | POA: Diagnosis not present

## 2017-04-04 DIAGNOSIS — Y92009 Unspecified place in unspecified non-institutional (private) residence as the place of occurrence of the external cause: Secondary | ICD-10-CM | POA: Insufficient documentation

## 2017-04-04 DIAGNOSIS — S299XXA Unspecified injury of thorax, initial encounter: Secondary | ICD-10-CM | POA: Diagnosis not present

## 2017-04-04 MED ORDER — ONDANSETRON HCL 4 MG/2ML IJ SOLN
4.0000 mg | Freq: Once | INTRAMUSCULAR | Status: AC
  Administered 2017-04-04: 4 mg via INTRAVENOUS
  Filled 2017-04-04: qty 2

## 2017-04-04 MED ORDER — LIDOCAINE 5 % EX OINT: 1.0000 "application " | TOPICAL_OINTMENT | CUTANEOUS | 0 refills | Status: DC | PRN

## 2017-04-04 MED ORDER — FENTANYL CITRATE (PF) 100 MCG/2ML IJ SOLN
50.0000 ug | Freq: Once | INTRAMUSCULAR | Status: AC
  Administered 2017-04-04: 50 ug via INTRAVENOUS
  Filled 2017-04-04: qty 2

## 2017-04-04 MED ORDER — ACETAMINOPHEN 325 MG PO TABS
650.0000 mg | ORAL_TABLET | Freq: Once | ORAL | Status: AC
  Administered 2017-04-04: 650 mg via ORAL
  Filled 2017-04-04: qty 2

## 2017-04-04 NOTE — Discharge Instructions (Signed)
You can take 650 mg of Tylenol every 6 hours for pain control. You can also apply lidocaine ointment over areas that are sore. You might be sore in these areas for a few days since you fell on your right side.  If your pain does not start to improve in the next 3-5 days, please follow-up with your primary care provider.  He can also apply ice or heat for 15-20 minutes up to 3-4 times a day to areas that are sore.  You X-rays and CT did not show any fractures/broken bones.  If you develop new or worsening symptoms, including uncontrollable pain, and new fall or injury, or other new concerning symptoms, please return to the emergency department for re-evaluation.

## 2017-04-04 NOTE — ED Notes (Signed)
Patient walked with the walker with no problems

## 2017-04-04 NOTE — ED Notes (Signed)
Bed: [facility] Expected date:  Expected time:  Means of arrival:  Comments: EMS Fall 

## 2017-04-04 NOTE — ED Provider Notes (Signed)
[facility] DEPT Provider Note   CSN: [id number] Arrival date & time: 04/04/17  1239     History   Chief Complaint No chief complaint on file.   HPI Ronald Strong is a 83 y.o. male who presents to the emergency department with a chief complaint of unwitnessed fall onto his right side that occurred at approximately 9 AM this morning.  He reports that he fell from a standing position onto a hardwood floor while he was walking through his kitchen to go feed his dog.  He denies dizziness, presyncopal symptoms, lightheadedness, or weakness prior to the fall.  Neither the caregiver or the patient are unable to state how long the patient was on the floor but they estimate the time at 30 minutes.  In the emergency department, he complains of right shoulder and right hip pain. He has been unable to bear weight on his right leg since the fall.  No treatment prior to arrival.  His caregiver reports a history of orthostatic hypotension for which he is followed by Dr. Sherren Mocha, his PCP. On 10/10, he was seen by Dr. Sherren Mocha who reports that his orthostatic HTN improved after his Flomax was decreased to 0.4 M/W/F.   Past medical history includes HTN, COPD on 2L of HO2 at night, CHF, chronic radicular low back pain, postlaminectomy syndrome in the lumbar region, and footdrop.   He was most recently discharged from the hospital on 12/13/2016 after a fall where he sustained fractures of the left posterior ninth and tenth ribs.   He lives alone at home.  He has a caregiver present from 9 in the morning until 10 PM at night daily.  The history is provided by the patient and a caregiver. No language interpreter was used.    Past Medical History:  Diagnosis Date  . Allergy   . Arrhythmia   . Arthritis   . Chicken pox   . Colon polyps   . Depression   . Diverticulitis   . Hypertension   . Thyroid disease     Patient Active Problem List   Diagnosis Date Noted  . COPD  (chronic obstructive pulmonary disease) ([place]) 12/13/2016  . Acute on chronic respiratory failure with hypoxia ([place]) 12/13/2016  . Essential hypertension 12/11/2016  . Depression, major, single episode, moderate ([place]) 08/08/2016  . Footdrop 10/05/2015  . Postlaminectomy syndrome, lumbar region 01/09/2014  . Other secondary scoliosis, thoracolumbar region 01/09/2014  . Chronic radicular low back pain 06/03/2013  . Hypothyroidism 06/03/2013  . Insomnia 06/03/2013  . BPH without urinary obstruction 06/03/2013    Past Surgical History:  Procedure Laterality Date  . OTHER SURGICAL HISTORY  09/01/10   spine decompression  . PROSTATE SURGERY  01/28/13   laser  . SPINE SURGERY  09/11/10  . TOTAL HIP ARTHROPLASTY  09/21/06   right       Home Medications    Prior to Admission medications   Medication Sig Start Date End Date Taking? Authorizing Provider  aspirin 325 MG EC tablet Take 325 mg by mouth 2 (two) times daily.    [provider]  citalopram (CELEXA) 20 MG tablet TAKE 1 TABLET DAILY 02/26/17   Dorena Cookey, MD  diphenhydrAMINE (BENADRYL) 25 mg capsule Take 25 mg by mouth daily.    [provider]  lidocaine (XYLOCAINE) 5 % ointment Apply 1 application topically as needed. 04/04/17   Rasheen Bells A, PA-C  Multiple Vitamin (MULTIVITAMIN WITH MINERALS) TABS tablet Take 1 tablet by  mouth daily.    [provider]  SYNTHROID 137 MCG tablet TAKE 1 TABLET (137 MCG TOTAL) BY MOUTH DAILY BEFORE BREAKFAST. 12/29/16   Burchette, Alinda Sierras, MD  tamsulosin (FLOMAX) 0.4 MG CAPS capsule TAKE 1 CAPSULE (0.4 MG TOTAL) BY MOUTH EVERY MORNING. Patient taking differently: Take 0.4 mg by mouth every morning. TAKING MON, WED AND FRIDAY 06/05/16   Dorena Cookey, MD  zolpidem (AMBIEN) 5 MG tablet TAKE 1 TABLET BY MOUTH AT BEDTIME 03/26/17   Dorena Cookey, MD    Family History Family History  Problem Relation Age of Onset  . Arthritis Other   . Heart disease Other   .  Hypertension Other     Social History Social History   Tobacco Use  . Smoking status: Former Smoker    Last attempt to quit: 06/04/1983    Years since quitting: 33.8  . Smokeless tobacco: Never Used  Substance Use Topics  . Alcohol use: Yes  . Drug use: No     Allergies   Bee venom; Epinephrine; and Pollen extract   Review of Systems Review of Systems  Eyes: Negative for visual disturbance.  Respiratory: Negative for shortness of breath.   Cardiovascular: Negative for chest pain.  Gastrointestinal: Negative for abdominal pain.  Genitourinary: Negative for flank pain.  Musculoskeletal: Positive for arthralgias, gait problem, joint swelling and myalgias. Negative for back pain and neck pain.  Skin: Negative for wound.  Neurological: Negative for dizziness, syncope, weakness and headaches.  Psychiatric/Behavioral: Negative for confusion.   Physical Exam Updated Vital Signs BP 126/64 (BP Location: Right Arm)   Pulse 68   Temp 98.5 F (36.9 C) (Oral)   Resp 18   Ht 5\' 10"  (1.778 m)   Wt 68 kg (150 lb)   SpO2 99%   BMI 21.52 kg/m   Physical Exam  Constitutional: He appears well-developed.  HENT:  Head: Normocephalic and atraumatic. Head is without raccoon's eyes and without Battle's sign.  No hematoma.  Eyes: Conjunctivae and EOM are normal. Pupils are equal, round, and reactive to light.  Neck: Neck supple.  Cardiovascular: Normal rate, regular rhythm, normal heart sounds and intact distal pulses. Exam reveals no gallop and no friction rub.  No murmur heard. Pulmonary/Chest: Effort normal. No stridor. No respiratory distress. He has wheezes. He has no rales. He exhibits no tenderness.  Mild wheezes heard in the bilateral bases with expiration.   Abdominal: Soft. Bowel sounds are normal. He exhibits no distension and no mass. There is no tenderness. There is no rebound and no guarding. No hernia.  Musculoskeletal: He exhibits tenderness.  Head-to-toe physical exam  was performed.No hematoma, bleeding of the scalp, no facial abrasions, no spine step offs, crepitus of the chest or neck, no gross deformities, or no chest tenderness.  Diffuse tenderness to palpation over the right posterior and superior shoulder.  No overlying contusion.  5 out of 5 strength against resistance of bilateral upper extremities.  Sensation is intact throughout.  Radial pulses are 2+ and symmetric.  Tender to palpation over the right lateral hip.  No extremity shortening.  DP and PT pulses are 2+ and symmetric.  Decreased strength against resistance with dorsiflexion plantarflexion on the right secondary to pain.  Decreased range of motion of the right hip secondary to pain.  Sensation is intact throughout.  Neurological: He is alert.  Skin: Skin is warm and dry. Capillary refill takes less than 2 seconds.  Psychiatric: His behavior is normal.  Nursing  note and vitals reviewed.    ED Treatments / Results  Labs (all labs ordered are listed, but only abnormal results are displayed) Labs Reviewed - No data to display  EKG  EKG Interpretation None       Radiology Dg Chest 2 View  Result Date: 04/04/2017 CLINICAL DATA:  83 year old male status post fall onto right side with pain. Wheezing. EXAM: CHEST  2 VIEW COMPARISON:  Chest radiographs 12/13/2016 and earlier. FINDINGS: Semi upright AP and lateral views of the chest. Stable lung volumes. Stable cardiac size and mediastinal contours. Calcified aortic atherosclerosis. Visualized tracheal air column is within normal limits. No pneumothorax, pulmonary edema or pleural effusion. Basilar predominant reticulonodular opacity re- demonstrated at the lung bases an greater on the left. No acute pulmonary opacity identified. Stable visualized osseous structures. IMPRESSION: 1. No acute cardiopulmonary abnormality or acute traumatic injury identified. 2. Chronic basilar interstitial lung disease. 3.  Aortic Atherosclerosis (ICD10-I70.0).  Electronically Signed   By: Genevie Ann M.D.   On: 04/04/2017 14:54   Dg Thoracic Spine 2 View  Result Date: 04/04/2017 CLINICAL DATA:  83 year old male status post fall onto right side with pain. Wheezing. EXAM: THORACIC SPINE 2 VIEWS COMPARISON:  Chest radiographs today.  Chest CT 12/11/2016. FINDINGS: Normal thoracic segmentation. Mild dextroconvex thoracic spine curvature. Flowing osteophytes throughout the thoracic spine with multilevel ankylosis. Osteopenia. Thoracic vertebral height and alignment appears stable. Calcified aortic atherosclerosis. IMPRESSION: No acute osseous abnormality identified in the thoracic spine. Electronically Signed   By: Genevie Ann M.D.   On: 04/04/2017 14:58   Dg Lumbar Spine Complete  Result Date: 04/04/2017 CLINICAL DATA:  83 year old male status post fall onto right side with pain. Wheezing. EXAM: LUMBAR SPINE - COMPLETE 4+ VIEW COMPARISON:  Thoracic spine series today reported separately. FINDINGS: Normal lumbar segmentation. Straightening and mild reversal of lumbar lordosis. Diffuse lumbar disc space loss and bulky, sclerotic endplate osteophytosis. Vacuum disc at L5-S1. Visible lower thoracic and lumbar levels appear intact. Grossly intact sacrum. Osteopenia in the pelvis. Partially visible right hip arthroplasty. Aortoiliac calcified atherosclerosis. IMPRESSION: 1. No acute osseous abnormality identified in the lumbar spine. 2. Diffuse advanced lumbar disc and endplate degeneration. Electronically Signed   By: Genevie Ann M.D.   On: 04/04/2017 15:00   Dg Shoulder Right  Result Date: 04/04/2017 CLINICAL DATA:  83 year old male status post fall onto right side with pain. Wheezing. EXAM: RIGHT SHOULDER - 2+ VIEW COMPARISON:  Chest radiographs 12/12/2016 and chest CT 12/11/2016. FINDINGS: No glenohumeral joint dislocation. Intact proximal right humerus. The right clavicle and scapula appear intact. Visible right ribs appear intact. Stable visible lung parenchyma. IMPRESSION: No  acute fracture or dislocation identified about the right shoulder. Electronically Signed   By: Genevie Ann M.D.   On: 04/04/2017 14:55   Ct Head Wo Contrast  Result Date: 04/04/2017 CLINICAL DATA:  Unwitnessed fall with pain EXAM: CT HEAD WITHOUT CONTRAST CT CERVICAL SPINE WITHOUT CONTRAST TECHNIQUE: Multidetector CT imaging of the head and cervical spine was performed following the standard protocol without intravenous contrast. Multiplanar CT image reconstructions of the cervical spine were also generated. COMPARISON:  Head CT December 11, 2016 FINDINGS: CT HEAD FINDINGS Brain: There is moderate diffuse atrophy. There is no intracranial mass, hemorrhage, extra-axial fluid collection, or midline shift. There is small vessel disease throughout the centra semiovale bilaterally, unchanged. No new gray-white compartment lesion. No evident acute infarct. Vascular: No hyperdense vessel appreciable. There is calcification in both carotid siphon regions with scattered calcification in  each middle cerebral artery. Skull: Bony calvarium appears intact. Sinuses/Orbits: There is mucosal thickening in several ethmoid air cells bilaterally. Paranasal sinuses elsewhere are clear. There is a deviated nasal septum toward the left. Orbits appear symmetric bilaterally. Other: Mastoid air cells are clear. CT CERVICAL SPINE FINDINGS Alignment: There is 3 mm of anterolisthesis of C7 on T1. There is 2 mm of anterolisthesis of T1 on T2. There is 2 mm of anterolisthesis of T2 on T3. No other spondylolisthesis is evident. Skull base and vertebrae: Skull base and craniocervical junction regions appear normal. There is extensive cystic change in the odontoid. There is no acute fracture evident. There are no blastic or lytic bone lesions. Soft tissues and spinal canal: The prevertebral soft tissues and predental space regions are normal. There is no paraspinous lesion. There is no evident cord or canal hematoma. Disc levels: There is severe disc  space narrowing at C3-4, C4-5, C5-6, and C6-7. There is moderate to moderately severe disc space narrowing at all other levels in the cervical and upper thoracic regions. There is multifocal facet osteoarthritic change with exit foraminal narrowing at multiple levels. These changes are most notable at C2-3 on the right, at C3-4 on the right, at C4-5 on the right, into a slightly lesser extent at C5-6 on the right. There is impression on exiting nerve roots at these levels. There is no frank disc extrusion or high-grade stenosis. Upper chest: Visualized upper lung zones are clear. Other: There is bilateral carotid artery calcification. IMPRESSION: CT head: Stable atrophy with periventricular small vessel disease. No evident acute infarct. No mass or hemorrhage. There are foci of arteriovascular calcification. There is mucosal thickening in several ethmoid air cells. Nasal septum is deviated toward the left. CT cervical spine: No evident fracture. Areas of mild spondylolisthesis in the lower cervical and upper thoracic regions are felt to be due to underlying spondylosis. There is extensive osteoarthritic change throughout the cervical and upper thoracic spine. There is impression on exiting nerve roots due to bony hypertrophy at several levels on the right side. No frank disc extrusion or stenosis. There is carotid artery calcification bilaterally. Electronically Signed   By: Lowella Grip III M.D.   On: 04/04/2017 14:23   Ct Cervical Spine Wo Contrast  Result Date: 04/04/2017 CLINICAL DATA:  Unwitnessed fall with pain EXAM: CT HEAD WITHOUT CONTRAST CT CERVICAL SPINE WITHOUT CONTRAST TECHNIQUE: Multidetector CT imaging of the head and cervical spine was performed following the standard protocol without intravenous contrast. Multiplanar CT image reconstructions of the cervical spine were also generated. COMPARISON:  Head CT December 11, 2016 FINDINGS: CT HEAD FINDINGS Brain: There is moderate diffuse atrophy.  There is no intracranial mass, hemorrhage, extra-axial fluid collection, or midline shift. There is small vessel disease throughout the centra semiovale bilaterally, unchanged. No new gray-white compartment lesion. No evident acute infarct. Vascular: No hyperdense vessel appreciable. There is calcification in both carotid siphon regions with scattered calcification in each middle cerebral artery. Skull: Bony calvarium appears intact. Sinuses/Orbits: There is mucosal thickening in several ethmoid air cells bilaterally. Paranasal sinuses elsewhere are clear. There is a deviated nasal septum toward the left. Orbits appear symmetric bilaterally. Other: Mastoid air cells are clear. CT CERVICAL SPINE FINDINGS Alignment: There is 3 mm of anterolisthesis of C7 on T1. There is 2 mm of anterolisthesis of T1 on T2. There is 2 mm of anterolisthesis of T2 on T3. No other spondylolisthesis is evident. Skull base and vertebrae: Skull base and craniocervical junction regions  appear normal. There is extensive cystic change in the odontoid. There is no acute fracture evident. There are no blastic or lytic bone lesions. Soft tissues and spinal canal: The prevertebral soft tissues and predental space regions are normal. There is no paraspinous lesion. There is no evident cord or canal hematoma. Disc levels: There is severe disc space narrowing at C3-4, C4-5, C5-6, and C6-7. There is moderate to moderately severe disc space narrowing at all other levels in the cervical and upper thoracic regions. There is multifocal facet osteoarthritic change with exit foraminal narrowing at multiple levels. These changes are most notable at C2-3 on the right, at C3-4 on the right, at C4-5 on the right, into a slightly lesser extent at C5-6 on the right. There is impression on exiting nerve roots at these levels. There is no frank disc extrusion or high-grade stenosis. Upper chest: Visualized upper lung zones are clear. Other: There is bilateral  carotid artery calcification. IMPRESSION: CT head: Stable atrophy with periventricular small vessel disease. No evident acute infarct. No mass or hemorrhage. There are foci of arteriovascular calcification. There is mucosal thickening in several ethmoid air cells. Nasal septum is deviated toward the left. CT cervical spine: No evident fracture. Areas of mild spondylolisthesis in the lower cervical and upper thoracic regions are felt to be due to underlying spondylosis. There is extensive osteoarthritic change throughout the cervical and upper thoracic spine. There is impression on exiting nerve roots due to bony hypertrophy at several levels on the right side. No frank disc extrusion or stenosis. There is carotid artery calcification bilaterally. Electronically Signed   By: Lowella Grip III M.D.   On: 04/04/2017 14:23   Dg Humerus Right  Result Date: 04/04/2017 CLINICAL DATA:  83 year old male status post fall onto right side with pain. Wheezing. EXAM: RIGHT HUMERUS - 2+ VIEW COMPARISON:  Right shoulder series today. FINDINGS: Intact right humerus. Alignment at the right shoulder and elbow appears preserved. Stable visible right ribs and lung parenchyma. IMPRESSION: No acute fracture or dislocation identified about the right humerus. Electronically Signed   By: Genevie Ann M.D.   On: 04/04/2017 14:56   Dg Hip Unilat W Or Wo Pelvis 2-3 Views Right  Result Date: 04/04/2017 CLINICAL DATA:  [place] on right side. Pain and limited range of motion. Injury today. EXAM: DG HIP (WITH OR WITHOUT PELVIS) 2-3V RIGHT COMPARISON:  None. FINDINGS: Previous total hip replacement on the right. No sign of fracture or of postoperative complication. Other bones of the pelvis. Negative other than some sacroiliac osteoarthritis and left hip osteoarthritis. IMPRESSION: No acute traumatic finding.  Previous hip replacement on the right. Electronically Signed   By: Nelson Chimes M.D.   On: 04/04/2017 15:04    Procedures Procedures  (including critical care time)  Medications Ordered in ED Medications  fentaNYL (SUBLIMAZE) injection 50 mcg (50 mcg Intravenous Given 04/04/17 1349)  ondansetron (ZOFRAN) injection 4 mg (4 mg Intravenous Given 04/04/17 1349)  acetaminophen (TYLENOL) tablet 650 mg (650 mg Oral Given 04/04/17 1541)     Initial Impression / Assessment and Plan / ED Course  I have reviewed the triage vital signs and the nursing notes.  Pertinent labs & imaging results that were available during my care of the patient were reviewed by me and considered in my medical decision making (see chart for details).     83 year old male presenting with right hip and shoulder pain secondary to an unwitnessed fall this morning.  The patient was seen and  evaluated with Dr. Leonette Monarch, attending physician.  Head CT and cervical spine CT are negative for ICH, subdural, or epidural hematoma or fracture.  X-rays of the right shoulder, humerus, chest, and pelvis with right hip are negative for acute traumatic findings.  Pain controlled in the emergency department with fentanyl and Tylenol.  The patient ambulated through the department with a walker without difficulty.  Shared decision-making conversation with the patient and his caregiver since the patient lives alone and does not have a caregiver present overnight.  His caregiver reports that she will be staying with him overnight tonight due to the fall.  Discussed controlling his pain with Tylenol and lidocaine ointment to avoid giving him narcotics, which could make him more likely to fall.  The patient and his caregiver were agreeable with this plan at this time.  The patient was given strict return precautions to return to the emergency department for uncontrolled pain, new falls, or other new concerning symptoms.  He is hemodynamically stable at this time.  No acute distress.  The patient is safe for discharge at this time.  Final Clinical Impressions(s) / ED Diagnoses   Final  diagnoses:  Fall, initial encounter  Acute pain of right shoulder  Right hip pain    ED Discharge Orders        Ordered    lidocaine (XYLOCAINE) 5 % ointment  As needed     04/04/17 1614       Teyonna Plaisted, Laymond Purser, PA-C 04/04/17 [place], [place], MD 04/05/17 [id number]

## 2017-04-04 NOTE — ED Triage Notes (Signed)
Patient brought in by EMS from home. Patient fell before 0930 this morning and his at home nurses found him down when they arrived and he is complaining of right shoulder and right upper thigh pain. The fall was unwitnessed. Patient denies LOC. Denies N/V/ or neck or back pain. Normally ambulates with a cane or walker at home now he cannot bare weight on his leg.

## 2017-04-05 ENCOUNTER — Other Ambulatory Visit: Payer: Self-pay | Admitting: Adult Health

## 2017-04-05 ENCOUNTER — Ambulatory Visit: Payer: Medicare Other | Admitting: Adult Health

## 2017-04-05 DIAGNOSIS — R1084 Generalized abdominal pain: Secondary | ICD-10-CM

## 2017-04-10 ENCOUNTER — Ambulatory Visit: Payer: Medicare Other | Admitting: Family Medicine

## 2017-04-16 ENCOUNTER — Ambulatory Visit (INDEPENDENT_AMBULATORY_CARE_PROVIDER_SITE_OTHER): Payer: Medicare Other | Admitting: Family Medicine

## 2017-04-16 ENCOUNTER — Encounter: Payer: Self-pay | Admitting: Family Medicine

## 2017-04-16 DIAGNOSIS — R0789 Other chest pain: Secondary | ICD-10-CM

## 2017-04-16 NOTE — Progress Notes (Signed)
Ronald Strong is a 83 year old widowed male nonsmoker who comes in today accompanied by his primary caregiver Ronald Strong for follow-up of multiple issues  He continues to have issues with falling. He fall 3 weeks ago. The taken the emergency room. Did multiple x-rays all of which showed no fracture than a week ago Monday fell again and sustained a bruise to his right lateral chest wall. The fall seem to be in the morning when he gets himself dressed therefore we have worked with his daughter Ronald Strong and packed to make sure that somebody's with him when he gets dressed in the morning  He's taking a narcotic pain pill one half tab twice daily. This helps control his pain.  He's off the Flomax because of hypotension. BP today 100/50 right arm sitting position  Vaccinations up-to-date  Pulse 78   Temp 98.6 F (37 C) (Oral)   SpO2 94%  Well-developed well-nourished male no acute distress vital signs stable he is afebrile. Examination the chest shows superficial hematoma right lateral chest wall. Lungs are clear  #1 contusion right chest wall.......... discussed with Ronald Strong and his daughter Ronald Strong things we can do in the future to prevent any further falls.Marland Kitchen

## 2017-04-16 NOTE — Patient Instructions (Signed)
Continue your current treatment program  Call if any problems

## 2017-04-26 ENCOUNTER — Other Ambulatory Visit: Payer: Self-pay

## 2017-04-26 MED ORDER — HYDROCHLOROTHIAZIDE 12.5 MG PO CAPS: ORAL_CAPSULE | ORAL | 3 refills | Status: AC

## 2017-05-07 ENCOUNTER — Telehealth: Payer: Self-pay | Admitting: Family Medicine

## 2017-05-07 NOTE — Telephone Encounter (Signed)
Spoke with pt daughter voiced understanding that a referral has been faxed to [facility].

## 2017-05-07 NOTE — Telephone Encounter (Unsigned)
Copied from [place]. Topic: Quick Communication - See Telephone Encounter >> May 07, 2017 10:12 AM Boyd Kerbs wrote: CRM for notification. See Telephone encounter for:  Ronald Strong returning call, regarding help with Hospice for dad 05/07/17.

## 2017-05-08 DIAGNOSIS — N401 Enlarged prostate with lower urinary tract symptoms: Secondary | ICD-10-CM | POA: Diagnosis not present

## 2017-05-08 DIAGNOSIS — J449 Chronic obstructive pulmonary disease, unspecified: Secondary | ICD-10-CM | POA: Diagnosis not present

## 2017-05-08 DIAGNOSIS — F339 Major depressive disorder, recurrent, unspecified: Secondary | ICD-10-CM | POA: Diagnosis not present

## 2017-05-08 DIAGNOSIS — E039 Hypothyroidism, unspecified: Secondary | ICD-10-CM | POA: Diagnosis not present

## 2017-05-08 DIAGNOSIS — I509 Heart failure, unspecified: Secondary | ICD-10-CM | POA: Diagnosis not present

## 2017-05-08 DIAGNOSIS — I10 Essential (primary) hypertension: Secondary | ICD-10-CM | POA: Diagnosis not present

## 2017-05-08 DIAGNOSIS — J9620 Acute and chronic respiratory failure, unspecified whether with hypoxia or hypercapnia: Secondary | ICD-10-CM | POA: Diagnosis not present

## 2017-05-08 NOTE — Telephone Encounter (Signed)
Caller name: Danise Mina Relation to pt: [facility] Call back number: [phone]    Reason for call:  Schedule for afternoon visit today, requesting office notes please fax to [phone].

## 2017-05-08 NOTE — Telephone Encounter (Signed)
Spoke with Jenn from [facility] verified that Hospice needs  pt medical record from our practice for pt admission to Hospice, Document have been faxed and confirmed.

## 2017-05-09 DIAGNOSIS — E039 Hypothyroidism, unspecified: Secondary | ICD-10-CM | POA: Diagnosis not present

## 2017-05-09 DIAGNOSIS — J449 Chronic obstructive pulmonary disease, unspecified: Secondary | ICD-10-CM | POA: Diagnosis not present

## 2017-05-09 DIAGNOSIS — F339 Major depressive disorder, recurrent, unspecified: Secondary | ICD-10-CM | POA: Diagnosis not present

## 2017-05-09 DIAGNOSIS — I10 Essential (primary) hypertension: Secondary | ICD-10-CM | POA: Diagnosis not present

## 2017-05-09 DIAGNOSIS — J9620 Acute and chronic respiratory failure, unspecified whether with hypoxia or hypercapnia: Secondary | ICD-10-CM | POA: Diagnosis not present

## 2017-05-09 DIAGNOSIS — I509 Heart failure, unspecified: Secondary | ICD-10-CM | POA: Diagnosis not present

## 2017-05-10 DIAGNOSIS — E039 Hypothyroidism, unspecified: Secondary | ICD-10-CM | POA: Diagnosis not present

## 2017-05-10 DIAGNOSIS — I10 Essential (primary) hypertension: Secondary | ICD-10-CM | POA: Diagnosis not present

## 2017-05-10 DIAGNOSIS — J449 Chronic obstructive pulmonary disease, unspecified: Secondary | ICD-10-CM | POA: Diagnosis not present

## 2017-05-10 DIAGNOSIS — F339 Major depressive disorder, recurrent, unspecified: Secondary | ICD-10-CM | POA: Diagnosis not present

## 2017-05-10 DIAGNOSIS — I509 Heart failure, unspecified: Secondary | ICD-10-CM | POA: Diagnosis not present

## 2017-05-10 DIAGNOSIS — J9620 Acute and chronic respiratory failure, unspecified whether with hypoxia or hypercapnia: Secondary | ICD-10-CM | POA: Diagnosis not present

## 2017-05-14 DIAGNOSIS — I10 Essential (primary) hypertension: Secondary | ICD-10-CM | POA: Diagnosis not present

## 2017-05-14 DIAGNOSIS — J9620 Acute and chronic respiratory failure, unspecified whether with hypoxia or hypercapnia: Secondary | ICD-10-CM | POA: Diagnosis not present

## 2017-05-14 DIAGNOSIS — I509 Heart failure, unspecified: Secondary | ICD-10-CM | POA: Diagnosis not present

## 2017-05-14 DIAGNOSIS — E039 Hypothyroidism, unspecified: Secondary | ICD-10-CM | POA: Diagnosis not present

## 2017-05-14 DIAGNOSIS — J449 Chronic obstructive pulmonary disease, unspecified: Secondary | ICD-10-CM | POA: Diagnosis not present

## 2017-05-14 DIAGNOSIS — F339 Major depressive disorder, recurrent, unspecified: Secondary | ICD-10-CM | POA: Diagnosis not present

## 2017-05-18 DIAGNOSIS — E039 Hypothyroidism, unspecified: Secondary | ICD-10-CM | POA: Diagnosis not present

## 2017-05-18 DIAGNOSIS — J9620 Acute and chronic respiratory failure, unspecified whether with hypoxia or hypercapnia: Secondary | ICD-10-CM | POA: Diagnosis not present

## 2017-05-18 DIAGNOSIS — J449 Chronic obstructive pulmonary disease, unspecified: Secondary | ICD-10-CM | POA: Diagnosis not present

## 2017-05-18 DIAGNOSIS — F339 Major depressive disorder, recurrent, unspecified: Secondary | ICD-10-CM | POA: Diagnosis not present

## 2017-05-18 DIAGNOSIS — I10 Essential (primary) hypertension: Secondary | ICD-10-CM | POA: Diagnosis not present

## 2017-05-18 DIAGNOSIS — I509 Heart failure, unspecified: Secondary | ICD-10-CM | POA: Diagnosis not present

## 2017-05-22 DIAGNOSIS — I10 Essential (primary) hypertension: Secondary | ICD-10-CM | POA: Diagnosis not present

## 2017-05-22 DIAGNOSIS — J9620 Acute and chronic respiratory failure, unspecified whether with hypoxia or hypercapnia: Secondary | ICD-10-CM | POA: Diagnosis not present

## 2017-05-22 DIAGNOSIS — J449 Chronic obstructive pulmonary disease, unspecified: Secondary | ICD-10-CM | POA: Diagnosis not present

## 2017-05-22 DIAGNOSIS — E039 Hypothyroidism, unspecified: Secondary | ICD-10-CM | POA: Diagnosis not present

## 2017-05-22 DIAGNOSIS — F339 Major depressive disorder, recurrent, unspecified: Secondary | ICD-10-CM | POA: Diagnosis not present

## 2017-05-22 DIAGNOSIS — I509 Heart failure, unspecified: Secondary | ICD-10-CM | POA: Diagnosis not present

## 2017-05-30 ENCOUNTER — Ambulatory Visit: Admitting: Family Medicine

## 2017-05-30 DIAGNOSIS — J449 Chronic obstructive pulmonary disease, unspecified: Secondary | ICD-10-CM | POA: Diagnosis not present

## 2017-05-30 DIAGNOSIS — E039 Hypothyroidism, unspecified: Secondary | ICD-10-CM | POA: Diagnosis not present

## 2017-05-30 DIAGNOSIS — Z0289 Encounter for other administrative examinations: Secondary | ICD-10-CM

## 2017-05-30 DIAGNOSIS — I509 Heart failure, unspecified: Secondary | ICD-10-CM | POA: Diagnosis not present

## 2017-05-30 DIAGNOSIS — F339 Major depressive disorder, recurrent, unspecified: Secondary | ICD-10-CM | POA: Diagnosis not present

## 2017-05-30 DIAGNOSIS — I10 Essential (primary) hypertension: Secondary | ICD-10-CM | POA: Diagnosis not present

## 2017-05-30 DIAGNOSIS — J9620 Acute and chronic respiratory failure, unspecified whether with hypoxia or hypercapnia: Secondary | ICD-10-CM | POA: Diagnosis not present

## 2017-05-31 ENCOUNTER — Ambulatory Visit (INDEPENDENT_AMBULATORY_CARE_PROVIDER_SITE_OTHER): Admitting: Family Medicine

## 2017-05-31 ENCOUNTER — Encounter: Payer: Self-pay | Admitting: Family Medicine

## 2017-05-31 VITALS — BP 110/60 | HR 59 | Temp 97.4°F | Wt 130.7 lb

## 2017-05-31 DIAGNOSIS — I509 Heart failure, unspecified: Secondary | ICD-10-CM | POA: Diagnosis not present

## 2017-05-31 DIAGNOSIS — J449 Chronic obstructive pulmonary disease, unspecified: Secondary | ICD-10-CM | POA: Diagnosis not present

## 2017-05-31 DIAGNOSIS — J9620 Acute and chronic respiratory failure, unspecified whether with hypoxia or hypercapnia: Secondary | ICD-10-CM | POA: Diagnosis not present

## 2017-05-31 DIAGNOSIS — T161XXA Foreign body in right ear, initial encounter: Secondary | ICD-10-CM | POA: Diagnosis not present

## 2017-05-31 DIAGNOSIS — F339 Major depressive disorder, recurrent, unspecified: Secondary | ICD-10-CM | POA: Diagnosis not present

## 2017-05-31 DIAGNOSIS — I10 Essential (primary) hypertension: Secondary | ICD-10-CM | POA: Diagnosis not present

## 2017-05-31 DIAGNOSIS — E039 Hypothyroidism, unspecified: Secondary | ICD-10-CM | POA: Diagnosis not present

## 2017-05-31 NOTE — Progress Notes (Signed)
Subjective:    Patient ID: Ronald Strong, male    DOB: July 20, 1920, 84 y.o.   MRN: [id number]  Chief Complaint  Patient presents with  . Acute Visit    HPI Patient was seen today for acute concern. Pt is accompanied by Fraser Din, his caregiver.  Patient wears a hearing aid in his right ear.  The ear piece was noted to be stopped in his ear yesterday.  Per caregiver she was unable to see it later on in the day and became concerned.  She initially called hospice who advised patient to go to the emergency room, then she called the clinic for an appointment.  Patient denies pain, difficulty hearing, or discomfort.  No drainage noted from right ear.  Overall pt is doing well.  He typically wears O2 at night and when ambulating as he desats into the 80s.    Past Medical History:  Diagnosis Date  . Allergy   . Arrhythmia   . Arthritis   . Chicken pox   . Colon polyps   . Depression   . Diverticulitis   . Hypertension   . Thyroid disease     Allergies  Allergen Reactions  . Bee Venom Anaphylaxis  . Epinephrine Anaphylaxis    severe anaphylaxis  . Pollen Extract     sneezing    ROS General: Denies fever, chills, night sweats, changes in weight, changes in appetite HEENT: Denies headaches, ear pain, changes in vision, rhinorrhea, sore throat   +hearing aid ear piece stuck in R ear canal CV: Denies CP, palpitations, SOB, orthopnea Pulm: Denies SOB, cough, wheezing GI: Denies abdominal pain, nausea, vomiting, diarrhea, constipation GU: Denies dysuria, hematuria, frequency, vaginal discharge Msk: Denies muscle cramps, joint pains Neuro: Denies weakness, numbness, tingling Skin: Denies rashes, bruising Psych: Denies depression, anxiety, hallucinations     Objective:    Blood pressure 110/60, pulse (!) 59, temperature (!) 97.4 F (36.3 C), temperature source Oral, weight 130 lb 11.2 oz (59.3 kg).   Gen. Pleasant, playful/happy gentleman, well-nourished, in no distress, normal affect, on  RA.  Pt sitting in a transport wheelchair. HEENT: [place]/AT, face symmetric,bags underneath eyes b/l,  no scleral icterus, PERRLA, EOMI, nares patent without drainage.  B/l ear canals straight without cerumen.  L TM visualized and nml.  R TM initially occluded by dark object.  Alligator forceps used to remove ear piece of hearing aid successfully in 1 attempt.  Pt tolerated procedure well.  R TM then visualized and noted to be normal. Lungs: no accessory muscle use, CTAB, no wheezes or rales Cardiovascular: bradycardic, no peripheral edema Skin:  Warm, dry, intact, no lesions/ rash   Wt Readings from Last 3 Encounters:  05/31/17 130 lb 11.2 oz (59.3 kg)  04/04/17 150 lb (68 kg)  02/07/17 142 lb (64.4 kg)    Lab Results  Component Value Date   WBC 11.0 (H) 12/12/2016   HGB 13.0 12/12/2016   HCT 40.9 12/12/2016   PLT 166 12/12/2016   GLUCOSE 107 (H) 12/13/2016   ALT 13 (L) 12/11/2016   AST 19 12/11/2016   NA 138 12/13/2016   K 3.9 12/13/2016   CL 104 12/13/2016   CREATININE 1.16 12/13/2016   BUN 27 (H) 12/13/2016   CO2 29 12/13/2016   TSH 6.03 (H) 10/17/2016    Assessment/Plan:  Foreign body of right ear, initial encounter  -ear piece from hearing aid successfully removed from pt's R ear with alligator forceps -Pt encouraged to wear both hearing aids.  He declines at this time. -f/u prn.   Grier Mitts, MD

## 2017-06-01 DIAGNOSIS — N401 Enlarged prostate with lower urinary tract symptoms: Secondary | ICD-10-CM | POA: Diagnosis not present

## 2017-06-01 DIAGNOSIS — J449 Chronic obstructive pulmonary disease, unspecified: Secondary | ICD-10-CM | POA: Diagnosis not present

## 2017-06-01 DIAGNOSIS — I509 Heart failure, unspecified: Secondary | ICD-10-CM | POA: Diagnosis not present

## 2017-06-01 DIAGNOSIS — I10 Essential (primary) hypertension: Secondary | ICD-10-CM | POA: Diagnosis not present

## 2017-06-01 DIAGNOSIS — J9620 Acute and chronic respiratory failure, unspecified whether with hypoxia or hypercapnia: Secondary | ICD-10-CM | POA: Diagnosis not present

## 2017-06-01 DIAGNOSIS — E039 Hypothyroidism, unspecified: Secondary | ICD-10-CM | POA: Diagnosis not present

## 2017-06-01 DIAGNOSIS — F339 Major depressive disorder, recurrent, unspecified: Secondary | ICD-10-CM | POA: Diagnosis not present

## 2017-06-07 DIAGNOSIS — I10 Essential (primary) hypertension: Secondary | ICD-10-CM | POA: Diagnosis not present

## 2017-06-07 DIAGNOSIS — E039 Hypothyroidism, unspecified: Secondary | ICD-10-CM | POA: Diagnosis not present

## 2017-06-07 DIAGNOSIS — F339 Major depressive disorder, recurrent, unspecified: Secondary | ICD-10-CM | POA: Diagnosis not present

## 2017-06-07 DIAGNOSIS — J449 Chronic obstructive pulmonary disease, unspecified: Secondary | ICD-10-CM | POA: Diagnosis not present

## 2017-06-07 DIAGNOSIS — J9620 Acute and chronic respiratory failure, unspecified whether with hypoxia or hypercapnia: Secondary | ICD-10-CM | POA: Diagnosis not present

## 2017-06-07 DIAGNOSIS — I509 Heart failure, unspecified: Secondary | ICD-10-CM | POA: Diagnosis not present

## 2017-06-08 DIAGNOSIS — J449 Chronic obstructive pulmonary disease, unspecified: Secondary | ICD-10-CM | POA: Diagnosis not present

## 2017-06-08 DIAGNOSIS — I509 Heart failure, unspecified: Secondary | ICD-10-CM | POA: Diagnosis not present

## 2017-06-08 DIAGNOSIS — F339 Major depressive disorder, recurrent, unspecified: Secondary | ICD-10-CM | POA: Diagnosis not present

## 2017-06-08 DIAGNOSIS — J9620 Acute and chronic respiratory failure, unspecified whether with hypoxia or hypercapnia: Secondary | ICD-10-CM | POA: Diagnosis not present

## 2017-06-08 DIAGNOSIS — E039 Hypothyroidism, unspecified: Secondary | ICD-10-CM | POA: Diagnosis not present

## 2017-06-08 DIAGNOSIS — I10 Essential (primary) hypertension: Secondary | ICD-10-CM | POA: Diagnosis not present

## 2017-06-12 DIAGNOSIS — I509 Heart failure, unspecified: Secondary | ICD-10-CM | POA: Diagnosis not present

## 2017-06-12 DIAGNOSIS — J449 Chronic obstructive pulmonary disease, unspecified: Secondary | ICD-10-CM | POA: Diagnosis not present

## 2017-06-12 DIAGNOSIS — F339 Major depressive disorder, recurrent, unspecified: Secondary | ICD-10-CM | POA: Diagnosis not present

## 2017-06-12 DIAGNOSIS — I10 Essential (primary) hypertension: Secondary | ICD-10-CM | POA: Diagnosis not present

## 2017-06-12 DIAGNOSIS — E039 Hypothyroidism, unspecified: Secondary | ICD-10-CM | POA: Diagnosis not present

## 2017-06-12 DIAGNOSIS — J9620 Acute and chronic respiratory failure, unspecified whether with hypoxia or hypercapnia: Secondary | ICD-10-CM | POA: Diagnosis not present

## 2017-06-19 DIAGNOSIS — I509 Heart failure, unspecified: Secondary | ICD-10-CM | POA: Diagnosis not present

## 2017-06-19 DIAGNOSIS — E039 Hypothyroidism, unspecified: Secondary | ICD-10-CM | POA: Diagnosis not present

## 2017-06-19 DIAGNOSIS — F339 Major depressive disorder, recurrent, unspecified: Secondary | ICD-10-CM | POA: Diagnosis not present

## 2017-06-19 DIAGNOSIS — I10 Essential (primary) hypertension: Secondary | ICD-10-CM | POA: Diagnosis not present

## 2017-06-19 DIAGNOSIS — J9620 Acute and chronic respiratory failure, unspecified whether with hypoxia or hypercapnia: Secondary | ICD-10-CM | POA: Diagnosis not present

## 2017-06-19 DIAGNOSIS — J449 Chronic obstructive pulmonary disease, unspecified: Secondary | ICD-10-CM | POA: Diagnosis not present

## 2017-06-26 ENCOUNTER — Other Ambulatory Visit: Payer: Self-pay | Admitting: Family Medicine

## 2017-06-26 DIAGNOSIS — J9620 Acute and chronic respiratory failure, unspecified whether with hypoxia or hypercapnia: Secondary | ICD-10-CM | POA: Diagnosis not present

## 2017-06-26 DIAGNOSIS — I509 Heart failure, unspecified: Secondary | ICD-10-CM | POA: Diagnosis not present

## 2017-06-26 DIAGNOSIS — F339 Major depressive disorder, recurrent, unspecified: Secondary | ICD-10-CM | POA: Diagnosis not present

## 2017-06-26 DIAGNOSIS — I10 Essential (primary) hypertension: Secondary | ICD-10-CM | POA: Diagnosis not present

## 2017-06-26 DIAGNOSIS — E039 Hypothyroidism, unspecified: Secondary | ICD-10-CM | POA: Diagnosis not present

## 2017-06-26 DIAGNOSIS — J449 Chronic obstructive pulmonary disease, unspecified: Secondary | ICD-10-CM | POA: Diagnosis not present

## 2017-06-29 DIAGNOSIS — I509 Heart failure, unspecified: Secondary | ICD-10-CM | POA: Diagnosis not present

## 2017-06-29 DIAGNOSIS — F339 Major depressive disorder, recurrent, unspecified: Secondary | ICD-10-CM | POA: Diagnosis not present

## 2017-06-29 DIAGNOSIS — E039 Hypothyroidism, unspecified: Secondary | ICD-10-CM | POA: Diagnosis not present

## 2017-06-29 DIAGNOSIS — J449 Chronic obstructive pulmonary disease, unspecified: Secondary | ICD-10-CM | POA: Diagnosis not present

## 2017-06-29 DIAGNOSIS — I10 Essential (primary) hypertension: Secondary | ICD-10-CM | POA: Diagnosis not present

## 2017-06-29 DIAGNOSIS — J9620 Acute and chronic respiratory failure, unspecified whether with hypoxia or hypercapnia: Secondary | ICD-10-CM | POA: Diagnosis not present

## 2017-06-29 DIAGNOSIS — N401 Enlarged prostate with lower urinary tract symptoms: Secondary | ICD-10-CM | POA: Diagnosis not present

## 2017-07-01 DIAGNOSIS — J449 Chronic obstructive pulmonary disease, unspecified: Secondary | ICD-10-CM | POA: Diagnosis not present

## 2017-07-01 DIAGNOSIS — J9620 Acute and chronic respiratory failure, unspecified whether with hypoxia or hypercapnia: Secondary | ICD-10-CM | POA: Diagnosis not present

## 2017-07-01 DIAGNOSIS — F339 Major depressive disorder, recurrent, unspecified: Secondary | ICD-10-CM | POA: Diagnosis not present

## 2017-07-01 DIAGNOSIS — I10 Essential (primary) hypertension: Secondary | ICD-10-CM | POA: Diagnosis not present

## 2017-07-01 DIAGNOSIS — I509 Heart failure, unspecified: Secondary | ICD-10-CM | POA: Diagnosis not present

## 2017-07-01 DIAGNOSIS — E039 Hypothyroidism, unspecified: Secondary | ICD-10-CM | POA: Diagnosis not present

## 2017-07-03 ENCOUNTER — Other Ambulatory Visit: Payer: Self-pay | Admitting: Family Medicine

## 2017-07-03 DIAGNOSIS — J9620 Acute and chronic respiratory failure, unspecified whether with hypoxia or hypercapnia: Secondary | ICD-10-CM | POA: Diagnosis not present

## 2017-07-03 DIAGNOSIS — I509 Heart failure, unspecified: Secondary | ICD-10-CM | POA: Diagnosis not present

## 2017-07-03 DIAGNOSIS — J449 Chronic obstructive pulmonary disease, unspecified: Secondary | ICD-10-CM | POA: Diagnosis not present

## 2017-07-03 DIAGNOSIS — F339 Major depressive disorder, recurrent, unspecified: Secondary | ICD-10-CM | POA: Diagnosis not present

## 2017-07-03 DIAGNOSIS — I10 Essential (primary) hypertension: Secondary | ICD-10-CM | POA: Diagnosis not present

## 2017-07-03 DIAGNOSIS — E039 Hypothyroidism, unspecified: Secondary | ICD-10-CM | POA: Diagnosis not present

## 2017-07-03 NOTE — Telephone Encounter (Unsigned)
Copied from [place] [id number]. Topic: Quick Communication - Rx Refill/Question >> Jul 03, 2017 11:26 AM Carolyn Stare wrote: Medication   HYDROCODONE-ACETAMINOPHEN PO   Preferred Pharmacy  [facility]    Agent: Please be advised that RX refills may take up to 3 business days. We ask that you follow-up with your pharmacy.

## 2017-07-04 ENCOUNTER — Other Ambulatory Visit: Payer: Self-pay

## 2017-07-04 MED ORDER — HYDROCODONE-ACETAMINOPHEN 5-325 MG PO TABS: ORAL_TABLET | ORAL | 0 refills | Status: AC

## 2017-07-04 NOTE — Telephone Encounter (Signed)
Request for refill of pain medication:  Hydrocodone- Acetaminophen  LOV: 04/16/17 05/31/17 (acute)   PCP: Sherren Mocha  Pharmacy: verified

## 2017-07-04 NOTE — Telephone Encounter (Signed)
Rx for Hydrocodone has been printed and is ready for pick up from the office. The pt caregiver Miss Ronald Strong will pick up the Rx from the office. Pt daughter is aware.

## 2017-07-09 ENCOUNTER — Ambulatory Visit: Payer: No Typology Code available for payment source | Admitting: Physical Medicine & Rehabilitation

## 2017-07-10 ENCOUNTER — Encounter: Payer: Self-pay | Admitting: Physical Medicine & Rehabilitation

## 2017-07-10 ENCOUNTER — Encounter: Payer: Medicare Other | Attending: Physical Medicine & Rehabilitation

## 2017-07-10 ENCOUNTER — Ambulatory Visit (HOSPITAL_BASED_OUTPATIENT_CLINIC_OR_DEPARTMENT_OTHER): Admitting: Physical Medicine & Rehabilitation

## 2017-07-10 VITALS — BP 117/61 | HR 71

## 2017-07-10 DIAGNOSIS — M7541 Impingement syndrome of right shoulder: Secondary | ICD-10-CM | POA: Insufficient documentation

## 2017-07-10 DIAGNOSIS — Z87891 Personal history of nicotine dependence: Secondary | ICD-10-CM | POA: Insufficient documentation

## 2017-07-10 DIAGNOSIS — E039 Hypothyroidism, unspecified: Secondary | ICD-10-CM | POA: Diagnosis not present

## 2017-07-10 DIAGNOSIS — I509 Heart failure, unspecified: Secondary | ICD-10-CM | POA: Diagnosis not present

## 2017-07-10 DIAGNOSIS — G8929 Other chronic pain: Secondary | ICD-10-CM | POA: Diagnosis not present

## 2017-07-10 DIAGNOSIS — Z8249 Family history of ischemic heart disease and other diseases of the circulatory system: Secondary | ICD-10-CM | POA: Insufficient documentation

## 2017-07-10 DIAGNOSIS — M25511 Pain in right shoulder: Secondary | ICD-10-CM

## 2017-07-10 DIAGNOSIS — Z9889 Other specified postprocedural states: Secondary | ICD-10-CM | POA: Insufficient documentation

## 2017-07-10 DIAGNOSIS — Z96641 Presence of right artificial hip joint: Secondary | ICD-10-CM | POA: Diagnosis not present

## 2017-07-10 DIAGNOSIS — M1990 Unspecified osteoarthritis, unspecified site: Secondary | ICD-10-CM | POA: Diagnosis not present

## 2017-07-10 DIAGNOSIS — J449 Chronic obstructive pulmonary disease, unspecified: Secondary | ICD-10-CM | POA: Diagnosis not present

## 2017-07-10 DIAGNOSIS — Z86010 Personal history of colonic polyps: Secondary | ICD-10-CM | POA: Insufficient documentation

## 2017-07-10 DIAGNOSIS — Z8261 Family history of arthritis: Secondary | ICD-10-CM | POA: Diagnosis not present

## 2017-07-10 DIAGNOSIS — F339 Major depressive disorder, recurrent, unspecified: Secondary | ICD-10-CM | POA: Diagnosis not present

## 2017-07-10 DIAGNOSIS — I10 Essential (primary) hypertension: Secondary | ICD-10-CM | POA: Diagnosis not present

## 2017-07-10 DIAGNOSIS — J9620 Acute and chronic respiratory failure, unspecified whether with hypoxia or hypercapnia: Secondary | ICD-10-CM | POA: Diagnosis not present

## 2017-07-10 NOTE — Progress Notes (Signed)
Subjective:    Patient ID: Ronald Strong, male    DOB: May 03, 1920, 84 y.o.   MRN: [id number]  HPI CC Right shoulder pain  Pt fell 04/14/17 went to ED, immediate onset of Right shoulder pain and hip pain.  Shoulder and hip films were negative.  Treated and released from ED.    No neck pain.  No arm numbness or weakness other than with overhead shoulder movement Most painful movement is reaching behind the back. No subsequent falls. His caregiver stays with him about 8 hours a day.  She helps him with his dressing.  She notes that patient does have pain when attempting to dress. The patient denies any pain when he is trying to sleep.  No pain when his arm is at rest. Pain Inventory Average Pain 6 Pain Right Now 7 My pain is dull  In the last 24 hours, has pain interfered with the following? General activity 5 Relation with others 5 Enjoyment of life 5 What TIME of day is your pain at its worst? daytime Sleep (in general) Fair  Pain is worse with: walking Pain improves with: heat/ice Relief from Meds: .  Mobility walk without assistance  Function retired  Neuro/Psych trouble walking  Prior Studies Any changes since last visit?  no  Physicians involved in your care Any changes since last visit?  no   Family History  Problem Relation Age of Onset  . Arthritis Other   . Heart disease Other   . Hypertension Other    Social History   Socioeconomic History  . Marital status: Married    Spouse name: Not on file  . Number of children: Not on file  . Years of education: Not on file  . Highest education level: Not on file  Social Needs  . Financial resource strain: Not on file  . Food insecurity - worry: Not on file  . Food insecurity - inability: Not on file  . Transportation needs - medical: Not on file  . Transportation needs - non-medical: Not on file  Occupational History  . Not on file  Tobacco Use  . Smoking status: Former Smoker    Last attempt to quit:  06/04/1983    Years since quitting: 34.1  . Smokeless tobacco: Never Used  Substance and Sexual Activity  . Alcohol use: Yes  . Drug use: No  . Sexual activity: Not on file  Other Topics Concern  . Not on file  Social History Narrative  . Not on file   Past Surgical History:  Procedure Laterality Date  . OTHER SURGICAL HISTORY  09/01/10   spine decompression  . PROSTATE SURGERY  01/28/13   laser  . SPINE SURGERY  09/11/10  . TOTAL HIP ARTHROPLASTY  09/21/06   right   Past Medical History:  Diagnosis Date  . Allergy   . Arrhythmia   . Arthritis   . Chicken pox   . Colon polyps   . Depression   . Diverticulitis   . Hypertension   . Thyroid disease    There were no vitals taken for this visit.  Opioid Risk Score:   Fall Risk Score:  `1  Depression screen PHQ 2/9  Depression screen [facility] 2/9 10/20/2016 07/10/2016 05/11/2015 06/03/2013  Decreased Interest 0 0 0 0  Down, Depressed, Hopeless 0 0 0 0  PHQ - 2 Score 0 0 0 0     Review of Systems  Constitutional: Negative.   HENT: Negative.   Eyes: Negative.  Respiratory: Negative.   Cardiovascular: Negative.   Gastrointestinal: Negative.   Endocrine: Negative.   Genitourinary: Negative.   Musculoskeletal: Positive for arthralgias, joint swelling and myalgias.  Skin: Negative.   Allergic/Immunologic: Negative.   Neurological: Negative.   Hematological: Negative.   Psychiatric/Behavioral: Negative.   All other systems reviewed and are negative.      Objective:   Physical Exam  Constitutional: He is oriented to person, place, and time. He appears well-developed and well-nourished.  HENT:  Head: Normocephalic and atraumatic.  Eyes: Conjunctivae and EOM are normal. Pupils are equal, round, and reactive to light.  Neck: Neck supple. No JVD present.  Pulmonary/Chest: No stridor.  Musculoskeletal:       Right shoulder: He exhibits decreased range of motion, deformity, pain and decreased strength. He exhibits no  tenderness, no bony tenderness, no swelling and no effusion.  There is reduced range of motion with external rotation of the right shoulder as well as right shoulder internal rotation end range pain with either direction, abduction limited to approximately 120 degrees No crepitus  Neurological: He is alert and oriented to person, place, and time.  Skin: Skin is warm and dry.  Psychiatric: He has a normal mood and affect. His behavior is normal.  Nursing note and vitals reviewed.   + Michel Bickers Mild right shoulder girdle atrophy No pain in the subacromial area to palpation no pain over the acromioclavicular joint. Positive Neer's test Negative drop arm test Motor strength is 4/5 in the deltoid on the right side related pain 5/5 in the right biceps triceps finger flexors and extensors     Assessment & Plan:  1.  Right shoulder impingement syndrome after fall, no exam evidence of large full thickness tear  Right shoulder xray neg 66mo duration of pain , given age would avoid NSAID  Return to clinic in 4-6 weeks.  If no better would try ultrasound-guided injection  Shoulder injection Right subacromial   Indication:RIght Shoulder pain not relieved by medication management and other conservative care.  Informed consent was obtained after describing risks and benefits of the procedure with the patient, this includes bleeding, bruising, infection and medication side effects. The patient wishes to proceed and has given written consent. Patient was placed in a seated position. The RIght shoulder was marked and prepped with betadine in the subacromial area. A 25-gauge 1-1/2 inch needle was inserted into the subacromial area. After negative draw back for blood, a solution containing 1 mL of 6 mg per ML betamethasone and 4 mL of 1% lidocaine was injected. A band aid was applied. The patient tolerated the procedure well. Post procedure instructions were given.

## 2017-07-10 NOTE — Patient Instructions (Signed)
Shoulder Impingement Syndrome Shoulder impingement syndrome is a condition that causes pain when connective tissues (tendons) surrounding the shoulder joint become pinched. These tendons are part of the group of muscles and tissues that help to stabilize the shoulder (rotator cuff). Beneath the rotator cuff is a fluid-filled sac (bursa) that allows the muscles and tendons to glide smoothly. The bursa may become swollen or irritated (bursitis). Bursitis, swelling in the rotator cuff tendons, or both conditions can decrease how much space is under a bone in the shoulder joint (acromion), resulting in impingement. What are the causes? Shoulder impingement syndrome can be caused by bursitis or swelling of the rotator cuff tendons, which may result from:  Repetitive overhead arm movements.  Falling onto the shoulder.  Weakness in the shoulder muscles.  What increases the risk? You may be more likely to develop this condition if you are an athlete who participates in:  Sports that involve throwing, such as baseball.  Tennis.  Swimming.  Volleyball.  Some people are also more likely to develop impingement syndrome because of the shape of their acromion bone. What are the signs or symptoms? The main symptom of this condition is pain on the front or side of the shoulder. Pain may:  Get worse when lifting or raising the arm.  Get worse at night.  Wake you up from sleeping.  Feel sharp when the shoulder is moved, and then fade to an ache.  Other signs and symptoms may include:  Tenderness.  Stiffness.  Inability to raise the arm above shoulder level or behind the body.  Weakness.  How is this diagnosed? This condition may be diagnosed based on:  Your symptoms.  Your medical history.  A physical exam.  Imaging tests, such as: ? X-rays. ? MRI. ? Ultrasound.  How is this treated? Treatment for this condition may include:  Resting your shoulder and avoiding all  activities that cause pain or put stress on the shoulder.  Icing your shoulder.  NSAIDs to help reduce pain and swelling.  One or more injections of medicines to numb the area and reduce inflammation.  Physical therapy.  Surgery. This may be needed if nonsurgical treatments have not helped. Surgery may involve repairing the rotator cuff, reshaping the acromion, or removing the bursa.  Follow these instructions at home: Managing pain, stiffness, and swelling  If directed, apply ice to the injured area. ? Put ice in a plastic bag. ? Place a towel between your skin and the bag. ? Leave the ice on for 20 minutes, 2-3 times a day. Activity  Rest and return to your normal activities as told by your health care provider. Ask your health care provider what activities are safe for you.  Do exercises as told by your health care provider. General instructions  Do not use any tobacco products, including cigarettes, chewing tobacco, or e-cigarettes. Tobacco can delay healing. If you need help quitting, ask your health care provider.  Ask your health care provider when it is safe for you to drive.  Take over-the-counter and prescription medicines only as told by your health care provider.  Keep all follow-up visits as told by your health care provider. This is important. How is this prevented?  Give your body time to rest between periods of activity.  Be safe and responsible while being active to avoid falls.  Maintain physical fitness, including strength and flexibility. Contact a health care provider if:  Your symptoms have not improved after 1-2 months of treatment and   rest.  You cannot lift your arm away from your body. This information is not intended to replace advice given to you by your health care provider. Make sure you discuss any questions you have with your health care provider. Document Released: 04/17/2005 Document Revised: 12/23/2015 Document Reviewed:  03/20/2015 Elsevier Interactive Patient Education  2018 Elsevier Inc.  

## 2017-07-11 DIAGNOSIS — J9620 Acute and chronic respiratory failure, unspecified whether with hypoxia or hypercapnia: Secondary | ICD-10-CM | POA: Diagnosis not present

## 2017-07-11 DIAGNOSIS — F339 Major depressive disorder, recurrent, unspecified: Secondary | ICD-10-CM | POA: Diagnosis not present

## 2017-07-11 DIAGNOSIS — J449 Chronic obstructive pulmonary disease, unspecified: Secondary | ICD-10-CM | POA: Diagnosis not present

## 2017-07-11 DIAGNOSIS — E039 Hypothyroidism, unspecified: Secondary | ICD-10-CM | POA: Diagnosis not present

## 2017-07-11 DIAGNOSIS — I10 Essential (primary) hypertension: Secondary | ICD-10-CM | POA: Diagnosis not present

## 2017-07-11 DIAGNOSIS — I509 Heart failure, unspecified: Secondary | ICD-10-CM | POA: Diagnosis not present

## 2017-07-17 DIAGNOSIS — J449 Chronic obstructive pulmonary disease, unspecified: Secondary | ICD-10-CM | POA: Diagnosis not present

## 2017-07-17 DIAGNOSIS — F339 Major depressive disorder, recurrent, unspecified: Secondary | ICD-10-CM | POA: Diagnosis not present

## 2017-07-17 DIAGNOSIS — E039 Hypothyroidism, unspecified: Secondary | ICD-10-CM | POA: Diagnosis not present

## 2017-07-17 DIAGNOSIS — I10 Essential (primary) hypertension: Secondary | ICD-10-CM | POA: Diagnosis not present

## 2017-07-17 DIAGNOSIS — I509 Heart failure, unspecified: Secondary | ICD-10-CM | POA: Diagnosis not present

## 2017-07-17 DIAGNOSIS — J9620 Acute and chronic respiratory failure, unspecified whether with hypoxia or hypercapnia: Secondary | ICD-10-CM | POA: Diagnosis not present

## 2017-07-24 ENCOUNTER — Telehealth: Payer: Self-pay | Admitting: Family Medicine

## 2017-07-24 DIAGNOSIS — J9620 Acute and chronic respiratory failure, unspecified whether with hypoxia or hypercapnia: Secondary | ICD-10-CM | POA: Diagnosis not present

## 2017-07-24 DIAGNOSIS — E039 Hypothyroidism, unspecified: Secondary | ICD-10-CM | POA: Diagnosis not present

## 2017-07-24 DIAGNOSIS — I509 Heart failure, unspecified: Secondary | ICD-10-CM | POA: Diagnosis not present

## 2017-07-24 DIAGNOSIS — I10 Essential (primary) hypertension: Secondary | ICD-10-CM | POA: Diagnosis not present

## 2017-07-24 DIAGNOSIS — F339 Major depressive disorder, recurrent, unspecified: Secondary | ICD-10-CM | POA: Diagnosis not present

## 2017-07-24 DIAGNOSIS — J449 Chronic obstructive pulmonary disease, unspecified: Secondary | ICD-10-CM | POA: Diagnosis not present

## 2017-07-24 NOTE — Telephone Encounter (Signed)
Copied from [place]. Topic: Quick Communication - See Telephone Encounter >> Jul 24, 2017  8:45 AM Lolita Rieger, RMA wrote: CRM for notification. See Telephone encounter for: 07/24/17.pt caregiver called and stated that pt wanted to come in for an allergy shot explained to her that those are not done here in the office so she asked if pt could have a referral to an allergist.

## 2017-07-25 NOTE — Telephone Encounter (Signed)
Spoke with Fraser Din pt caregiver states that pt insists on getting an allergy shot which he used to get previously from another clinic, caregiver states pt has been taking Benadryl but don't seem to get any relief. Advised the care giver that I will let Dr Sherren Mocha aware of the request and then call her back with recommendation.

## 2017-07-27 ENCOUNTER — Other Ambulatory Visit: Payer: Self-pay

## 2017-07-27 MED ORDER — PREDNISONE 20 MG PO TABS: ORAL_TABLET | ORAL | 0 refills | Status: DC

## 2017-07-27 NOTE — Telephone Encounter (Signed)
Rx for Prednisone 20 mg has been sent to pt pharmacy.Pt Care giver is aware.

## 2017-07-27 NOTE — Telephone Encounter (Signed)
Nancy please call in the prednisone 20 mg.........Marland Kitchen Directions...Marland KitchenMarland KitchenMarland Kitchen 1 tab for 5 days then a half a tab for 5 days...Marland KitchenMarland KitchenMarland Kitchen Dispense 15...Marland KitchenMarland KitchenMarland Kitchen No refills

## 2017-07-27 NOTE — Telephone Encounter (Signed)
Please advise 

## 2017-07-30 DIAGNOSIS — I10 Essential (primary) hypertension: Secondary | ICD-10-CM | POA: Diagnosis not present

## 2017-07-30 DIAGNOSIS — N401 Enlarged prostate with lower urinary tract symptoms: Secondary | ICD-10-CM | POA: Diagnosis not present

## 2017-07-30 DIAGNOSIS — M1990 Unspecified osteoarthritis, unspecified site: Secondary | ICD-10-CM | POA: Diagnosis not present

## 2017-07-30 DIAGNOSIS — F339 Major depressive disorder, recurrent, unspecified: Secondary | ICD-10-CM | POA: Diagnosis not present

## 2017-07-30 DIAGNOSIS — J9620 Acute and chronic respiratory failure, unspecified whether with hypoxia or hypercapnia: Secondary | ICD-10-CM | POA: Diagnosis not present

## 2017-07-30 DIAGNOSIS — J449 Chronic obstructive pulmonary disease, unspecified: Secondary | ICD-10-CM | POA: Diagnosis not present

## 2017-07-30 DIAGNOSIS — E039 Hypothyroidism, unspecified: Secondary | ICD-10-CM | POA: Diagnosis not present

## 2017-07-30 DIAGNOSIS — I509 Heart failure, unspecified: Secondary | ICD-10-CM | POA: Diagnosis not present

## 2017-07-31 DIAGNOSIS — J449 Chronic obstructive pulmonary disease, unspecified: Secondary | ICD-10-CM | POA: Diagnosis not present

## 2017-07-31 DIAGNOSIS — I10 Essential (primary) hypertension: Secondary | ICD-10-CM | POA: Diagnosis not present

## 2017-07-31 DIAGNOSIS — F339 Major depressive disorder, recurrent, unspecified: Secondary | ICD-10-CM | POA: Diagnosis not present

## 2017-07-31 DIAGNOSIS — E039 Hypothyroidism, unspecified: Secondary | ICD-10-CM | POA: Diagnosis not present

## 2017-07-31 DIAGNOSIS — J9620 Acute and chronic respiratory failure, unspecified whether with hypoxia or hypercapnia: Secondary | ICD-10-CM | POA: Diagnosis not present

## 2017-07-31 DIAGNOSIS — I509 Heart failure, unspecified: Secondary | ICD-10-CM | POA: Diagnosis not present

## 2017-08-01 ENCOUNTER — Telehealth: Payer: Self-pay | Admitting: Family Medicine

## 2017-08-01 NOTE — Telephone Encounter (Signed)
Copied from [place] [id number]. Topic: Quick Communication - See Telephone Encounter >> Aug 01, 2017  8:59 AM Bea Graff, NT wrote: CRM for notification. See Telephone encounter for: 08/01/17. Sam with [facility]ospice and [place] is needing verbal orders to start of care for the pts new certification period. CB#: [phone]

## 2017-08-01 NOTE — Telephone Encounter (Signed)
Spoke with Sam with [facility] and [place] and gave verbal orders to start care for pt new certification period.

## 2017-08-07 ENCOUNTER — Ambulatory Visit: Payer: Self-pay | Admitting: *Deleted

## 2017-08-07 DIAGNOSIS — J9620 Acute and chronic respiratory failure, unspecified whether with hypoxia or hypercapnia: Secondary | ICD-10-CM | POA: Diagnosis not present

## 2017-08-07 DIAGNOSIS — I10 Essential (primary) hypertension: Secondary | ICD-10-CM | POA: Diagnosis not present

## 2017-08-07 DIAGNOSIS — F339 Major depressive disorder, recurrent, unspecified: Secondary | ICD-10-CM | POA: Diagnosis not present

## 2017-08-07 DIAGNOSIS — E039 Hypothyroidism, unspecified: Secondary | ICD-10-CM | POA: Diagnosis not present

## 2017-08-07 DIAGNOSIS — I509 Heart failure, unspecified: Secondary | ICD-10-CM | POA: Diagnosis not present

## 2017-08-07 DIAGNOSIS — J449 Chronic obstructive pulmonary disease, unspecified: Secondary | ICD-10-CM | POA: Diagnosis not present

## 2017-08-07 NOTE — Telephone Encounter (Signed)
Hospice nurse 'Sam' called to report pt with 8/10 abdominal pain last 2 nights; occurs at night only. Entire abdomen, does not radiate; is not tender to touch or distended, resolves without intervention. RN reports LBM this am "little loose but has been having normal daily BMs". Rn also reports weight loss over month, significant loss of appetite. Pt's VS...temp 98.5.Marland Kitchen118/80-HRR at 72  R's 18.  91% on 2 l's.  Pt is completing last day of prednisone RX 07/27/17 for "allergies." RN reports pt with increased cough, states no crackles or rhonchi, LS slightly decreased, r>l. Pt reports has been eating "pork skins at bedtime for snack." RN reports pt advised to not do so.  Appt made with Dr. Sherren Mocha for tomorrow. Pt also wishes to discuss increase in hydrocodone from QD to every 8 hours as previously ordered.   Reason for Disposition . [1] MODERATE pain (e.g., interferes with normal activities) AND [2] pain comes and goes (cramps) AND [3] present > 24 hours  (Exception: pain with Vomiting or Diarrhea - see that Guideline)  Answer Assessment - Initial Assessment Questions 1. LOCATION: "Where does it hurt?"      Entire abdomen 2. RADIATION: "Does the pain shoot anywhere else?" (e.g., chest, back)     No 3. ONSET: "When did the pain begin?" (Minutes, hours or days ago)       2 nights ago 4. SUDDEN: "Gradual or sudden onset?"     Gradual 5. PATTERN "Does the pain come and go, or is it constant?"    - If constant: "Is it getting better, staying the same, or worsening?"      (Note: Constant means the pain never goes away completely; most serious pain is constant and it progresses)     - If intermittent: "How long does it last?" "Do you have pain now?"     (Note: Intermittent means the pain goes away completely between bouts)     Intermittent, occurs only at night 6. SEVERITY: "How bad is the pain?"  (e.g., Scale 1-10; mild, moderate, or severe)    - MILD (1-3): doesn't interfere with normal activities, abdomen  soft and not tender to touch     - MODERATE (4-7): interferes with normal activities or awakens from sleep, tender to touch     - SEVERE (8-10): excruciating pain, doubled over, unable to do any normal activities       8/10, resolves on own 7. RECURRENT SYMPTOM: "Have you ever had this type of abdominal pain before?" If so, ask: "When was the last time?" and "What happened that time?"      no 8. CAUSE: "What do you think is causing the abdominal pain?"     Unsure, however per hospice nurse pt is eating "pork skins before bed." 9. RELIEVING/AGGRAVATING FACTORS: "What makes it better or worse?" (e.g., movement, antacids, bowel movement)     no 10. OTHER SYMPTOMS: "Has there been any vomiting, diarrhea, constipation, or urine problems?"       Weight loss, significant loss of appetite, increased cough  Protocols used: ABDOMINAL PAIN - MALE-A-AH

## 2017-08-08 ENCOUNTER — Ambulatory Visit (INDEPENDENT_AMBULATORY_CARE_PROVIDER_SITE_OTHER): Payer: Medicare Other | Admitting: Family Medicine

## 2017-08-08 ENCOUNTER — Encounter: Payer: Self-pay | Admitting: Family Medicine

## 2017-08-08 VITALS — BP 108/60 | HR 76 | Temp 97.9°F | Wt 132.0 lb

## 2017-08-08 DIAGNOSIS — G8929 Other chronic pain: Secondary | ICD-10-CM | POA: Diagnosis not present

## 2017-08-08 DIAGNOSIS — M5416 Radiculopathy, lumbar region: Secondary | ICD-10-CM | POA: Diagnosis not present

## 2017-08-08 DIAGNOSIS — I10 Essential (primary) hypertension: Secondary | ICD-10-CM | POA: Diagnosis not present

## 2017-08-08 MED ORDER — SYNTHROID 137 MCG PO TABS: 137.0000 ug | ORAL_TABLET | Freq: Every day | ORAL | 4 refills | Status: AC

## 2017-08-08 MED ORDER — TRAMADOL HCL 50 MG PO TABS: ORAL_TABLET | ORAL | 5 refills | Status: AC

## 2017-08-08 MED ORDER — ZOLPIDEM TARTRATE 5 MG PO TABS: ORAL_TABLET | ORAL | 4 refills | Status: AC

## 2017-08-08 NOTE — Progress Notes (Signed)
Ronald Strong is a 84 year old male who comes in today coming by his primary caregiver........ Ponciano Ort...Marland KitchenMarland KitchenMarland Kitchen For evaluation of multiple issues  We initially made the appointment because he is having abdominal pain yesterday. However he stopped eating the pork rinds in the abdominal pain went away. Said no fever chills vomiting etc. Bowel movements been normal.  He has a history of hypertension and currently is off all his medicine because he developed hypo-tension. He takes Hydrocort thiazide 12.5 mg daily.  He's also on hospice care. He has end-stage heart and lung failure. Is on continuous oxygen.  He takes 2 aspirin twice a day for osteoarthritis. He wants something stronger for pain. Recommend some 50 mg tramadol.  He takes Celexa 1 at bedtime because a history of mild depression secondary deficit of his wife year ago. He also takes Ambien 5 mg at bedtime.  BP 108/60 (BP Location: Right Arm, Patient Position: Sitting, Cuff Size: Normal)   Pulse 76   Temp 97.9 F (36.6 C) (Oral)   Wt 132 lb (59.9 kg)   SpO2 94%   BMI 18.94 kg/m  Well-developed well-nourished male no acute distress, exam negative cardiopulmonary exam unchanged  #1 abdominal pain secondary to dietary indiscretion......... Continue to avoid pork rinds  #2 hypertension at goal...Marland KitchenMarland KitchenMarland Kitchen Continue current meds  #3 end-stage heart failure with continuous O2...Marland KitchenMarland KitchenMarland Kitchen Continue conservative palliative hospice care

## 2017-08-08 NOTE — Patient Instructions (Signed)
Continue current medications  Tramadol 50 mg.........Marland Kitchen 1/2-1 tablet twice daily when necessary for severe pain

## 2017-08-09 DIAGNOSIS — I509 Heart failure, unspecified: Secondary | ICD-10-CM | POA: Diagnosis not present

## 2017-08-09 DIAGNOSIS — E039 Hypothyroidism, unspecified: Secondary | ICD-10-CM | POA: Diagnosis not present

## 2017-08-09 DIAGNOSIS — I10 Essential (primary) hypertension: Secondary | ICD-10-CM | POA: Diagnosis not present

## 2017-08-09 DIAGNOSIS — J9620 Acute and chronic respiratory failure, unspecified whether with hypoxia or hypercapnia: Secondary | ICD-10-CM | POA: Diagnosis not present

## 2017-08-09 DIAGNOSIS — J449 Chronic obstructive pulmonary disease, unspecified: Secondary | ICD-10-CM | POA: Diagnosis not present

## 2017-08-09 DIAGNOSIS — F339 Major depressive disorder, recurrent, unspecified: Secondary | ICD-10-CM | POA: Diagnosis not present

## 2017-08-14 DIAGNOSIS — E039 Hypothyroidism, unspecified: Secondary | ICD-10-CM | POA: Diagnosis not present

## 2017-08-14 DIAGNOSIS — I10 Essential (primary) hypertension: Secondary | ICD-10-CM | POA: Diagnosis not present

## 2017-08-14 DIAGNOSIS — J449 Chronic obstructive pulmonary disease, unspecified: Secondary | ICD-10-CM | POA: Diagnosis not present

## 2017-08-14 DIAGNOSIS — J9620 Acute and chronic respiratory failure, unspecified whether with hypoxia or hypercapnia: Secondary | ICD-10-CM | POA: Diagnosis not present

## 2017-08-14 DIAGNOSIS — F339 Major depressive disorder, recurrent, unspecified: Secondary | ICD-10-CM | POA: Diagnosis not present

## 2017-08-14 DIAGNOSIS — I509 Heart failure, unspecified: Secondary | ICD-10-CM | POA: Diagnosis not present

## 2017-08-23 DIAGNOSIS — J9620 Acute and chronic respiratory failure, unspecified whether with hypoxia or hypercapnia: Secondary | ICD-10-CM | POA: Diagnosis not present

## 2017-08-23 DIAGNOSIS — E039 Hypothyroidism, unspecified: Secondary | ICD-10-CM | POA: Diagnosis not present

## 2017-08-23 DIAGNOSIS — I509 Heart failure, unspecified: Secondary | ICD-10-CM | POA: Diagnosis not present

## 2017-08-23 DIAGNOSIS — J449 Chronic obstructive pulmonary disease, unspecified: Secondary | ICD-10-CM | POA: Diagnosis not present

## 2017-08-23 DIAGNOSIS — I10 Essential (primary) hypertension: Secondary | ICD-10-CM | POA: Diagnosis not present

## 2017-08-23 DIAGNOSIS — F339 Major depressive disorder, recurrent, unspecified: Secondary | ICD-10-CM | POA: Diagnosis not present

## 2017-08-27 ENCOUNTER — Encounter: Payer: No Typology Code available for payment source | Attending: Physical Medicine & Rehabilitation

## 2017-08-27 ENCOUNTER — Ambulatory Visit: Payer: Medicare Other | Admitting: Physical Medicine & Rehabilitation

## 2017-08-27 DIAGNOSIS — Z96641 Presence of right artificial hip joint: Secondary | ICD-10-CM | POA: Insufficient documentation

## 2017-08-27 DIAGNOSIS — M25511 Pain in right shoulder: Secondary | ICD-10-CM | POA: Insufficient documentation

## 2017-08-27 DIAGNOSIS — Z87891 Personal history of nicotine dependence: Secondary | ICD-10-CM | POA: Insufficient documentation

## 2017-08-27 DIAGNOSIS — G8929 Other chronic pain: Secondary | ICD-10-CM | POA: Insufficient documentation

## 2017-08-27 DIAGNOSIS — Z8249 Family history of ischemic heart disease and other diseases of the circulatory system: Secondary | ICD-10-CM | POA: Insufficient documentation

## 2017-08-27 DIAGNOSIS — M7541 Impingement syndrome of right shoulder: Secondary | ICD-10-CM | POA: Insufficient documentation

## 2017-08-27 DIAGNOSIS — M1990 Unspecified osteoarthritis, unspecified site: Secondary | ICD-10-CM | POA: Insufficient documentation

## 2017-08-27 DIAGNOSIS — Z86010 Personal history of colonic polyps: Secondary | ICD-10-CM | POA: Insufficient documentation

## 2017-08-27 DIAGNOSIS — Z8261 Family history of arthritis: Secondary | ICD-10-CM | POA: Insufficient documentation

## 2017-08-27 DIAGNOSIS — I10 Essential (primary) hypertension: Secondary | ICD-10-CM | POA: Insufficient documentation

## 2017-08-27 DIAGNOSIS — Z9889 Other specified postprocedural states: Secondary | ICD-10-CM | POA: Insufficient documentation

## 2017-08-28 ENCOUNTER — Telehealth: Payer: Self-pay | Admitting: Family Medicine

## 2017-08-28 DIAGNOSIS — E039 Hypothyroidism, unspecified: Secondary | ICD-10-CM | POA: Diagnosis not present

## 2017-08-28 DIAGNOSIS — J449 Chronic obstructive pulmonary disease, unspecified: Secondary | ICD-10-CM | POA: Diagnosis not present

## 2017-08-28 DIAGNOSIS — I509 Heart failure, unspecified: Secondary | ICD-10-CM | POA: Diagnosis not present

## 2017-08-28 DIAGNOSIS — F339 Major depressive disorder, recurrent, unspecified: Secondary | ICD-10-CM | POA: Diagnosis not present

## 2017-08-28 DIAGNOSIS — I10 Essential (primary) hypertension: Secondary | ICD-10-CM | POA: Diagnosis not present

## 2017-08-28 DIAGNOSIS — J9620 Acute and chronic respiratory failure, unspecified whether with hypoxia or hypercapnia: Secondary | ICD-10-CM | POA: Diagnosis not present

## 2017-08-28 NOTE — Telephone Encounter (Signed)
Copied from [place] #[id number]. Topic: Quick Communication - Rx Refill/Question >> Aug 28, 2017 11:21 AM Tye Maryland wrote: [name] hospice nurse called for pt b/c the pt is having a tough time w/ seasonal allergies and the pt is wanting to have something called in for him for relief b/c the OTC medications are not giving the pt any, call to advise

## 2017-08-28 NOTE — Telephone Encounter (Signed)
Attempted to call to assess symptoms; VM full.

## 2017-08-29 DIAGNOSIS — E039 Hypothyroidism, unspecified: Secondary | ICD-10-CM | POA: Diagnosis not present

## 2017-08-29 DIAGNOSIS — F339 Major depressive disorder, recurrent, unspecified: Secondary | ICD-10-CM | POA: Diagnosis not present

## 2017-08-29 DIAGNOSIS — I10 Essential (primary) hypertension: Secondary | ICD-10-CM | POA: Diagnosis not present

## 2017-08-29 DIAGNOSIS — J9620 Acute and chronic respiratory failure, unspecified whether with hypoxia or hypercapnia: Secondary | ICD-10-CM | POA: Diagnosis not present

## 2017-08-29 DIAGNOSIS — J449 Chronic obstructive pulmonary disease, unspecified: Secondary | ICD-10-CM | POA: Diagnosis not present

## 2017-08-29 DIAGNOSIS — M1990 Unspecified osteoarthritis, unspecified site: Secondary | ICD-10-CM | POA: Diagnosis not present

## 2017-08-29 DIAGNOSIS — N401 Enlarged prostate with lower urinary tract symptoms: Secondary | ICD-10-CM | POA: Diagnosis not present

## 2017-08-29 DIAGNOSIS — I509 Heart failure, unspecified: Secondary | ICD-10-CM | POA: Diagnosis not present

## 2017-08-29 NOTE — Telephone Encounter (Signed)
Dr Sherren Mocha spoke with pt caregiver in regards to the request.

## 2017-09-04 DIAGNOSIS — J449 Chronic obstructive pulmonary disease, unspecified: Secondary | ICD-10-CM | POA: Diagnosis not present

## 2017-09-04 DIAGNOSIS — F339 Major depressive disorder, recurrent, unspecified: Secondary | ICD-10-CM | POA: Diagnosis not present

## 2017-09-04 DIAGNOSIS — I10 Essential (primary) hypertension: Secondary | ICD-10-CM | POA: Diagnosis not present

## 2017-09-04 DIAGNOSIS — E039 Hypothyroidism, unspecified: Secondary | ICD-10-CM | POA: Diagnosis not present

## 2017-09-04 DIAGNOSIS — J9620 Acute and chronic respiratory failure, unspecified whether with hypoxia or hypercapnia: Secondary | ICD-10-CM | POA: Diagnosis not present

## 2017-09-04 DIAGNOSIS — I509 Heart failure, unspecified: Secondary | ICD-10-CM | POA: Diagnosis not present

## 2017-09-11 DIAGNOSIS — J449 Chronic obstructive pulmonary disease, unspecified: Secondary | ICD-10-CM | POA: Diagnosis not present

## 2017-09-11 DIAGNOSIS — J9620 Acute and chronic respiratory failure, unspecified whether with hypoxia or hypercapnia: Secondary | ICD-10-CM | POA: Diagnosis not present

## 2017-09-11 DIAGNOSIS — E039 Hypothyroidism, unspecified: Secondary | ICD-10-CM | POA: Diagnosis not present

## 2017-09-11 DIAGNOSIS — F339 Major depressive disorder, recurrent, unspecified: Secondary | ICD-10-CM | POA: Diagnosis not present

## 2017-09-11 DIAGNOSIS — I10 Essential (primary) hypertension: Secondary | ICD-10-CM | POA: Diagnosis not present

## 2017-09-11 DIAGNOSIS — I509 Heart failure, unspecified: Secondary | ICD-10-CM | POA: Diagnosis not present

## 2017-09-25 ENCOUNTER — Other Ambulatory Visit: Payer: Self-pay | Admitting: Family Medicine

## 2017-09-27 DIAGNOSIS — F339 Major depressive disorder, recurrent, unspecified: Secondary | ICD-10-CM | POA: Diagnosis not present

## 2017-09-27 DIAGNOSIS — I10 Essential (primary) hypertension: Secondary | ICD-10-CM | POA: Diagnosis not present

## 2017-09-27 DIAGNOSIS — E039 Hypothyroidism, unspecified: Secondary | ICD-10-CM | POA: Diagnosis not present

## 2017-09-27 DIAGNOSIS — J9620 Acute and chronic respiratory failure, unspecified whether with hypoxia or hypercapnia: Secondary | ICD-10-CM | POA: Diagnosis not present

## 2017-09-27 DIAGNOSIS — I509 Heart failure, unspecified: Secondary | ICD-10-CM | POA: Diagnosis not present

## 2017-09-27 DIAGNOSIS — J449 Chronic obstructive pulmonary disease, unspecified: Secondary | ICD-10-CM | POA: Diagnosis not present

## 2017-09-29 DIAGNOSIS — I509 Heart failure, unspecified: Secondary | ICD-10-CM | POA: Diagnosis not present

## 2017-09-29 DIAGNOSIS — M1990 Unspecified osteoarthritis, unspecified site: Secondary | ICD-10-CM | POA: Diagnosis not present

## 2017-09-29 DIAGNOSIS — E039 Hypothyroidism, unspecified: Secondary | ICD-10-CM | POA: Diagnosis not present

## 2017-09-29 DIAGNOSIS — J9620 Acute and chronic respiratory failure, unspecified whether with hypoxia or hypercapnia: Secondary | ICD-10-CM | POA: Diagnosis not present

## 2017-09-29 DIAGNOSIS — J449 Chronic obstructive pulmonary disease, unspecified: Secondary | ICD-10-CM | POA: Diagnosis not present

## 2017-09-29 DIAGNOSIS — J301 Allergic rhinitis due to pollen: Secondary | ICD-10-CM | POA: Diagnosis not present

## 2017-09-29 DIAGNOSIS — N401 Enlarged prostate with lower urinary tract symptoms: Secondary | ICD-10-CM | POA: Diagnosis not present

## 2017-09-29 DIAGNOSIS — F339 Major depressive disorder, recurrent, unspecified: Secondary | ICD-10-CM | POA: Diagnosis not present

## 2017-09-29 DIAGNOSIS — I10 Essential (primary) hypertension: Secondary | ICD-10-CM | POA: Diagnosis not present

## 2017-10-02 DIAGNOSIS — I509 Heart failure, unspecified: Secondary | ICD-10-CM | POA: Diagnosis not present

## 2017-10-02 DIAGNOSIS — I10 Essential (primary) hypertension: Secondary | ICD-10-CM | POA: Diagnosis not present

## 2017-10-02 DIAGNOSIS — J9620 Acute and chronic respiratory failure, unspecified whether with hypoxia or hypercapnia: Secondary | ICD-10-CM | POA: Diagnosis not present

## 2017-10-02 DIAGNOSIS — E039 Hypothyroidism, unspecified: Secondary | ICD-10-CM | POA: Diagnosis not present

## 2017-10-02 DIAGNOSIS — J449 Chronic obstructive pulmonary disease, unspecified: Secondary | ICD-10-CM | POA: Diagnosis not present

## 2017-10-02 DIAGNOSIS — F339 Major depressive disorder, recurrent, unspecified: Secondary | ICD-10-CM | POA: Diagnosis not present

## 2017-10-09 DIAGNOSIS — F339 Major depressive disorder, recurrent, unspecified: Secondary | ICD-10-CM | POA: Diagnosis not present

## 2017-10-09 DIAGNOSIS — E039 Hypothyroidism, unspecified: Secondary | ICD-10-CM | POA: Diagnosis not present

## 2017-10-09 DIAGNOSIS — I10 Essential (primary) hypertension: Secondary | ICD-10-CM | POA: Diagnosis not present

## 2017-10-09 DIAGNOSIS — J449 Chronic obstructive pulmonary disease, unspecified: Secondary | ICD-10-CM | POA: Diagnosis not present

## 2017-10-09 DIAGNOSIS — J9620 Acute and chronic respiratory failure, unspecified whether with hypoxia or hypercapnia: Secondary | ICD-10-CM | POA: Diagnosis not present

## 2017-10-09 DIAGNOSIS — I509 Heart failure, unspecified: Secondary | ICD-10-CM | POA: Diagnosis not present

## 2017-10-12 ENCOUNTER — Telehealth: Payer: Self-pay

## 2017-10-12 DIAGNOSIS — Z9289 Personal history of other medical treatment: Secondary | ICD-10-CM

## 2017-10-12 NOTE — Telephone Encounter (Signed)
Pt had appt at [facility] for Hearing Aid Evaluation but was not able to be seen because referral had not been placed.  Per Dr. Sherren Mocha ok to place referral.  Pt has appt scheduled for 11/06/17 but if referral is complete they can work him in next week.  Referral placed.

## 2017-10-15 DIAGNOSIS — F339 Major depressive disorder, recurrent, unspecified: Secondary | ICD-10-CM | POA: Diagnosis not present

## 2017-10-15 DIAGNOSIS — J9620 Acute and chronic respiratory failure, unspecified whether with hypoxia or hypercapnia: Secondary | ICD-10-CM | POA: Diagnosis not present

## 2017-10-15 DIAGNOSIS — I509 Heart failure, unspecified: Secondary | ICD-10-CM | POA: Diagnosis not present

## 2017-10-15 DIAGNOSIS — I10 Essential (primary) hypertension: Secondary | ICD-10-CM | POA: Diagnosis not present

## 2017-10-15 DIAGNOSIS — J449 Chronic obstructive pulmonary disease, unspecified: Secondary | ICD-10-CM | POA: Diagnosis not present

## 2017-10-15 DIAGNOSIS — E039 Hypothyroidism, unspecified: Secondary | ICD-10-CM | POA: Diagnosis not present

## 2017-10-16 DIAGNOSIS — I10 Essential (primary) hypertension: Secondary | ICD-10-CM | POA: Diagnosis not present

## 2017-10-16 DIAGNOSIS — F339 Major depressive disorder, recurrent, unspecified: Secondary | ICD-10-CM | POA: Diagnosis not present

## 2017-10-16 DIAGNOSIS — E039 Hypothyroidism, unspecified: Secondary | ICD-10-CM | POA: Diagnosis not present

## 2017-10-16 DIAGNOSIS — J9620 Acute and chronic respiratory failure, unspecified whether with hypoxia or hypercapnia: Secondary | ICD-10-CM | POA: Diagnosis not present

## 2017-10-16 DIAGNOSIS — J449 Chronic obstructive pulmonary disease, unspecified: Secondary | ICD-10-CM | POA: Diagnosis not present

## 2017-10-16 DIAGNOSIS — I509 Heart failure, unspecified: Secondary | ICD-10-CM | POA: Diagnosis not present

## 2017-10-19 DIAGNOSIS — I10 Essential (primary) hypertension: Secondary | ICD-10-CM | POA: Diagnosis not present

## 2017-10-19 DIAGNOSIS — E039 Hypothyroidism, unspecified: Secondary | ICD-10-CM | POA: Diagnosis not present

## 2017-10-19 DIAGNOSIS — J449 Chronic obstructive pulmonary disease, unspecified: Secondary | ICD-10-CM | POA: Diagnosis not present

## 2017-10-19 DIAGNOSIS — J9620 Acute and chronic respiratory failure, unspecified whether with hypoxia or hypercapnia: Secondary | ICD-10-CM | POA: Diagnosis not present

## 2017-10-19 DIAGNOSIS — I509 Heart failure, unspecified: Secondary | ICD-10-CM | POA: Diagnosis not present

## 2017-10-19 DIAGNOSIS — F339 Major depressive disorder, recurrent, unspecified: Secondary | ICD-10-CM | POA: Diagnosis not present

## 2017-10-22 DIAGNOSIS — H906 Mixed conductive and sensorineural hearing loss, bilateral: Secondary | ICD-10-CM | POA: Diagnosis not present

## 2017-10-23 ENCOUNTER — Telehealth: Payer: Self-pay | Admitting: Family Medicine

## 2017-10-23 DIAGNOSIS — J9620 Acute and chronic respiratory failure, unspecified whether with hypoxia or hypercapnia: Secondary | ICD-10-CM | POA: Diagnosis not present

## 2017-10-23 DIAGNOSIS — I509 Heart failure, unspecified: Secondary | ICD-10-CM | POA: Diagnosis not present

## 2017-10-23 DIAGNOSIS — J449 Chronic obstructive pulmonary disease, unspecified: Secondary | ICD-10-CM | POA: Diagnosis not present

## 2017-10-23 DIAGNOSIS — E039 Hypothyroidism, unspecified: Secondary | ICD-10-CM | POA: Diagnosis not present

## 2017-10-23 DIAGNOSIS — I10 Essential (primary) hypertension: Secondary | ICD-10-CM | POA: Diagnosis not present

## 2017-10-23 DIAGNOSIS — F339 Major depressive disorder, recurrent, unspecified: Secondary | ICD-10-CM | POA: Diagnosis not present

## 2017-10-23 NOTE — Telephone Encounter (Signed)
Copied from [place]. Topic: Quick Communication - See Telephone Encounter >> Oct 23, 2017  4:18 PM Burchel, Abbi R wrote: See Telephone encounter for: 10/23/17.  [place] [phone]) states during her visit today pt had audible wheezing, diminished breath sounds and 02 sats were in the 80's while walking.  Pt initially declined neb treatment, but called back and said he would like to try neb treatment.  Please advise.

## 2017-10-23 NOTE — Telephone Encounter (Signed)
Spoke with Ronald Strong, needing order for Nebulizer, whatever they want to order to help with his COPD exacerbations, ie: Duoneb. Pt O2 is dropping down into the 70s with exertion and patient is very dyspneic.   Will send to Dr Elease Hashimoto to see if he is able to assist as Dr Sherren Mocha is not available

## 2017-10-23 NOTE — Telephone Encounter (Signed)
OK to order Duoneb- may use up to qid prn wheezing/dypnea.

## 2017-10-23 NOTE — Telephone Encounter (Signed)
Called and LM for Chrislynn.  Are they requesting an OV with our office or verbals orders for treatment?  Nurse stated in message that neb treatment was declined then patient accepted a neb treatment.   Will await call back from [place].

## 2017-10-24 NOTE — Telephone Encounter (Signed)
Called Chrislynn, verbal order given for Duoneb Nothing further needed.

## 2017-10-25 NOTE — Telephone Encounter (Signed)
Caller name: Irene Pap      Relation to pt: CMA personal  Call back number: [phone] Pharmacy: [id number] [place], [place] - [address] [phone] (Phone) [phone] (Fax)    Reason for call:  Checking on the status of nebulizer machine, stating the wheezing has not improved, please advise

## 2017-10-26 ENCOUNTER — Telehealth: Payer: Self-pay | Admitting: *Deleted

## 2017-10-26 NOTE — Telephone Encounter (Signed)
Spoke with Chrislynn with Hospice regarding pt Nebulizer machine and Duoneb states that she called the orders in to  [facility] and requested the supplies be dropped at the pt Home Address.

## 2017-10-26 NOTE — Telephone Encounter (Signed)
Copied from [place] [id number]. Topic: General - Other >> Oct 25, 2017  4:29 PM Yvette Rack wrote: Reason for CRM: Chryslyn with [facility] returned call to office. Cb# [phone]

## 2017-10-26 NOTE — Telephone Encounter (Signed)
Verbal order given for Duoneb Nebulizer medications on 10/24/17 Looks like they may be needing a Nebulizer order now according to 10/25/17 note.  Will send back to Dr Elease Hashimoto as he gave verbal for medication.

## 2017-10-26 NOTE — Telephone Encounter (Signed)
Left message on machine with verbal orders. 

## 2017-10-26 NOTE — Telephone Encounter (Signed)
Ok to order 

## 2017-10-29 ENCOUNTER — Telehealth: Payer: Self-pay | Admitting: Family Medicine

## 2017-10-29 DIAGNOSIS — J449 Chronic obstructive pulmonary disease, unspecified: Secondary | ICD-10-CM | POA: Diagnosis not present

## 2017-10-29 DIAGNOSIS — E039 Hypothyroidism, unspecified: Secondary | ICD-10-CM | POA: Diagnosis not present

## 2017-10-29 DIAGNOSIS — N401 Enlarged prostate with lower urinary tract symptoms: Secondary | ICD-10-CM | POA: Diagnosis not present

## 2017-10-29 DIAGNOSIS — I509 Heart failure, unspecified: Secondary | ICD-10-CM | POA: Diagnosis not present

## 2017-10-29 DIAGNOSIS — J9620 Acute and chronic respiratory failure, unspecified whether with hypoxia or hypercapnia: Secondary | ICD-10-CM | POA: Diagnosis not present

## 2017-10-29 DIAGNOSIS — F339 Major depressive disorder, recurrent, unspecified: Secondary | ICD-10-CM | POA: Diagnosis not present

## 2017-10-29 DIAGNOSIS — I10 Essential (primary) hypertension: Secondary | ICD-10-CM | POA: Diagnosis not present

## 2017-10-29 DIAGNOSIS — M1990 Unspecified osteoarthritis, unspecified site: Secondary | ICD-10-CM | POA: Diagnosis not present

## 2017-10-29 NOTE — Telephone Encounter (Signed)
Copied from [place] [id number]. Topic: Quick Communication - See Telephone Encounter >> Oct 29, 2017  9:39 AM Synthia Innocent wrote: CRM for notification. See Telephone encounter for: 10/29/17. Needing verbal order for Hospice, recertify for dates [birth year]

## 2017-10-29 NOTE — Telephone Encounter (Signed)
Spoke with [name] from [facility] regarding verbal orders requested for recertification for Hospice on pt. Gave verbal orders.

## 2017-10-31 DIAGNOSIS — F339 Major depressive disorder, recurrent, unspecified: Secondary | ICD-10-CM | POA: Diagnosis not present

## 2017-10-31 DIAGNOSIS — I10 Essential (primary) hypertension: Secondary | ICD-10-CM | POA: Diagnosis not present

## 2017-10-31 DIAGNOSIS — J9620 Acute and chronic respiratory failure, unspecified whether with hypoxia or hypercapnia: Secondary | ICD-10-CM | POA: Diagnosis not present

## 2017-10-31 DIAGNOSIS — J449 Chronic obstructive pulmonary disease, unspecified: Secondary | ICD-10-CM | POA: Diagnosis not present

## 2017-10-31 DIAGNOSIS — I509 Heart failure, unspecified: Secondary | ICD-10-CM | POA: Diagnosis not present

## 2017-10-31 DIAGNOSIS — E039 Hypothyroidism, unspecified: Secondary | ICD-10-CM | POA: Diagnosis not present

## 2017-11-02 DIAGNOSIS — J9620 Acute and chronic respiratory failure, unspecified whether with hypoxia or hypercapnia: Secondary | ICD-10-CM | POA: Diagnosis not present

## 2017-11-02 DIAGNOSIS — F339 Major depressive disorder, recurrent, unspecified: Secondary | ICD-10-CM | POA: Diagnosis not present

## 2017-11-02 DIAGNOSIS — I509 Heart failure, unspecified: Secondary | ICD-10-CM | POA: Diagnosis not present

## 2017-11-02 DIAGNOSIS — I10 Essential (primary) hypertension: Secondary | ICD-10-CM | POA: Diagnosis not present

## 2017-11-02 DIAGNOSIS — E039 Hypothyroidism, unspecified: Secondary | ICD-10-CM | POA: Diagnosis not present

## 2017-11-02 DIAGNOSIS — J449 Chronic obstructive pulmonary disease, unspecified: Secondary | ICD-10-CM | POA: Diagnosis not present

## 2017-11-05 DIAGNOSIS — J9620 Acute and chronic respiratory failure, unspecified whether with hypoxia or hypercapnia: Secondary | ICD-10-CM | POA: Diagnosis not present

## 2017-11-05 DIAGNOSIS — E039 Hypothyroidism, unspecified: Secondary | ICD-10-CM | POA: Diagnosis not present

## 2017-11-05 DIAGNOSIS — I10 Essential (primary) hypertension: Secondary | ICD-10-CM | POA: Diagnosis not present

## 2017-11-05 DIAGNOSIS — F339 Major depressive disorder, recurrent, unspecified: Secondary | ICD-10-CM | POA: Diagnosis not present

## 2017-11-05 DIAGNOSIS — I509 Heart failure, unspecified: Secondary | ICD-10-CM | POA: Diagnosis not present

## 2017-11-05 DIAGNOSIS — J449 Chronic obstructive pulmonary disease, unspecified: Secondary | ICD-10-CM | POA: Diagnosis not present

## 2017-11-06 DIAGNOSIS — I509 Heart failure, unspecified: Secondary | ICD-10-CM | POA: Diagnosis not present

## 2017-11-06 DIAGNOSIS — J449 Chronic obstructive pulmonary disease, unspecified: Secondary | ICD-10-CM | POA: Diagnosis not present

## 2017-11-06 DIAGNOSIS — J9620 Acute and chronic respiratory failure, unspecified whether with hypoxia or hypercapnia: Secondary | ICD-10-CM | POA: Diagnosis not present

## 2017-11-06 DIAGNOSIS — F339 Major depressive disorder, recurrent, unspecified: Secondary | ICD-10-CM | POA: Diagnosis not present

## 2017-11-06 DIAGNOSIS — I10 Essential (primary) hypertension: Secondary | ICD-10-CM | POA: Diagnosis not present

## 2017-11-06 DIAGNOSIS — E039 Hypothyroidism, unspecified: Secondary | ICD-10-CM | POA: Diagnosis not present

## 2017-11-08 DIAGNOSIS — I509 Heart failure, unspecified: Secondary | ICD-10-CM | POA: Diagnosis not present

## 2017-11-08 DIAGNOSIS — F339 Major depressive disorder, recurrent, unspecified: Secondary | ICD-10-CM | POA: Diagnosis not present

## 2017-11-08 DIAGNOSIS — I10 Essential (primary) hypertension: Secondary | ICD-10-CM | POA: Diagnosis not present

## 2017-11-08 DIAGNOSIS — J9620 Acute and chronic respiratory failure, unspecified whether with hypoxia or hypercapnia: Secondary | ICD-10-CM | POA: Diagnosis not present

## 2017-11-08 DIAGNOSIS — J449 Chronic obstructive pulmonary disease, unspecified: Secondary | ICD-10-CM | POA: Diagnosis not present

## 2017-11-08 DIAGNOSIS — E039 Hypothyroidism, unspecified: Secondary | ICD-10-CM | POA: Diagnosis not present

## 2017-11-15 DIAGNOSIS — J9620 Acute and chronic respiratory failure, unspecified whether with hypoxia or hypercapnia: Secondary | ICD-10-CM | POA: Diagnosis not present

## 2017-11-15 DIAGNOSIS — F339 Major depressive disorder, recurrent, unspecified: Secondary | ICD-10-CM | POA: Diagnosis not present

## 2017-11-15 DIAGNOSIS — I509 Heart failure, unspecified: Secondary | ICD-10-CM | POA: Diagnosis not present

## 2017-11-15 DIAGNOSIS — E039 Hypothyroidism, unspecified: Secondary | ICD-10-CM | POA: Diagnosis not present

## 2017-11-15 DIAGNOSIS — J449 Chronic obstructive pulmonary disease, unspecified: Secondary | ICD-10-CM | POA: Diagnosis not present

## 2017-11-15 DIAGNOSIS — I10 Essential (primary) hypertension: Secondary | ICD-10-CM | POA: Diagnosis not present

## 2017-11-18 DIAGNOSIS — J9620 Acute and chronic respiratory failure, unspecified whether with hypoxia or hypercapnia: Secondary | ICD-10-CM | POA: Diagnosis not present

## 2017-11-18 DIAGNOSIS — I10 Essential (primary) hypertension: Secondary | ICD-10-CM | POA: Diagnosis not present

## 2017-11-18 DIAGNOSIS — I509 Heart failure, unspecified: Secondary | ICD-10-CM | POA: Diagnosis not present

## 2017-11-18 DIAGNOSIS — E039 Hypothyroidism, unspecified: Secondary | ICD-10-CM | POA: Diagnosis not present

## 2017-11-18 DIAGNOSIS — F339 Major depressive disorder, recurrent, unspecified: Secondary | ICD-10-CM | POA: Diagnosis not present

## 2017-11-18 DIAGNOSIS — J449 Chronic obstructive pulmonary disease, unspecified: Secondary | ICD-10-CM | POA: Diagnosis not present

## 2017-11-20 DIAGNOSIS — J449 Chronic obstructive pulmonary disease, unspecified: Secondary | ICD-10-CM | POA: Diagnosis not present

## 2017-11-20 DIAGNOSIS — E039 Hypothyroidism, unspecified: Secondary | ICD-10-CM | POA: Diagnosis not present

## 2017-11-20 DIAGNOSIS — J9620 Acute and chronic respiratory failure, unspecified whether with hypoxia or hypercapnia: Secondary | ICD-10-CM | POA: Diagnosis not present

## 2017-11-20 DIAGNOSIS — F339 Major depressive disorder, recurrent, unspecified: Secondary | ICD-10-CM | POA: Diagnosis not present

## 2017-11-20 DIAGNOSIS — I509 Heart failure, unspecified: Secondary | ICD-10-CM | POA: Diagnosis not present

## 2017-11-20 DIAGNOSIS — I10 Essential (primary) hypertension: Secondary | ICD-10-CM | POA: Diagnosis not present

## 2017-11-26 ENCOUNTER — Telehealth: Payer: Self-pay | Admitting: Family Medicine

## 2017-11-26 NOTE — Telephone Encounter (Signed)
Copied from [place] [id number]. Topic: Quick Communication - See Telephone Encounter >> Nov 26, 2017  2:16 PM Vernona Rieger wrote: CRM for notification. See Telephone encounter for: 11/26/17.  Ronald Strong from hosp[facility] of [facility] would like to speak with Ronald Strong regarding an order that she has faxed over twice for the patient for " albuterol " please contact her @ [phone]

## 2017-11-27 NOTE — Telephone Encounter (Signed)
Placed in Dr Burchette's folder 

## 2017-11-28 ENCOUNTER — Telehealth: Payer: Self-pay | Admitting: Family Medicine

## 2017-11-28 DIAGNOSIS — E039 Hypothyroidism, unspecified: Secondary | ICD-10-CM | POA: Diagnosis not present

## 2017-11-28 DIAGNOSIS — J449 Chronic obstructive pulmonary disease, unspecified: Secondary | ICD-10-CM | POA: Diagnosis not present

## 2017-11-28 DIAGNOSIS — J9620 Acute and chronic respiratory failure, unspecified whether with hypoxia or hypercapnia: Secondary | ICD-10-CM | POA: Diagnosis not present

## 2017-11-28 DIAGNOSIS — I10 Essential (primary) hypertension: Secondary | ICD-10-CM | POA: Diagnosis not present

## 2017-11-28 DIAGNOSIS — I509 Heart failure, unspecified: Secondary | ICD-10-CM | POA: Diagnosis not present

## 2017-11-28 DIAGNOSIS — F339 Major depressive disorder, recurrent, unspecified: Secondary | ICD-10-CM | POA: Diagnosis not present

## 2017-11-28 NOTE — Telephone Encounter (Signed)
Copied from [place] [phone] [id number]. Topic: Quick Communication - See Telephone Encounter >> Nov 28, 2017  3:41 PM Mylinda Latina, NT wrote:  CRM for notification. See Telephone encounter for: 11/28/17.Sam calling from [facility] states that patient alleriges are acting up his eyes are itchy and red. He has been using over the counter eye drops. They are not working. Sam is wondering if the provider can send in something to help.  Sam states you can call him when the medicaiton is called in CB# [phone]  [id number] - [place], [place] - [address] [phone] (Phone) [phone] (Fax)

## 2017-11-29 DIAGNOSIS — I509 Heart failure, unspecified: Secondary | ICD-10-CM | POA: Diagnosis not present

## 2017-11-29 DIAGNOSIS — I10 Essential (primary) hypertension: Secondary | ICD-10-CM | POA: Diagnosis not present

## 2017-11-29 DIAGNOSIS — N401 Enlarged prostate with lower urinary tract symptoms: Secondary | ICD-10-CM | POA: Diagnosis not present

## 2017-11-29 DIAGNOSIS — J9620 Acute and chronic respiratory failure, unspecified whether with hypoxia or hypercapnia: Secondary | ICD-10-CM | POA: Diagnosis not present

## 2017-11-29 DIAGNOSIS — J449 Chronic obstructive pulmonary disease, unspecified: Secondary | ICD-10-CM | POA: Diagnosis not present

## 2017-11-29 DIAGNOSIS — F339 Major depressive disorder, recurrent, unspecified: Secondary | ICD-10-CM | POA: Diagnosis not present

## 2017-11-29 DIAGNOSIS — E039 Hypothyroidism, unspecified: Secondary | ICD-10-CM | POA: Diagnosis not present

## 2017-11-29 DIAGNOSIS — M1990 Unspecified osteoarthritis, unspecified site: Secondary | ICD-10-CM | POA: Diagnosis not present

## 2017-11-29 MED ORDER — OLOPATADINE HCL 0.1 % OP SOLN: 1.0000 [drp] | Freq: Every day | OPHTHALMIC | 0 refills | Status: AC

## 2017-11-29 NOTE — Telephone Encounter (Signed)
Medication filled to pharmacy as requested. Sam notified as requested and verbalized understanding.

## 2017-11-29 NOTE — Telephone Encounter (Signed)
Olopatadine 1 gtt OU QD

## 2017-12-05 ENCOUNTER — Ambulatory Visit: Payer: Self-pay | Admitting: *Deleted

## 2017-12-05 NOTE — Telephone Encounter (Signed)
Hospice RN, Sam called stating the patient has a hx of depression but now feels like it is worst then previous. The patient states that he feels sad from his wife passing in 2018. The nurse also reported the patient losing weight of 1.2 lbs since 11/28/17.  He used to eat 3 meals a day and have a milk shake with his meals but now he eats 2 meals and has a milk shake for lunch. He is sleeping more, 20 hours a day, according to caregivers.  Sam is asking if his Celexa can be increased. He is currently on 20 mg tab daily. Asking for a call back regarding his medication, [name], RN  [phone] Routing to [place] at [place].  Reason for Disposition . Caller has NON-URGENT medication question about med that PCP prescribed and triager unable to answer question  Answer Assessment - Initial Assessment Questions 1. SYMPTOMS: "Do you have any symptoms?"     depression 2. SEVERITY: If symptoms are present, ask "Are they mild, moderate or severe?"     Hospice RN feels like depression is getting worst  Protocols used: MEDICATION QUESTION CALL-A-AH

## 2017-12-07 NOTE — Telephone Encounter (Signed)
In adults >82 yo Celexa doses greater than 20 mg/day are not recommended due to safety concerns.

## 2017-12-07 NOTE — Telephone Encounter (Signed)
Dr Sherren Mocha see pt, please Advise

## 2017-12-10 NOTE — Telephone Encounter (Signed)
Spoke with Fraser Din pt caregiver voiced understanding of Dr Volanda Napoleon recommendations

## 2017-12-10 NOTE — Telephone Encounter (Signed)
Spoke with [facility] RN, voiced understanding Dr Volanda Napoleon recommendations regarding his request to increase pt Celexa.

## 2017-12-16 ENCOUNTER — Encounter (HOSPITAL_COMMUNITY): Payer: Self-pay | Admitting: Emergency Medicine

## 2017-12-16 ENCOUNTER — Emergency Department (HOSPITAL_COMMUNITY)
Admission: EM | Admit: 2017-12-16 | Discharge: 2017-12-16 | Disposition: A | Discharge status: Home / Self Care | Source: Home / Self Care | Attending: Emergency Medicine | Admitting: Emergency Medicine

## 2017-12-16 ENCOUNTER — Other Ambulatory Visit: Payer: Self-pay

## 2017-12-16 DIAGNOSIS — S199XXA Unspecified injury of neck, initial encounter: Secondary | ICD-10-CM | POA: Diagnosis not present

## 2017-12-16 DIAGNOSIS — S0990XA Unspecified injury of head, initial encounter: Secondary | ICD-10-CM | POA: Diagnosis not present

## 2017-12-16 DIAGNOSIS — J449 Chronic obstructive pulmonary disease, unspecified: Secondary | ICD-10-CM

## 2017-12-16 DIAGNOSIS — S3991XA Unspecified injury of abdomen, initial encounter: Secondary | ICD-10-CM | POA: Diagnosis not present

## 2017-12-16 DIAGNOSIS — S299XXA Unspecified injury of thorax, initial encounter: Secondary | ICD-10-CM | POA: Diagnosis not present

## 2017-12-16 DIAGNOSIS — E039 Hypothyroidism, unspecified: Secondary | ICD-10-CM

## 2017-12-16 DIAGNOSIS — Z79899 Other long term (current) drug therapy: Secondary | ICD-10-CM

## 2017-12-16 DIAGNOSIS — R339 Retention of urine, unspecified: Secondary | ICD-10-CM | POA: Insufficient documentation

## 2017-12-16 DIAGNOSIS — S2241XA Multiple fractures of ribs, right side, initial encounter for closed fracture: Secondary | ICD-10-CM | POA: Diagnosis not present

## 2017-12-16 DIAGNOSIS — S270XXA Traumatic pneumothorax, initial encounter: Secondary | ICD-10-CM | POA: Diagnosis not present

## 2017-12-16 DIAGNOSIS — I10 Essential (primary) hypertension: Secondary | ICD-10-CM

## 2017-12-16 DIAGNOSIS — R0603 Acute respiratory distress: Secondary | ICD-10-CM | POA: Diagnosis not present

## 2017-12-16 DIAGNOSIS — Z87891 Personal history of nicotine dependence: Secondary | ICD-10-CM

## 2017-12-16 DIAGNOSIS — R062 Wheezing: Secondary | ICD-10-CM | POA: Diagnosis not present

## 2017-12-16 DIAGNOSIS — Z7982 Long term (current) use of aspirin: Secondary | ICD-10-CM

## 2017-12-16 DIAGNOSIS — Z4682 Encounter for fitting and adjustment of non-vascular catheter: Secondary | ICD-10-CM | POA: Diagnosis not present

## 2017-12-16 DIAGNOSIS — Z96641 Presence of right artificial hip joint: Secondary | ICD-10-CM | POA: Insufficient documentation

## 2017-12-16 DIAGNOSIS — Y69 Unspecified misadventure during surgical and medical care: Secondary | ICD-10-CM | POA: Insufficient documentation

## 2017-12-16 MED ORDER — OXYBUTYNIN CHLORIDE ER 5 MG PO TB24
5.0000 mg | ORAL_TABLET | Freq: Once | ORAL | Status: AC
  Administered 2017-12-16: 5 mg via ORAL
  Filled 2017-12-16: qty 1

## 2017-12-16 MED ORDER — CEFTRIAXONE SODIUM 1 G IJ SOLR
1.0000 g | Freq: Once | INTRAMUSCULAR | Status: AC
  Administered 2017-12-16: 1 g via INTRAMUSCULAR
  Filled 2017-12-16: qty 10

## 2017-12-16 MED ORDER — LIDOCAINE HCL URETHRAL/MUCOSAL 2 % EX GEL
CUTANEOUS | Status: AC
  Administered 2017-12-16: 11
  Filled 2017-12-16: qty 5

## 2017-12-16 MED ORDER — LIDOCAINE HCL URETHRAL/MUCOSAL 2 % EX GEL
1.0000 "application " | Freq: Once | CUTANEOUS | Status: AC
  Administered 2017-12-16: 1 via URETHRAL
  Filled 2017-12-16: qty 5

## 2017-12-16 MED ORDER — LIDOCAINE HCL 2 % IJ SOLN
INTRAMUSCULAR | Status: AC
  Administered 2017-12-16: 2.1 mL
  Filled 2017-12-16: qty 20

## 2017-12-16 MED ORDER — TAMSULOSIN HCL 0.4 MG PO CAPS: 0.4000 mg | ORAL_CAPSULE | Freq: Every day | ORAL | 0 refills | Status: AC

## 2017-12-16 MED ORDER — CEFUROXIME AXETIL 250 MG PO TABS: 250.0000 mg | ORAL_TABLET | Freq: Two times a day (BID) | ORAL | 0 refills | Status: AC

## 2017-12-16 MED ORDER — OXYCODONE-ACETAMINOPHEN 5-325 MG PO TABS
1.0000 | ORAL_TABLET | Freq: Once | ORAL | Status: AC
Start: 2017-12-16 — End: 2017-12-16
  Administered 2017-12-16: 1 via ORAL
  Filled 2017-12-16: qty 1

## 2017-12-16 MED ORDER — CEFUROXIME AXETIL 250 MG PO TABS: 250.0000 mg | ORAL_TABLET | Freq: Two times a day (BID) | ORAL | 0 refills | Status: DC

## 2017-12-16 MED ORDER — PHENAZOPYRIDINE HCL 200 MG PO TABS
200.0000 mg | ORAL_TABLET | Freq: Three times a day (TID) | ORAL | 0 refills | Status: AC | PRN
Start: 2017-12-16 — End: ?

## 2017-12-16 MED ORDER — PHENAZOPYRIDINE HCL 100 MG PO TABS
100.0000 mg | ORAL_TABLET | Freq: Once | ORAL | Status: AC
  Administered 2017-12-16: 100 mg via ORAL
  Filled 2017-12-16: qty 1

## 2017-12-16 NOTE — ED Notes (Signed)
Patient has a 14 Fr catheter in place that was placed by Hospice yesterday-urine in bag bloody with some clots noted-purulent drainage around meatus noted and meatus red and patient states painful. Peri care given after 14Fr cath removed-#18 Fr coude cath placed-foley irrigated with NS and 2 large clots removed.

## 2017-12-16 NOTE — ED Notes (Signed)
Patient palace on bedpan.

## 2017-12-16 NOTE — ED Provider Notes (Signed)
[facility] DEPT Provider Note   CSN: [id number] Arrival date & time: 12/16/17  0518     History   Chief Complaint Chief Complaint  Patient presents with  . Catheter Pain    HPI Ronald Strong is a 84 y.o. male.  Patient presents to the emergency department for evaluation of catheter pain.  Patient had Foley catheter placed yesterday for urinary retention.  He reports that when the catheter was first placed he did not have any discomfort, however, overnight he started having pain with the catheter and noticing blood in the catheter bag.  He does not recall any trauma.     Past Medical History:  Diagnosis Date  . Allergy   . Arrhythmia   . Arthritis   . Chicken pox   . Colon polyps   . Depression   . Diverticulitis   . Hypertension   . Thyroid disease     Patient Active Problem List   Diagnosis Date Noted  . Right-sided chest wall pain 04/16/2017  . COPD (chronic obstructive pulmonary disease) ([place]) 12/13/2016  . Acute on chronic respiratory failure with hypoxia ([place]) 12/13/2016  . Essential hypertension 12/11/2016  . Depression, major, single episode, moderate ([place]) 08/08/2016  . [place] 10/05/2015  . Postlaminectomy syndrome, lumbar region 01/09/2014  . Other secondary scoliosis, thoracolumbar region 01/09/2014  . Chronic radicular low back pain 06/03/2013  . Hypothyroidism 06/03/2013  . Insomnia 06/03/2013  . BPH without urinary obstruction 06/03/2013    Past Surgical History:  Procedure Laterality Date  . OTHER SURGICAL HISTORY  09/01/10   spine decompression  . PROSTATE SURGERY  01/28/13   laser  . SPINE SURGERY  09/11/10  . TOTAL HIP ARTHROPLASTY  09/21/06   right        Home Medications    Prior to Admission medications   Medication Sig Start Date End Date Taking? Authorizing Provider  aspirin 325 MG EC tablet Take 325 mg by mouth 2 (two) times daily.    [provider]  cefUROXime (CEFTIN) 250 MG tablet  Take 1 tablet (250 mg total) by mouth 2 (two) times daily with a meal. 12/16/17   Pollina, Gwenyth Allegra, MD  citalopram (CELEXA) 20 MG tablet TAKE 1 TABLET DAILY 02/26/17   Dorena Cookey, MD  diphenhydrAMINE (BENADRYL) 25 mg capsule Take 25 mg by mouth daily.    [provider]  hydrochlorothiazide (MICROZIDE) 12.5 MG capsule Requested for the tablets 04/26/17   Dorena Cookey, MD  HYDROcodone-acetaminophen (NORCO/VICODIN) 5-325 MG tablet Take one tab daily for severe pain. 07/04/17   Dorena Cookey, MD  Multiple Vitamin (MULTIVITAMIN WITH MINERALS) TABS tablet Take 1 tablet by mouth daily.    [provider]  olopatadine (PATANOL) 0.1 % ophthalmic solution Place 1 drop into both eyes daily. 11/29/17   Marletta Lor, MD  phenazopyridine (PYRIDIUM) 200 MG tablet Take 1 tablet (200 mg total) by mouth 3 (three) times daily as needed for pain. 12/16/17   Orpah Greek, MD  predniSONE (DELTASONE) 20 MG tablet TAKE 1 TABLET FOR 5 DAYS AND THEN 1/2 TABLET FOR 5 DAYS. 09/25/17   Dorena Cookey, MD  SYNTHROID 137 MCG tablet Take 1 tablet (137 mcg total) by mouth daily before breakfast. 08/08/17   Dorena Cookey, MD  tamsulosin [facility]) 0.4 MG CAPS capsule Take 1 capsule (0.4 mg total) by mouth daily after breakfast. 12/16/17   Pollina, Gwenyth Allegra, MD  traMADol (ULTRAM) 50 MG tablet One tablet  twice a day when necessary severe pain 08/08/17   Dorena Cookey, MD  zolpidem (AMBIEN) 5 MG tablet TAKE 1 TABLET BY MOUTH AT BEDTIME 08/08/17   Dorena Cookey, MD    Family History Family History  Problem Relation Age of Onset  . Arthritis Other   . Heart disease Other   . Hypertension Other     Social History Social History   Tobacco Use  . Smoking status: Former Smoker    Last attempt to quit: 06/04/1983    Years since quitting: 34.5  . Smokeless tobacco: Never Used  Substance Use Topics  . Alcohol use: Yes  . Drug use: No     Allergies   Bee venom; Epinephrine;  and Pollen extract   Review of Systems Review of Systems  Genitourinary: Positive for hematuria.  All other systems reviewed and are negative.    Physical Exam Updated Vital Signs BP 115/70 (BP Location: Right Arm)   Pulse 76   Temp 97.6 F (36.4 C) (Oral)   Resp 17   Ht 5\' 6"  (1.676 m)   Wt 51.7 kg   SpO2 100%   BMI 18.40 kg/m   Physical Exam  Constitutional: He is oriented to person, place, and time. He appears well-developed and well-nourished. No distress.  HENT:  Head: Normocephalic and atraumatic.  Right Ear: Hearing normal.  Left Ear: Hearing normal.  Nose: Nose normal.  Mouth/Throat: Oropharynx is clear and moist and mucous membranes are normal.  Eyes: Pupils are equal, round, and reactive to light. Conjunctivae and EOM are normal.  Neck: Normal range of motion. Neck supple.  Cardiovascular: Regular rhythm, S1 normal and S2 normal. Exam reveals no gallop and no friction rub.  No murmur heard. Pulmonary/Chest: Effort normal and breath sounds normal. No respiratory distress. He exhibits no tenderness.  Abdominal: Soft. Normal appearance and bowel sounds are normal. There is no hepatosplenomegaly. There is no tenderness. There is no rebound, no guarding, no tenderness at McBurney's point and negative Murphy's sign. No hernia.  Musculoskeletal: Normal range of motion.  Neurological: He is alert and oriented to person, place, and time. He has normal strength. No cranial nerve deficit or sensory deficit. Coordination normal. GCS eye subscore is 4. GCS verbal subscore is 5. GCS motor subscore is 6.  Skin: Skin is warm, dry and intact. No rash noted. No cyanosis.  Psychiatric: He has a normal mood and affect. His speech is normal and behavior is normal. Thought content normal.  Nursing note and vitals reviewed.    ED Treatments / Results  Labs (all labs ordered are listed, but only abnormal results are displayed) Labs Reviewed  URINE CULTURE     EKG None  Radiology No results found.  Procedures Procedures (including critical care time)  Medications Ordered in ED Medications  lidocaine (XYLOCAINE) 2 % jelly 1 application (1 application Urethral Given 12/16/17 0540)  cefTRIAXone (ROCEPHIN) injection 1 g (1 g Intramuscular Given 12/16/17 0630)  phenazopyridine (PYRIDIUM) tablet 100 mg (100 mg Oral Given 12/16/17 0630)  lidocaine (XYLOCAINE) 2 % (with pres) injection (2.1 mLs  Given 12/16/17 0631)     Initial Impression / Assessment and Plan / ED Course  I have reviewed the triage vital signs and the nursing notes.  Pertinent labs & imaging results that were available during my care of the patient were reviewed by me and considered in my medical decision making (see chart for details).     Presents with penile pain after having a  Foley catheter placed yesterday by hospice for urinary retention.  Patient noted to have blood in the urine.  No obvious wounds or trauma to the penis itself.  Foley catheter was replaced.  There were some clots noted upon replacement.  Bladder was irrigated by nursing staff.  Will empirically cover with antibiotics, bladder spasm agent.  Final Clinical Impressions(s) / ED Diagnoses   Final diagnoses:  Urinary retention    ED Discharge Orders         Ordered    phenazopyridine (PYRIDIUM) 200 MG tablet  3 times daily PRN     12/16/17 0710    cefUROXime (CEFTIN) 250 MG tablet  2 times daily with meals     12/16/17 0710    tamsulosin (FLOMAX) 0.4 MG CAPS capsule  Daily after breakfast     12/16/17 0710           Orpah Greek, MD 12/16/17 0710

## 2017-12-16 NOTE — ED Notes (Signed)
Bed: ZE09 Expected date:  Expected time:  Means of arrival:  Comments: EMS 84 yo male from home-pain with foley placed yesterday-bloody urine

## 2017-12-16 NOTE — ED Provider Notes (Signed)
Pt was seen by Dr Betsey Holiday.  Please see his note.  I was notified that pt was having penile pain after discharge.  Catheter is flowing properly.  Pt states his pain is in the penis.  Likely irritation from catheter, possible bladder spasm.   Will give a dose of percocet and ditropan prior to discharge   Dorie Rank, MD 12/16/17 [phone]

## 2017-12-16 NOTE — ED Triage Notes (Signed)
Patient BIB EMS from home with complaints of pain after having a catheter placed yesterday for possible urinary retention. Blood noted in catheter bag. No other complaints noted.

## 2017-12-17 ENCOUNTER — Inpatient Hospital Stay (HOSPITAL_COMMUNITY)
Admission: EM | Admit: 2017-12-17 | Discharge: 2017-12-30 | DRG: 200 | Disposition: E | Attending: General Surgery | Admitting: General Surgery

## 2017-12-17 ENCOUNTER — Emergency Department (HOSPITAL_COMMUNITY)

## 2017-12-17 ENCOUNTER — Telehealth: Payer: Self-pay | Admitting: *Deleted

## 2017-12-17 DIAGNOSIS — E039 Hypothyroidism, unspecified: Secondary | ICD-10-CM | POA: Diagnosis not present

## 2017-12-17 DIAGNOSIS — J9620 Acute and chronic respiratory failure, unspecified whether with hypoxia or hypercapnia: Secondary | ICD-10-CM | POA: Diagnosis not present

## 2017-12-17 DIAGNOSIS — S0990XA Unspecified injury of head, initial encounter: Secondary | ICD-10-CM | POA: Diagnosis not present

## 2017-12-17 DIAGNOSIS — T797XXA Traumatic subcutaneous emphysema, initial encounter: Secondary | ICD-10-CM | POA: Diagnosis present

## 2017-12-17 DIAGNOSIS — I10 Essential (primary) hypertension: Secondary | ICD-10-CM | POA: Diagnosis not present

## 2017-12-17 DIAGNOSIS — Z66 Do not resuscitate: Secondary | ICD-10-CM | POA: Diagnosis present

## 2017-12-17 DIAGNOSIS — Z515 Encounter for palliative care: Secondary | ICD-10-CM | POA: Diagnosis not present

## 2017-12-17 DIAGNOSIS — R0902 Hypoxemia: Secondary | ICD-10-CM | POA: Diagnosis not present

## 2017-12-17 DIAGNOSIS — I509 Heart failure, unspecified: Secondary | ICD-10-CM | POA: Diagnosis not present

## 2017-12-17 DIAGNOSIS — S42001A Fracture of unspecified part of right clavicle, initial encounter for closed fracture: Secondary | ICD-10-CM | POA: Diagnosis present

## 2017-12-17 DIAGNOSIS — S270XXA Traumatic pneumothorax, initial encounter: Secondary | ICD-10-CM | POA: Diagnosis not present

## 2017-12-17 DIAGNOSIS — Z79899 Other long term (current) drug therapy: Secondary | ICD-10-CM | POA: Diagnosis not present

## 2017-12-17 DIAGNOSIS — S299XXA Unspecified injury of thorax, initial encounter: Secondary | ICD-10-CM | POA: Diagnosis not present

## 2017-12-17 DIAGNOSIS — Z79890 Hormone replacement therapy (postmenopausal): Secondary | ICD-10-CM | POA: Diagnosis not present

## 2017-12-17 DIAGNOSIS — J939 Pneumothorax, unspecified: Secondary | ICD-10-CM | POA: Diagnosis present

## 2017-12-17 DIAGNOSIS — S199XXA Unspecified injury of neck, initial encounter: Secondary | ICD-10-CM | POA: Diagnosis not present

## 2017-12-17 DIAGNOSIS — S2241XA Multiple fractures of ribs, right side, initial encounter for closed fracture: Secondary | ICD-10-CM

## 2017-12-17 DIAGNOSIS — J449 Chronic obstructive pulmonary disease, unspecified: Secondary | ICD-10-CM | POA: Diagnosis present

## 2017-12-17 DIAGNOSIS — Z91030 Bee allergy status: Secondary | ICD-10-CM

## 2017-12-17 DIAGNOSIS — R0602 Shortness of breath: Secondary | ICD-10-CM | POA: Diagnosis not present

## 2017-12-17 DIAGNOSIS — N320 Bladder-neck obstruction: Secondary | ICD-10-CM | POA: Diagnosis present

## 2017-12-17 DIAGNOSIS — D494 Neoplasm of unspecified behavior of bladder: Secondary | ICD-10-CM | POA: Diagnosis present

## 2017-12-17 DIAGNOSIS — Z888 Allergy status to other drugs, medicaments and biological substances status: Secondary | ICD-10-CM

## 2017-12-17 DIAGNOSIS — W1830XA Fall on same level, unspecified, initial encounter: Secondary | ICD-10-CM | POA: Diagnosis present

## 2017-12-17 DIAGNOSIS — W19XXXA Unspecified fall, initial encounter: Secondary | ICD-10-CM | POA: Diagnosis not present

## 2017-12-17 DIAGNOSIS — I959 Hypotension, unspecified: Secondary | ICD-10-CM | POA: Diagnosis not present

## 2017-12-17 DIAGNOSIS — Z4682 Encounter for fitting and adjustment of non-vascular catheter: Secondary | ICD-10-CM | POA: Diagnosis not present

## 2017-12-17 DIAGNOSIS — F339 Major depressive disorder, recurrent, unspecified: Secondary | ICD-10-CM | POA: Diagnosis not present

## 2017-12-17 DIAGNOSIS — R0603 Acute respiratory distress: Secondary | ICD-10-CM | POA: Diagnosis present

## 2017-12-17 DIAGNOSIS — S3991XA Unspecified injury of abdomen, initial encounter: Secondary | ICD-10-CM | POA: Diagnosis not present

## 2017-12-17 LAB — COMPREHENSIVE METABOLIC PANEL
ALT: 15 U/L (ref 0–44)
ANION GAP: 7 (ref 5–15)
AST: 28 U/L (ref 15–41)
Albumin: 3.4 g/dL — ABNORMAL LOW (ref 3.5–5.0)
Alkaline Phosphatase: 56 U/L (ref 38–126)
BILIRUBIN TOTAL: 1 mg/dL (ref 0.3–1.2)
BUN: 29 mg/dL — ABNORMAL HIGH (ref 8–23)
CO2: 30 mmol/L (ref 22–32)
Calcium: 9.9 mg/dL (ref 8.9–10.3)
Chloride: 103 mmol/L (ref 98–111)
Creatinine, Ser: 1.8 mg/dL — ABNORMAL HIGH (ref 0.61–1.24)
GFR, EST AFRICAN AMERICAN: 35 mL/min — AB (ref >60)
GFR, EST NON AFRICAN AMERICAN: 31 mL/min — AB (ref >60)
Glucose, Bld: 127 mg/dL — ABNORMAL HIGH (ref 70–99)
POTASSIUM: 4.2 mmol/L (ref 3.5–5.1)
Sodium: 140 mmol/L (ref 135–145)
TOTAL PROTEIN: 5.7 g/dL — AB (ref 6.5–8.1)

## 2017-12-17 LAB — PROTIME-INR
INR: 1.24
PROTHROMBIN TIME: 15.5 s — AB (ref 11.4–15.2)

## 2017-12-17 LAB — PREPARE FRESH FROZEN PLASMA
Unit division: 0
Unit division: 0

## 2017-12-17 LAB — BPAM FFP
Blood Product Expiration Date: 201908312359
Blood Product Expiration Date: 201909022359
ISSUE DATE / TIME: 201908190541
ISSUE DATE / TIME: 201908190541
UNIT TYPE AND RH: 6200
Unit Type and Rh: 6200

## 2017-12-17 LAB — CBC
HEMATOCRIT: 40.1 % (ref 39.0–52.0)
HEMOGLOBIN: 12.4 g/dL — AB (ref 13.0–17.0)
MCH: 32 pg (ref 26.0–34.0)
MCHC: 30.9 g/dL (ref 30.0–36.0)
MCV: 103.6 fL — ABNORMAL HIGH (ref 78.0–100.0)
Platelets: 212 10*3/uL (ref 150–400)
RBC: 3.87 MIL/uL — AB (ref 4.22–5.81)
RDW: 12.4 % (ref 11.5–15.5)
WBC: 20.7 10*3/uL — AB (ref 4.0–10.5)

## 2017-12-17 LAB — ETHANOL

## 2017-12-17 LAB — I-STAT CHEM 8, ED
BUN: 31 mg/dL — AB (ref 8–23)
CALCIUM ION: 1.35 mmol/L (ref 1.15–1.40)
Chloride: 100 mmol/L (ref 98–111)
Creatinine, Ser: 1.7 mg/dL — ABNORMAL HIGH (ref 0.61–1.24)
GLUCOSE: 120 mg/dL — AB (ref 70–99)
HCT: 38 % — ABNORMAL LOW (ref 39.0–52.0)
Hemoglobin: 12.9 g/dL — ABNORMAL LOW (ref 13.0–17.0)
Potassium: 4.1 mmol/L (ref 3.5–5.1)
SODIUM: 138 mmol/L (ref 135–145)
TCO2: 28 mmol/L (ref 22–32)

## 2017-12-17 LAB — URINE CULTURE: CULTURE: NO GROWTH

## 2017-12-17 LAB — CDS SEROLOGY

## 2017-12-17 LAB — ABO/RH: ABO/RH(D): O POS

## 2017-12-17 LAB — I-STAT CG4 LACTIC ACID, ED: LACTIC ACID, VENOUS: 1.02 mmol/L (ref 0.5–1.9)

## 2017-12-17 MED ORDER — LORAZEPAM 2 MG/ML IJ SOLN
0.5000 mg | INTRAMUSCULAR | Status: DC | PRN
  Administered 2017-12-17 – 2017-12-18 (×2): 0.5 mg via INTRAVENOUS
  Filled 2017-12-17 (×2): qty 1

## 2017-12-17 MED ORDER — SODIUM CHLORIDE 0.9 % IV SOLN
INTRAVENOUS | Status: DC
  Administered 2017-12-17: 20:00:00 via INTRAVENOUS

## 2017-12-17 MED ORDER — ONDANSETRON HCL 4 MG/2ML IJ SOLN: 4.0000 mg | Freq: Four times a day (QID) | INTRAMUSCULAR | Status: DC | PRN

## 2017-12-17 MED ORDER — FENTANYL CITRATE (PF) 100 MCG/2ML IJ SOLN
25.0000 ug | Freq: Once | INTRAMUSCULAR | Status: AC
  Administered 2017-12-17: 25 ug via INTRAVENOUS

## 2017-12-17 MED ORDER — IOPAMIDOL (ISOVUE-300) INJECTION 61%
100.0000 mL | Freq: Once | INTRAVENOUS | Status: AC | PRN
  Administered 2017-12-17: 100 mL via INTRAVENOUS

## 2017-12-17 MED ORDER — MORPHINE SULFATE (PF) 4 MG/ML IV SOLN: 4.0000 mg | INTRAVENOUS | Status: DC | PRN

## 2017-12-17 MED ORDER — ONDANSETRON 4 MG PO TBDP: 4.0000 mg | ORAL_TABLET | Freq: Four times a day (QID) | ORAL | Status: DC | PRN

## 2017-12-17 MED ORDER — MORPHINE SULFATE (PF) 2 MG/ML IV SOLN: 2.0000 mg | INTRAVENOUS | Status: DC | PRN

## 2017-12-17 MED ORDER — HYDROMORPHONE HCL 1 MG/ML IJ SOLN
INTRAMUSCULAR | Status: AC
  Filled 2017-12-17: qty 1

## 2017-12-17 MED ORDER — FENTANYL CITRATE (PF) 100 MCG/2ML IJ SOLN
INTRAMUSCULAR | Status: AC
  Filled 2017-12-17: qty 2

## 2017-12-17 MED ORDER — HYDROMORPHONE HCL 1 MG/ML IJ SOLN
1.0000 mg | Freq: Once | INTRAMUSCULAR | Status: AC
  Administered 2017-12-17: 1 mg via INTRAVENOUS

## 2017-12-17 MED ORDER — HYDROMORPHONE HCL 1 MG/ML IJ SOLN
0.5000 mg | INTRAMUSCULAR | Status: DC | PRN
  Administered 2017-12-17 – 2017-12-18 (×3): 0.5 mg via INTRAVENOUS
  Filled 2017-12-17 (×3): qty 1

## 2017-12-17 MED ORDER — GLYCOPYRROLATE 0.2 MG/ML IJ SOLN: 0.2000 mg | Freq: Four times a day (QID) | INTRAMUSCULAR | Status: DC | PRN

## 2017-12-17 NOTE — Progress Notes (Signed)
Chaplain responded to trauma page.  On pt arrival offered spiritual presence outside room as pt treated.  Went to see if family was in waiting area per staff request, but no family had arrived until this time.  Please page chaplain if further spiritual care is needed.

## 2017-12-17 NOTE — Telephone Encounter (Signed)
Patient is currently in ED 

## 2017-12-17 NOTE — Progress Notes (Signed)
Palliative Medicine RN Note: Pt is resting with eyes closed. He does raise his eyebrows when I speak to him, but he does not interact with me. He remains on NRB. His son has arrived from [place].   At the request of patient's daughter Ronald Strong, I emailed a letter to the chaplain at lock up to try to get Mr Ronald granddaughter out to see him, as his prognosis is very poor. I sent it at 1500 to [email or web address].  If Mr Strong survives the night, I will follow up in the am.  Marjie Skiff. Shadana Pry, RN, BSN, [facility] Palliative Medicine Team 12/11/2017 4:41 PM Office [phone]

## 2017-12-17 NOTE — Progress Notes (Signed)
Patient ID: Ronald Strong, male   DOB: 1920-12-29, 84 y.o.   MRN: [id number]  Procedure note:  This gentleman came in as a level 1 trauma after a fall.  He had obvious crepitus and subcutaneous emphysema on his right chest.  The chest x-ray confirmed the pneumothorax.  I prepped his right chest.  I anesthetized the skin with lidocaine.  I made a small incision with a scalpel.  I then excised the underlying tissue and ribs.  I then used the introducer needle to gain entrance into the right thoracic cavity.  I passed a wire over the needle into the chest and removed the needle.  I then placed the pigtail catheter after dilating the tract over the wire and into the right thoracic cavity.  This was sutured in place.  He was placed to a Pleur-evac.  X-ray confirmed placement in the right chest.  The patient tolerated it well and is now heading to CT for further imaging

## 2017-12-17 NOTE — Progress Notes (Signed)
[facility] ED - [facility] ([facility]) - GIP RN visit at 1230  This is a related and covered GIP admission with a [facility] diagnosis of Heart Failure per Dr. Cherie Ouch, [facility] Physician.  Patient has an [place] DNR.  Prior to Admission, [facility]  answering service notified at 4:00am on 12/09/2017 by patient CG after pt had an unwitnessed fall on his way to the bathroom. Pt had complaints of pain in right side of body. [facility] RN notified and arrived to the home, after pt adm Hydrocodone by daughter Joycelyn Schmid) for pain, noting that pt was still in pain with HR 72 and 85%on 2L, and had audible wheezing.  Tramadol adm for pain at that time. CG administered breathing tx and O2 sats up to 91% on 2L.  Upon [facility] RN assessment, Pt had brusing/abrasion on R flank  and after turning pt (on his own), pt developed immediate swelling of R chest and pain increased.  EMS notified and pt taken to [facility] ED at 05:35am on 12/22/2017 and admitted to floor at 13:36 12/19/2017 with possible Rib Fractures/Pneumonthorax.   Checked in with bedside ER RN who stated that patient was to be admitted to [facility] for comfort care with a poor prognosis. Visited patient in trauma room who had PCP Dr. Sherren Mocha, Daughter Joycelyn Schmid, [place] and [facility] SW at side offering support. Pt on NRB mask working hard to breathe with elevated RR/visable accessory muscle use, Foley catheter and chest tube in place. Encouraged family to express concerns and call for EOL questions, leaving [facility] contact information at bedside. Informed family that [facility] will continue to follow patient through today and daily while hospitalized.   Per Epic Notes: Patient to be admitted to [facility] 6N 19, remain on NRB mask with FiO2 100%, receive comfort care prn medications as needed, PMT consulted to follow for uncontrolled symptoms. Anticipated Hospital Death. PRN Medications ordered for Comfort: Dilaudid 0.5mg  inj Q2hr (adm last at 1326), Robinul 0.2 mg inj Q6hr, Iopamidol 61% inj  112ml dose (last adm at 0646), Ativan 0.5mg  IV q4hr.  [facility] GIP LOS: Day 1  Discharge Planning: Patient is to be an expected hospital death at this time. [facility] will continue to assess needs while hospitalized. Family does not desire a transfer to [facility].   Goals of Care: Patient family is requesting comfort care at hospital to help manage respiratory and pain symptoms of at this time with support of Hospice and PMT.   Communication with [facility]: Spoke with daughter at bedside who noted that Ihor Austin from [place] is expected to arrive at  [facility] this afternoon.   Communication with [facility] SW at bedside during time of this visit. [facility] team including Chaplain and [facility] RN made of patient condition. PCP at patient bedside.   [facility] Transfer Summary and Medication List placed on shadow chart  Please call [facility] with any hospice related concerns,  Gar Ponto, RN [facility] Liaison [place] found on AMION

## 2017-12-17 NOTE — ED Notes (Signed)
Dr. Betsey Holiday at bedside speaking with family

## 2017-12-17 NOTE — ED Notes (Signed)
Attempted report x1. 

## 2017-12-17 NOTE — ED Provider Notes (Signed)
[facility] EMERGENCY DEPARTMENT Provider Note   CSN: [id number] Arrival date & time: 12/28/2017  0535     History   Chief Complaint Chief Complaint  Patient presents with  . Fall    HPI Ronald Strong is a 84 y.o. male.  Patient presents to the emergency department for evaluation after unwitnessed fall.  She brought to the emergency department by EMS.  EMS report the patient has been hypoxic despite being placed on nonrebreather facemask.  Patient has significant deformity to the right chest with diffuse right-sided subcutaneous air noted.  Patient reports pain in the right side of his chest upon arrival.  He has been administered analgesia by EMS with some improvement.     No past medical history on file.  Patient Active Problem List   Diagnosis Date Noted  . Pneumothorax on right 12/11/2017        Home Medications    Prior to Admission medications   Medication Sig Start Date End Date Taking? Authorizing Provider  citalopram (CELEXA) 20 MG tablet 30 mg See admin instructions. Taking 30mg  daily. 20mg  + 10mg  = 30mg  dose daily   Yes [provider]  hydrochlorothiazide (HYDRODIURIL) 12.5 MG tablet Take 1 tablet by mouth 3 (three) times a week. Monday, Wednesday, Friday 11/18/17  Yes [provider]  ICY HOT 5 % PTCH Apply 1 patch topically every 8 (eight) hours as needed for pain. 11/28/17  Yes [provider]  ipratropium-albuterol (DUONEB) 0.5-2.5 (3) MG/3ML SOLN Inhale 3 mLs into the lungs 4 (four) times daily. For wheezing and Shortness of breath 11/18/17  Yes [provider]  olopatadine (PATANOL) 0.1 % ophthalmic solution Place 1 drop into both eyes daily. 11/29/17  Yes [provider]  SYNTHROID 137 MCG tablet Take 137 mcg by mouth every morning. 11/21/17  Yes [provider]  zolpidem (AMBIEN) 5 MG tablet Take 5 mg by mouth at bedtime. 11/26/17  Yes [provider]    Family History No family  history on file.  Social History Social History   Tobacco Use  . Smoking status: Not on file  Substance Use Topics  . Alcohol use: Not on file  . Drug use: Not on file     Allergies   Bee venom and Epinephrine (anaphylaxis)   Review of Systems Review of Systems  Unable to perform ROS: Acuity of condition     Physical Exam Updated Vital Signs BP (!) 102/54 (BP Location: Left Arm)   Pulse 66   Temp (!) 97.4 F (36.3 C) (Tympanic)   Resp (!) 8   Ht 5\' 6"  (2.595 m)   Wt 47.2 kg   SpO2 100%   BMI 16.79 kg/m   Physical Exam  Constitutional: He appears well-developed and well-nourished.  HENT:  Head: Atraumatic.  Eyes: Pupils are equal, round, and reactive to light.  Neck: Neck supple.  Cardiovascular: Normal rate and regular rhythm.  Pulmonary/Chest: Accessory muscle usage present. Tachypnea noted. He has decreased breath sounds in the right upper field, the right middle field and the right lower field. He exhibits crepitus and swelling.  Significant swelling diffusely of the entire right chest wall with diffuse crepitus and subcutaneous air from sternum to thoracic spine region on the right  Abdominal: Soft.  Musculoskeletal: Normal range of motion.  Neurological: He is alert.  Skin: Skin is warm and dry.     ED Treatments / Results  Labs (all labs ordered are listed, but only abnormal results are  displayed) Labs Reviewed  COMPREHENSIVE METABOLIC PANEL - Abnormal; Notable for the following components:      Result Value   Glucose, Bld 127 (*)    BUN 29 (*)    Creatinine, Ser 1.80 (*)    Total Protein 5.7 (*)    Albumin 3.4 (*)    GFR calc non Af Amer 31 (*)    GFR calc Af Amer 35 (*)    All other components within normal limits  CBC - Abnormal; Notable for the following components:   WBC 20.7 (*)    RBC 3.87 (*)    Hemoglobin 12.4 (*)    MCV 103.6 (*)    All other components within normal limits  PROTIME-INR - Abnormal; Notable for the following  components:   Prothrombin Time 15.5 (*)    All other components within normal limits  I-STAT CHEM 8, ED - Abnormal; Notable for the following components:   BUN 31 (*)    Creatinine, Ser 1.70 (*)    Glucose, Bld 120 (*)    Hemoglobin 12.9 (*)    HCT 38.0 (*)    All other components within normal limits  CDS SEROLOGY  ETHANOL  I-STAT CG4 LACTIC ACID, ED  TYPE AND SCREEN  PREPARE FRESH FROZEN PLASMA  ABO/RH    EKG None  Radiology Ct Head Wo Contrast  Result Date: 12/29/2017 CLINICAL DATA:  Level 1 trauma. Fell at home with chest deformity. EXAM: CT HEAD WITHOUT CONTRAST CT CERVICAL SPINE WITHOUT CONTRAST TECHNIQUE: Multidetector CT imaging of the head and cervical spine was performed following the standard protocol without intravenous contrast. Multiplanar CT image reconstructions of the cervical spine were also generated. COMPARISON:  04/04/2017 FINDINGS: CT HEAD FINDINGS Brain: Diffuse cerebral atrophy. Low-attenuation changes throughout the deep white matter consistent with small vessel ischemia. No mass-effect or midline shift. No abnormal extra-axial fluid collections. Gray-white matter junctions are distinct. Basal cisterns are not effaced. No acute intracranial hemorrhage. Vascular: Mild intracranial arterial vascular calcifications are present. Skull: Calvarium appears intact. Sinuses/Orbits: Paranasal sinuses and mastoid air cells are clear. Other: Soft tissue emphysema demonstrated in the right side of the neck. CT CERVICAL SPINE FINDINGS Alignment: Slight anterior subluxation of C7 on T1 and T1 on T2. Alignment is unchanged since previous study. Normal alignment of the facet joints. C1-2 articulation appears intact. Skull base and vertebrae: Skull base appears intact. Soft tissues and spinal canal: No vertebral compression deformities. No focal bone lesion or bone destruction. Disc levels: Severe degenerative changes throughout the cervical spine with narrowed interspaces, endplate  hypertrophic changes, and endplate erosions. Most prominent at C4-5, C5-6, and C6-7 levels. Prominent degenerative changes at C1-2 as well. Degenerative changes in the cervical facet joints. Upper chest: Motion artifact limits examination. There is a right apical pneumothorax with subcutaneous emphysema in the right supraclavicular region extending up into the right side of the neck. Other: None. IMPRESSION: 1. No acute intracranial abnormalities. Chronic atrophy and small vessel ischemic changes. 2. Severe degenerative change throughout the cervical spine. No change in alignment since previous study. 3. No acute displaced fractures identified. 4. Incidental note of a right apical pneumothorax with subcutaneous emphysema in the right supraclavicular region and right neck. See additional report of CT chest. These results were called by telephone at the time of interpretation on 12/21/2017 at 6:55 am to Dr. Rush Farmer, who verbally acknowledged these results. Electronically Signed   By: Lucienne Capers M.D.   On: 12/16/2017 06:56   Ct Chest W Contrast  Result Date: 12/11/2017 CLINICAL DATA:  Level 1 trauma. Fell at home. EXAM: CT CHEST, ABDOMEN, AND PELVIS WITH CONTRAST TECHNIQUE: Multidetector CT imaging of the chest, abdomen and pelvis was performed following the standard protocol during bolus administration of intravenous contrast. CONTRAST:  168mL ISOVUE-300 IOPAMIDOL (ISOVUE-300) INJECTION 61% COMPARISON:  None. FINDINGS: CT CHEST FINDINGS Cardiovascular: Normal heart size. No pericardial effusion. Coronary artery calcifications. Normal caliber thoracic aorta with scattered calcifications. Great vessel origins are patent. No mediastinal hematoma identified. Mediastinum/Nodes: Esophagus is mostly decompressed. Small amount of fluid and air in the esophagus without distention likely due to reflux or dysmotility. Possible small diverticulum of the cervical esophagus. Lungs/Pleura: Small to moderate right  pneumothorax without tension. Small right pleural effusion. Basilar atelectasis or consolidation on the right. Patchy areas of infiltration in both upper lungs may be due to contusion or atelectasis. A right chest tube is present. Musculoskeletal: Extensive subcutaneous emphysema in the right chest and extending into the right side of the neck. Lesser amount of subcutaneous emphysema in the anterior left chest wall. Soft tissue emphysema extends into the right arm. Multiple displaced right rib fractures including the seventh, eighth, ninth, tenth ribs. Left ribs appear intact. Comminuted nondisplaced fractures of the medial right clavicular head. Sternum and thoracic spine appear intact. Bridging anterior osteophytes throughout the thoracic spine. CT ABDOMEN PELVIS FINDINGS Hepatobiliary: Multiple hepatic cysts versus dilated bile ducts. Gallbladder is distended without wall thickening. No stones identified. No bile duct dilatation. Cyst in the caudate lobe of the liver measures 6.6 cm diameter. No evidence of hepatic laceration or hematoma. Pancreas: Unremarkable. No pancreatic ductal dilatation or surrounding inflammatory changes. Spleen: No splenic injury or perisplenic hematoma. Adrenals/Urinary Tract: No adrenal hemorrhage or renal injury identified. Cyst in the lower pole right kidney. There is a Foley catheter present. What is presumably the bladder represents a 11.6 x 8.9 cm diameter area of soft tissue density. This could represent a large mass obliterating the bladder or heterogeneous hemorrhage. Delayed CT imaging or cystoscopy is suggested for further evaluation. No extraluminal hemorrhage is suggested. Stomach/Bowel: Stomach, small bowel, and colon are mostly decompressed with scattered stool in the colon. Diverticulosis of the colon without evidence of inflammatory change. No bowel wall thickening is appreciated although under distention limits evaluation. Appendix is not identified.  Vascular/Lymphatic: Aortic atherosclerosis. No enlarged abdominal or pelvic lymph nodes. Reproductive: Prostate gland appears normal size although con newly with the abnormal appearing bladder is not excluded. Visualization is limited due to streak artifact from right hip arthroplasty. Other: No free air or free fluid in the abdomen. Soft tissue emphysema extends along the right lateral abdominal wall down to the pelvis and into the anterior pelvis and inguinal/scrotal regions. Musculoskeletal: Degenerative changes throughout the thoracic spine. Normal alignment. Right hip arthroplasty. Sacrum, pelvis, and hips appear intact. IMPRESSION: 1. Small to moderate right pneumothorax without tension. Right chest tube in place. Multiple displaced right rib fractures. Basilar atelectasis or consolidation on the right. Small right pleural effusion. No mediastinal hematoma. Probable pulmonary contusions. 2. Extensive subcutaneous emphysema in the right chest and abdomen extending into the right arm and down to the scrotum. No evidence of solid organ injury or bowel perforation. 3. Large area of soft tissue density corresponding to the bladder with Foley catheter inside. This could represent a large mass obliterating the bladder or heterogeneous hemorrhage within the bladder. Delayed CT imaging or cystoscopy is suggested for further evaluation. 4. Multiple hepatic cysts versus dilated bile ducts. 5. Aortic atherosclerosis Emphysema (ICD10-J43.9).  These results were called by telephone at the time of interpretation on 12/06/2017 at 7:07 am to Dr. Grandville Silos , who verbally acknowledged these results. Electronically Signed   By: Lucienne Capers M.D.   On: 12/05/2017 07:14   Ct Cervical Spine Wo Contrast  Result Date: 12/05/2017 CLINICAL DATA:  Level 1 trauma. Fell at home with chest deformity. EXAM: CT HEAD WITHOUT CONTRAST CT CERVICAL SPINE WITHOUT CONTRAST TECHNIQUE: Multidetector CT imaging of the head and cervical spine was  performed following the standard protocol without intravenous contrast. Multiplanar CT image reconstructions of the cervical spine were also generated. COMPARISON:  04/04/2017 FINDINGS: CT HEAD FINDINGS Brain: Diffuse cerebral atrophy. Low-attenuation changes throughout the deep white matter consistent with small vessel ischemia. No mass-effect or midline shift. No abnormal extra-axial fluid collections. Gray-white matter junctions are distinct. Basal cisterns are not effaced. No acute intracranial hemorrhage. Vascular: Mild intracranial arterial vascular calcifications are present. Skull: Calvarium appears intact. Sinuses/Orbits: Paranasal sinuses and mastoid air cells are clear. Other: Soft tissue emphysema demonstrated in the right side of the neck. CT CERVICAL SPINE FINDINGS Alignment: Slight anterior subluxation of C7 on T1 and T1 on T2. Alignment is unchanged since previous study. Normal alignment of the facet joints. C1-2 articulation appears intact. Skull base and vertebrae: Skull base appears intact. Soft tissues and spinal canal: No vertebral compression deformities. No focal bone lesion or bone destruction. Disc levels: Severe degenerative changes throughout the cervical spine with narrowed interspaces, endplate hypertrophic changes, and endplate erosions. Most prominent at C4-5, C5-6, and C6-7 levels. Prominent degenerative changes at C1-2 as well. Degenerative changes in the cervical facet joints. Upper chest: Motion artifact limits examination. There is a right apical pneumothorax with subcutaneous emphysema in the right supraclavicular region extending up into the right side of the neck. Other: None. IMPRESSION: 1. No acute intracranial abnormalities. Chronic atrophy and small vessel ischemic changes. 2. Severe degenerative change throughout the cervical spine. No change in alignment since previous study. 3. No acute displaced fractures identified. 4. Incidental note of a right apical pneumothorax  with subcutaneous emphysema in the right supraclavicular region and right neck. See additional report of CT chest. These results were called by telephone at the time of interpretation on 12/19/2017 at 6:55 am to Dr. Rush Farmer, who verbally acknowledged these results. Electronically Signed   By: Lucienne Capers M.D.   On: 12/07/2017 06:56   Ct Abdomen Pelvis W Contrast  Result Date: 12/05/2017 CLINICAL DATA:  Level 1 trauma. Fell at home. EXAM: CT CHEST, ABDOMEN, AND PELVIS WITH CONTRAST TECHNIQUE: Multidetector CT imaging of the chest, abdomen and pelvis was performed following the standard protocol during bolus administration of intravenous contrast. CONTRAST:  177mL ISOVUE-300 IOPAMIDOL (ISOVUE-300) INJECTION 61% COMPARISON:  None. FINDINGS: CT CHEST FINDINGS Cardiovascular: Normal heart size. No pericardial effusion. Coronary artery calcifications. Normal caliber thoracic aorta with scattered calcifications. Great vessel origins are patent. No mediastinal hematoma identified. Mediastinum/Nodes: Esophagus is mostly decompressed. Small amount of fluid and air in the esophagus without distention likely due to reflux or dysmotility. Possible small diverticulum of the cervical esophagus. Lungs/Pleura: Small to moderate right pneumothorax without tension. Small right pleural effusion. Basilar atelectasis or consolidation on the right. Patchy areas of infiltration in both upper lungs may be due to contusion or atelectasis. A right chest tube is present. Musculoskeletal: Extensive subcutaneous emphysema in the right chest and extending into the right side of the neck. Lesser amount of subcutaneous emphysema in the anterior left chest wall. Soft tissue emphysema extends  into the right arm. Multiple displaced right rib fractures including the seventh, eighth, ninth, tenth ribs. Left ribs appear intact. Comminuted nondisplaced fractures of the medial right clavicular head. Sternum and thoracic spine appear intact.  Bridging anterior osteophytes throughout the thoracic spine. CT ABDOMEN PELVIS FINDINGS Hepatobiliary: Multiple hepatic cysts versus dilated bile ducts. Gallbladder is distended without wall thickening. No stones identified. No bile duct dilatation. Cyst in the caudate lobe of the liver measures 6.6 cm diameter. No evidence of hepatic laceration or hematoma. Pancreas: Unremarkable. No pancreatic ductal dilatation or surrounding inflammatory changes. Spleen: No splenic injury or perisplenic hematoma. Adrenals/Urinary Tract: No adrenal hemorrhage or renal injury identified. Cyst in the lower pole right kidney. There is a Foley catheter present. What is presumably the bladder represents a 11.6 x 8.9 cm diameter area of soft tissue density. This could represent a large mass obliterating the bladder or heterogeneous hemorrhage. Delayed CT imaging or cystoscopy is suggested for further evaluation. No extraluminal hemorrhage is suggested. Stomach/Bowel: Stomach, small bowel, and colon are mostly decompressed with scattered stool in the colon. Diverticulosis of the colon without evidence of inflammatory change. No bowel wall thickening is appreciated although under distention limits evaluation. Appendix is not identified. Vascular/Lymphatic: Aortic atherosclerosis. No enlarged abdominal or pelvic lymph nodes. Reproductive: Prostate gland appears normal size although con newly with the abnormal appearing bladder is not excluded. Visualization is limited due to streak artifact from right hip arthroplasty. Other: No free air or free fluid in the abdomen. Soft tissue emphysema extends along the right lateral abdominal wall down to the pelvis and into the anterior pelvis and inguinal/scrotal regions. Musculoskeletal: Degenerative changes throughout the thoracic spine. Normal alignment. Right hip arthroplasty. Sacrum, pelvis, and hips appear intact. IMPRESSION: 1. Small to moderate right pneumothorax without tension. Right chest  tube in place. Multiple displaced right rib fractures. Basilar atelectasis or consolidation on the right. Small right pleural effusion. No mediastinal hematoma. Probable pulmonary contusions. 2. Extensive subcutaneous emphysema in the right chest and abdomen extending into the right arm and down to the scrotum. No evidence of solid organ injury or bowel perforation. 3. Large area of soft tissue density corresponding to the bladder with Foley catheter inside. This could represent a large mass obliterating the bladder or heterogeneous hemorrhage within the bladder. Delayed CT imaging or cystoscopy is suggested for further evaluation. 4. Multiple hepatic cysts versus dilated bile ducts. 5. Aortic atherosclerosis Emphysema (ICD10-J43.9). These results were called by telephone at the time of interpretation on 12/24/2017 at 7:07 am to Dr. Grandville Silos , who verbally acknowledged these results. Electronically Signed   By: Lucienne Capers M.D.   On: 12/09/2017 07:14   Dg Chest Portable 1 View  Result Date: 12/21/2017 CLINICAL DATA:  Initial evaluation status post chest tube placement. EXAM: PORTABLE CHEST 1 VIEW COMPARISON:  Prior radiograph from earlier the same day. FINDINGS: Stable cardiomegaly. Mediastinal silhouette normal. Aortic atherosclerosis. No mediastinal shift. Interval placement of right-sided pigtail chest tube with tip overlying the right hilar region. Persistent right apical pneumothorax, similar to perhaps slightly decreased in size with pleural edge overlying the right third rib. Overall volume approximately 10-15%. Opacity within the right lung likely reflects atelectatic changes, congestion, and possibly small amount of hemorrhage. Left lung remains clear. Extensive emphysema again seen throughout the right chest wall. Acute fracture of the right lateral sixth, seventh, and likely eighth ribs. IMPRESSION: 1. Interval placement of right-sided pigtail chest tube with tip overlying the right hilum.  Persistent right apical pneumothorax with  pleural edge overlying the right third rib (approximately 10-15% in volume). No mediastinal shift. 2. Acute mildly displaced fractures of the right lateral sixth and seventh ribs, and possibly eighth rib. 3. Extensive soft tissue emphysema throughout the right chest wall. Electronically Signed   By: Jeannine Boga M.D.   On: 12/05/2017 06:32   Dg Chest Portable 1 View  Result Date: 12/12/2017 CLINICAL DATA:  Initial evaluation for acute trauma, flail chest. EXAM: PORTABLE CHEST 1 VIEW COMPARISON:  Prior radiograph from 04/04/2017. FINDINGS: Cardiac and mediastinal silhouettes are stable in size and contour, and remain within normal limits. Aortic atherosclerosis. Lungs are hypoinflated. Right apical pneumothorax is visualized, pleural edge at the fourth intercostal airspace. Scattered opacity throughout the right lung likely reflects atelectatic changes and/or congestion. Mild scattered atelectatic changes at the left lung base. Left lung is otherwise clear. Extensive soft tissue emphysema throughout the right lateral chest wall. No appreciable displaced rib fracture on this limited single frontal view of the chest. IMPRESSION: 1. Acute right apical pneumothorax, pleural edge at the right fourth intercostal airspace. 2. Scattered opacity throughout the right lung, likely associated atelectasis and/or congestion. 3. Extensive soft tissue emphysema within the right lateral chest wall. Critical Value/emergent results were called by telephone at the time of interpretation on 12/26/2017 at 5:56 am to Dr. Joseph Berkshire , who verbally acknowledged these results. Electronically Signed   By: Jeannine Boga M.D.   On: 12/14/2017 05:56    Procedures Procedures (including critical care time)  Medications Ordered in ED Medications  0.9 %  sodium chloride infusion ( Intravenous Rate/Dose Verify 12/23/2017 0134)  ondansetron (ZOFRAN-ODT) disintegrating tablet 4  mg (has no administration in time range)    Or  ondansetron (ZOFRAN) injection 4 mg (has no administration in time range)  HYDROmorphone (DILAUDID) injection 0.5 mg (0.5 mg Intravenous Given 2017/12/23 0622)  LORazepam (ATIVAN) injection 0.5 mg (0.5 mg Intravenous Given 12-23-17 0655)  glycopyrrolate (ROBINUL) injection 0.2 mg (has no administration in time range)  fentaNYL (SUBLIMAZE) injection 25 mcg (25 mcg Intravenous Given 12/26/2017 0606)  iopamidol (ISOVUE-300) 61 % injection 100 mL (100 mLs Intravenous Contrast Given 12/21/2017 0646)  HYDROmorphone (DILAUDID) injection 1 mg (1 mg Intravenous Given 12/06/2017 0607)     Initial Impression / Assessment and Plan / ED Course  I have reviewed the triage vital signs and the nursing notes.  Pertinent labs & imaging results that were available during my care of the patient were reviewed by me and considered in my medical decision making (see chart for details).     Patient brought to the emergency department after a fall at home.  Fall was unwitnessed, it is unclear if he struck any objects when he fell.  Patient has massive swelling and subcutaneous emphysema on the right chest wall.  Portable chest x-ray at arrival reveals pneumothorax and rib fractures, no obvious tension features.  Patient to undergo CT head, cervical spine, chest, abdomen, pelvis.  Dr. Ninfa Linden present, has placed the chest tube in the patient.  Patient will be admitted to the trauma service.  CRITICAL CARE Performed by: Orpah Greek   Total critical care time: 35 minutes  Critical care time was exclusive of separately billable procedures and treating other patients.  Critical care was necessary to treat or prevent imminent or life-threatening deterioration.  Critical care was time spent personally by me on the following activities: development of treatment plan with patient and/or surrogate as well as nursing, discussions with consultants, evaluation of patient's  response to treatment, examination of patient, obtaining history from patient or surrogate, ordering and performing treatments and interventions, ordering and review of laboratory studies, ordering and review of radiographic studies, pulse oximetry and re-evaluation of patient's condition.   Final Clinical Impressions(s) / ED Diagnoses   Final diagnoses:  Traumatic pneumothorax, initial encounter  Closed fracture of multiple ribs of right side, initial encounter    ED Discharge Orders    None       Orpah Greek, MD 26-Dec-2017 [phone]

## 2017-12-17 NOTE — Progress Notes (Addendum)
Palliative Medicine RN Note: Consult order rec'd. Was at home w 24/7 caregivers & [facility]. Admitted after fall at home. Now w foley, chest tube, NRB. Daughter and CG at bedside.  I spoke with daughter. Goal is comfort. Pt's son is coming from [place] 1600, so she doesn't want to stop NRB at this time. She will likely be ready to stop it once he arrives. Updated RN [place] and Dr Grandville Silos. Obtained comfort orders from Dr Hilma Favors.  Updated Sherry w [facility]. There are no beds at [facility], so pt will not be moved today. Unfortunately, his prognosis is likely minutes to hours; PMT anticipates a hospital death.  PMT will follow up this afternoon to ensure continued comfort.  Marjie Skiff Jordayn Mink, RN, BSN, [facility] Palliative Medicine Team 12/16/2017 10:42 AM Office [phone]

## 2017-12-17 NOTE — ED Notes (Signed)
Chest tube pigtail inserted by Dr. Ninfa Linden, pt tolerated well, connected to sahara, tube secured to R chest.

## 2017-12-17 NOTE — Progress Notes (Signed)
[facility] MSW note: Patient is a current [facility] patient. Pt is a DNR. Pt lived at home with daughter. Daughter and hired private sitter-Pat at the bedside. Dr. Stevie Kern also present. Daughter aware of poor prognosis. Daughter comfortable with pt remaining in the hospital and not going to [facility] as time appears close. Son-Phillip is on his way from [place] and should be here @4pm . MSW offered support to daughter and sitter. MSW discussed importance of having closure with pt. Daughter acknowledged recent significant losses she has experienced. MSW offered support and active listening. MSW updated primary home care team.   Marilynne Halsted, MSW [phone]

## 2017-12-17 NOTE — H&P (Addendum)
Ronald Strong is an 84 y.o. male.   Chief Complaint: Respiratory distress after fall  HPI: Ronald Strong has a history of severe COPD and is on home hospice care.  He lives with his daughter and they have 24-hour nursing help.  He was just in the emergency room at [facility] long earlier this week and for urinary retention.  A Foley was placed at that time.  He got up and fell striking his right side.  No loss of consciousness.  He was transported as a level 2 trauma but immediately upgraded due to subcutaneous emphysema on arrival.  An emergent chest tube was placed by my partner Dr. Ninfa Linden and he underwent further evaluation with CAT scans.  Past medical history: On hospice for COPD No family history on file. Social History:  has no tobacco, alcohol, and drug history on file.  Allergies:  Allergies  Allergen Reactions  . Bee Venom   . Epinephrine (Anaphylaxis)      (Not in a hospital admission)  Results for orders placed or performed during the hospital encounter of 12/02/2017 (from the past 48 hour(s))  Type and screen Ordered by PROVIDER DEFAULT     Status: None   Collection Time: 12/12/2017  5:50 AM  Result Value Ref Range   ABO/RH(D) O POS    Antibody Screen NEG    Sample Expiration 12/20/2017    Unit Number [id number]    Blood Component Type RCLI PHER 2    Unit division 00    Status of Unit REL FROM [facility]    Unit tag comment VERBAL ORDERS PER DR POLLINA    Transfusion Status OK TO TRANSFUSE    Crossmatch Result COMPATIBLE    Unit Number [id number]    Blood Component Type RCLI PHER 1    Unit division 00    Status of Unit REL FROM [facility]    Unit tag comment VERBAL ORDERS PER DR POLLINA    Transfusion Status OK TO TRANSFUSE    Crossmatch Result COMPATIBLE   Prepare fresh frozen plasma     Status: None   Collection Time: 12/20/2017  5:50 AM  Result Value Ref Range   Unit Number [id number]    Blood Component Type LIQ PLASMA    Unit division 00    Status of Unit REL FROM [facility]      Unit tag comment VERBAL ORDERS PER DR POLLINA    Transfusion Status OK TO TRANSFUSE    Unit Number [id number]    Blood Component Type LIQ PLASMA    Unit division 00    Status of Unit REL FROM [facility]    Unit tag comment VERBAL ORDERS PER DR POLLINA    Transfusion Status      OK TO TRANSFUSE Performed at [facility], [address] [place], [place] [address]   Comprehensive metabolic panel     Status: Abnormal   Collection Time: 12/28/2017  5:50 AM  Result Value Ref Range   Sodium 140 135 - 145 mmol/L   Potassium 4.2 3.5 - 5.1 mmol/L   Chloride 103 98 - 111 mmol/L   CO2 30 22 - 32 mmol/L   Glucose, Bld 127 (H) 70 - 99 mg/dL   BUN 29 (H) 8 - 23 mg/dL   Creatinine, Ser 1.80 (H) 0.61 - 1.24 mg/dL   Calcium 9.9 8.9 - 10.3 mg/dL   Total Protein 5.7 (L) 6.5 - 8.1 g/dL   Albumin 3.4 (L) 3.5 - 5.0 g/dL   AST 28  15 - 41 U/L   ALT 15 0 - 44 U/L   Alkaline Phosphatase 56 38 - 126 U/L   Total Bilirubin 1.0 0.3 - 1.2 mg/dL   GFR calc non Af Amer 31 (L) >60 mL/min   GFR calc Af Amer 35 (L) >60 mL/min    Comment: (NOTE) The eGFR has been calculated using the CKD EPI equation. This calculation has not been validated in all clinical situations. eGFR's persistently <60 mL/min signify possible Chronic Kidney Disease.    Anion gap 7 5 - 15    Comment: Performed at [facility] [address], [place], [place] [address]  CBC     Status: Abnormal   Collection Time: 12/09/2017  5:50 AM  Result Value Ref Range   WBC 20.7 (H) 4.0 - 10.5 K/uL   RBC 3.87 (L) 4.22 - 5.81 MIL/uL   Hemoglobin 12.4 (L) 13.0 - 17.0 g/dL   HCT 40.1 39.0 - 52.0 %   MCV 103.6 (H) 78.0 - 100.0 fL   MCH 32.0 26.0 - 34.0 pg   MCHC 30.9 30.0 - 36.0 g/dL   RDW 12.4 11.5 - 15.5 %   Platelets 212 150 - 400 K/uL    Comment: Performed at [facility], [place] [address], [place], [place] [address]  Ethanol     Status: None   Collection Time: 12/09/2017  5:50 AM  Result Value Ref Range   Alcohol, Ethyl (B) <10  <10 mg/dL    Comment: (NOTE) Lowest detectable limit for serum alcohol is 10 mg/dL. For medical purposes only. Performed at [facility], [place] [address] [place], [place] [address]   Protime-INR     Status: Abnormal   Collection Time: 12/27/2017  5:50 AM  Result Value Ref Range   Prothrombin Time 15.5 (H) 11.4 - 15.2 seconds   INR 1.24     Comment: Performed at [facility] [address] [place], [place] [address]  ABO/Rh     Status: None   Collection Time: 12/14/2017  5:50 AM  Result Value Ref Range   ABO/RH(D)      O POS Performed at [facility] [address], [place], [place] [address]   I-Stat Chem 8, ED     Status: Abnormal   Collection Time: 12/26/2017  6:02 AM  Result Value Ref Range   Sodium 138 135 - 145 mmol/L   Potassium 4.1 3.5 - 5.1 mmol/L   Chloride 100 98 - 111 mmol/L   BUN 31 (H) 8 - 23 mg/dL   Creatinine, Ser 1.70 (H) 0.61 - 1.24 mg/dL   Glucose, Bld 120 (H) 70 - 99 mg/dL   Calcium, Ion 1.35 1.15 - 1.40 mmol/L   TCO2 28 22 - 32 mmol/L   Hemoglobin 12.9 (L) 13.0 - 17.0 g/dL   HCT 38.0 (L) 39.0 - 52.0 %  I-Stat CG4 Lactic Acid, ED     Status: None   Collection Time: 12/08/2017  6:03 AM  Result Value Ref Range   Lactic Acid, Venous 1.02 0.5 - 1.9 mmol/L   Ct Head Wo Contrast  Result Date: 12/01/2017 CLINICAL DATA:  Level 1 trauma. Fell at home with chest deformity. EXAM: CT HEAD WITHOUT CONTRAST CT CERVICAL SPINE WITHOUT CONTRAST TECHNIQUE: Multidetector CT imaging of the head and cervical spine was performed following the standard protocol without intravenous contrast. Multiplanar CT image reconstructions of the cervical spine were also generated. COMPARISON:  04/04/2017 FINDINGS: CT HEAD  FINDINGS Brain: Diffuse cerebral atrophy. Low-attenuation changes throughout the deep white matter consistent with small vessel ischemia. No mass-effect or midline shift. No abnormal extra-axial fluid collections. Gray-white matter junctions are distinct. Basal  cisterns are not effaced. No acute intracranial hemorrhage. Vascular: Mild intracranial arterial vascular calcifications are present. Skull: Calvarium appears intact. Sinuses/Orbits: Paranasal sinuses and mastoid air cells are clear. Other: Soft tissue emphysema demonstrated in the right side of the neck. CT CERVICAL SPINE FINDINGS Alignment: Slight anterior subluxation of C7 on T1 and T1 on T2. Alignment is unchanged since previous study. Normal alignment of the facet joints. C1-2 articulation appears intact. Skull base and vertebrae: Skull base appears intact. Soft tissues and spinal canal: No vertebral compression deformities. No focal bone lesion or bone destruction. Disc levels: Severe degenerative changes throughout the cervical spine with narrowed interspaces, endplate hypertrophic changes, and endplate erosions. Most prominent at C4-5, C5-6, and C6-7 levels. Prominent degenerative changes at C1-2 as well. Degenerative changes in the cervical facet joints. Upper chest: Motion artifact limits examination. There is a right apical pneumothorax with subcutaneous emphysema in the right supraclavicular region extending up into the right side of the neck. Other: None. IMPRESSION: 1. No acute intracranial abnormalities. Chronic atrophy and small vessel ischemic changes. 2. Severe degenerative change throughout the cervical spine. No change in alignment since previous study. 3. No acute displaced fractures identified. 4. Incidental note of a right apical pneumothorax with subcutaneous emphysema in the right supraclavicular region and right neck. See additional report of CT chest. These results were called by telephone at the time of interpretation on 12/14/2017 at 6:55 am to Dr. Rush Farmer, who verbally acknowledged these results. Electronically Signed   By: Lucienne Capers M.D.   On: 12/04/2017 06:56   Ct Chest W Contrast  Result Date: 12/26/2017 CLINICAL DATA:  Level 1 trauma. Fell at home. EXAM: CT CHEST,  ABDOMEN, AND PELVIS WITH CONTRAST TECHNIQUE: Multidetector CT imaging of the chest, abdomen and pelvis was performed following the standard protocol during bolus administration of intravenous contrast. CONTRAST:  154m ISOVUE-300 IOPAMIDOL (ISOVUE-300) INJECTION 61% COMPARISON:  None. FINDINGS: CT CHEST FINDINGS Cardiovascular: Normal heart size. No pericardial effusion. Coronary artery calcifications. Normal caliber thoracic aorta with scattered calcifications. Great vessel origins are patent. No mediastinal hematoma identified. Mediastinum/Nodes: Esophagus is mostly decompressed. Small amount of fluid and air in the esophagus without distention likely due to reflux or dysmotility. Possible small diverticulum of the cervical esophagus. Lungs/Pleura: Small to moderate right pneumothorax without tension. Small right pleural effusion. Basilar atelectasis or consolidation on the right. Patchy areas of infiltration in both upper lungs may be due to contusion or atelectasis. A right chest tube is present. Musculoskeletal: Extensive subcutaneous emphysema in the right chest and extending into the right side of the neck. Lesser amount of subcutaneous emphysema in the anterior left chest wall. Soft tissue emphysema extends into the right arm. Multiple displaced right rib fractures including the seventh, eighth, ninth, tenth ribs. Left ribs appear intact. Comminuted nondisplaced fractures of the medial right clavicular head. Sternum and thoracic spine appear intact. Bridging anterior osteophytes throughout the thoracic spine. CT ABDOMEN PELVIS FINDINGS Hepatobiliary: Multiple hepatic cysts versus dilated bile ducts. Gallbladder is distended without wall thickening. No stones identified. No bile duct dilatation. Cyst in the caudate lobe of the liver measures 6.6 cm diameter. No evidence of hepatic laceration or hematoma. Pancreas: Unremarkable. No pancreatic ductal dilatation or surrounding inflammatory changes. Spleen: No  splenic injury or perisplenic hematoma. Adrenals/Urinary Tract: No adrenal hemorrhage  or renal injury identified. Cyst in the lower pole right kidney. There is a Foley catheter present. What is presumably the bladder represents a 11.6 x 8.9 cm diameter area of soft tissue density. This could represent a large mass obliterating the bladder or heterogeneous hemorrhage. Delayed CT imaging or cystoscopy is suggested for further evaluation. No extraluminal hemorrhage is suggested. Stomach/Bowel: Stomach, small bowel, and colon are mostly decompressed with scattered stool in the colon. Diverticulosis of the colon without evidence of inflammatory change. No bowel wall thickening is appreciated although under distention limits evaluation. Appendix is not identified. Vascular/Lymphatic: Aortic atherosclerosis. No enlarged abdominal or pelvic lymph nodes. Reproductive: Prostate gland appears normal size although con newly with the abnormal appearing bladder is not excluded. Visualization is limited due to streak artifact from right hip arthroplasty. Other: No free air or free fluid in the abdomen. Soft tissue emphysema extends along the right lateral abdominal wall down to the pelvis and into the anterior pelvis and inguinal/scrotal regions. Musculoskeletal: Degenerative changes throughout the thoracic spine. Normal alignment. Right hip arthroplasty. Sacrum, pelvis, and hips appear intact. IMPRESSION: 1. Small to moderate right pneumothorax without tension. Right chest tube in place. Multiple displaced right rib fractures. Basilar atelectasis or consolidation on the right. Small right pleural effusion. No mediastinal hematoma. Probable pulmonary contusions. 2. Extensive subcutaneous emphysema in the right chest and abdomen extending into the right arm and down to the scrotum. No evidence of solid organ injury or bowel perforation. 3. Large area of soft tissue density corresponding to the bladder with Foley catheter inside.  This could represent a large mass obliterating the bladder or heterogeneous hemorrhage within the bladder. Delayed CT imaging or cystoscopy is suggested for further evaluation. 4. Multiple hepatic cysts versus dilated bile ducts. 5. Aortic atherosclerosis Emphysema (ICD10-J43.9). These results were called by telephone at the time of interpretation on 12/09/2017 at 7:07 am to Dr. Grandville Silos , who verbally acknowledged these results. Electronically Signed   By: Lucienne Capers M.D.   On: 12/01/2017 07:14   Ct Cervical Spine Wo Contrast  Result Date: 12/02/2017 CLINICAL DATA:  Level 1 trauma. Fell at home with chest deformity. EXAM: CT HEAD WITHOUT CONTRAST CT CERVICAL SPINE WITHOUT CONTRAST TECHNIQUE: Multidetector CT imaging of the head and cervical spine was performed following the standard protocol without intravenous contrast. Multiplanar CT image reconstructions of the cervical spine were also generated. COMPARISON:  04/04/2017 FINDINGS: CT HEAD FINDINGS Brain: Diffuse cerebral atrophy. Low-attenuation changes throughout the deep white matter consistent with small vessel ischemia. No mass-effect or midline shift. No abnormal extra-axial fluid collections. Gray-white matter junctions are distinct. Basal cisterns are not effaced. No acute intracranial hemorrhage. Vascular: Mild intracranial arterial vascular calcifications are present. Skull: Calvarium appears intact. Sinuses/Orbits: Paranasal sinuses and mastoid air cells are clear. Other: Soft tissue emphysema demonstrated in the right side of the neck. CT CERVICAL SPINE FINDINGS Alignment: Slight anterior subluxation of C7 on T1 and T1 on T2. Alignment is unchanged since previous study. Normal alignment of the facet joints. C1-2 articulation appears intact. Skull base and vertebrae: Skull base appears intact. Soft tissues and spinal canal: No vertebral compression deformities. No focal bone lesion or bone destruction. Disc levels: Severe degenerative changes  throughout the cervical spine with narrowed interspaces, endplate hypertrophic changes, and endplate erosions. Most prominent at C4-5, C5-6, and C6-7 levels. Prominent degenerative changes at C1-2 as well. Degenerative changes in the cervical facet joints. Upper chest: Motion artifact limits examination. There is a right apical pneumothorax with subcutaneous  emphysema in the right supraclavicular region extending up into the right side of the neck. Other: None. IMPRESSION: 1. No acute intracranial abnormalities. Chronic atrophy and small vessel ischemic changes. 2. Severe degenerative change throughout the cervical spine. No change in alignment since previous study. 3. No acute displaced fractures identified. 4. Incidental note of a right apical pneumothorax with subcutaneous emphysema in the right supraclavicular region and right neck. See additional report of CT chest. These results were called by telephone at the time of interpretation on 12/23/2017 at 6:55 am to Dr. Rush Farmer, who verbally acknowledged these results. Electronically Signed   By: Lucienne Capers M.D.   On: 12/29/2017 06:56   Ct Abdomen Pelvis W Contrast  Result Date: 12/21/2017 CLINICAL DATA:  Level 1 trauma. Fell at home. EXAM: CT CHEST, ABDOMEN, AND PELVIS WITH CONTRAST TECHNIQUE: Multidetector CT imaging of the chest, abdomen and pelvis was performed following the standard protocol during bolus administration of intravenous contrast. CONTRAST:  149m ISOVUE-300 IOPAMIDOL (ISOVUE-300) INJECTION 61% COMPARISON:  None. FINDINGS: CT CHEST FINDINGS Cardiovascular: Normal heart size. No pericardial effusion. Coronary artery calcifications. Normal caliber thoracic aorta with scattered calcifications. Great vessel origins are patent. No mediastinal hematoma identified. Mediastinum/Nodes: Esophagus is mostly decompressed. Small amount of fluid and air in the esophagus without distention likely due to reflux or dysmotility. Possible small diverticulum  of the cervical esophagus. Lungs/Pleura: Small to moderate right pneumothorax without tension. Small right pleural effusion. Basilar atelectasis or consolidation on the right. Patchy areas of infiltration in both upper lungs may be due to contusion or atelectasis. A right chest tube is present. Musculoskeletal: Extensive subcutaneous emphysema in the right chest and extending into the right side of the neck. Lesser amount of subcutaneous emphysema in the anterior left chest wall. Soft tissue emphysema extends into the right arm. Multiple displaced right rib fractures including the seventh, eighth, ninth, tenth ribs. Left ribs appear intact. Comminuted nondisplaced fractures of the medial right clavicular head. Sternum and thoracic spine appear intact. Bridging anterior osteophytes throughout the thoracic spine. CT ABDOMEN PELVIS FINDINGS Hepatobiliary: Multiple hepatic cysts versus dilated bile ducts. Gallbladder is distended without wall thickening. No stones identified. No bile duct dilatation. Cyst in the caudate lobe of the liver measures 6.6 cm diameter. No evidence of hepatic laceration or hematoma. Pancreas: Unremarkable. No pancreatic ductal dilatation or surrounding inflammatory changes. Spleen: No splenic injury or perisplenic hematoma. Adrenals/Urinary Tract: No adrenal hemorrhage or renal injury identified. Cyst in the lower pole right kidney. There is a Foley catheter present. What is presumably the bladder represents a 11.6 x 8.9 cm diameter area of soft tissue density. This could represent a large mass obliterating the bladder or heterogeneous hemorrhage. Delayed CT imaging or cystoscopy is suggested for further evaluation. No extraluminal hemorrhage is suggested. Stomach/Bowel: Stomach, small bowel, and colon are mostly decompressed with scattered stool in the colon. Diverticulosis of the colon without evidence of inflammatory change. No bowel wall thickening is appreciated although under distention  limits evaluation. Appendix is not identified. Vascular/Lymphatic: Aortic atherosclerosis. No enlarged abdominal or pelvic lymph nodes. Reproductive: Prostate gland appears normal size although con newly with the abnormal appearing bladder is not excluded. Visualization is limited due to streak artifact from right hip arthroplasty. Other: No free air or free fluid in the abdomen. Soft tissue emphysema extends along the right lateral abdominal wall down to the pelvis and into the anterior pelvis and inguinal/scrotal regions. Musculoskeletal: Degenerative changes throughout the thoracic spine. Normal alignment. Right hip arthroplasty. Sacrum, pelvis,  and hips appear intact. IMPRESSION: 1. Small to moderate right pneumothorax without tension. Right chest tube in place. Multiple displaced right rib fractures. Basilar atelectasis or consolidation on the right. Small right pleural effusion. No mediastinal hematoma. Probable pulmonary contusions. 2. Extensive subcutaneous emphysema in the right chest and abdomen extending into the right arm and down to the scrotum. No evidence of solid organ injury or bowel perforation. 3. Large area of soft tissue density corresponding to the bladder with Foley catheter inside. This could represent a large mass obliterating the bladder or heterogeneous hemorrhage within the bladder. Delayed CT imaging or cystoscopy is suggested for further evaluation. 4. Multiple hepatic cysts versus dilated bile ducts. 5. Aortic atherosclerosis Emphysema (ICD10-J43.9). These results were called by telephone at the time of interpretation on 12/28/2017 at 7:07 am to Dr. Grandville Silos , who verbally acknowledged these results. Electronically Signed   By: Lucienne Capers M.D.   On: 12/25/2017 07:14   Dg Chest Portable 1 View  Result Date: 12/01/2017 CLINICAL DATA:  Initial evaluation status post chest tube placement. EXAM: PORTABLE CHEST 1 VIEW COMPARISON:  Prior radiograph from earlier the same day.  FINDINGS: Stable cardiomegaly. Mediastinal silhouette normal. Aortic atherosclerosis. No mediastinal shift. Interval placement of right-sided pigtail chest tube with tip overlying the right hilar region. Persistent right apical pneumothorax, similar to perhaps slightly decreased in size with pleural edge overlying the right third rib. Overall volume approximately 10-15%. Opacity within the right lung likely reflects atelectatic changes, congestion, and possibly small amount of hemorrhage. Left lung remains clear. Extensive emphysema again seen throughout the right chest wall. Acute fracture of the right lateral sixth, seventh, and likely eighth ribs. IMPRESSION: 1. Interval placement of right-sided pigtail chest tube with tip overlying the right hilum. Persistent right apical pneumothorax with pleural edge overlying the right third rib (approximately 10-15% in volume). No mediastinal shift. 2. Acute mildly displaced fractures of the right lateral sixth and seventh ribs, and possibly eighth rib. 3. Extensive soft tissue emphysema throughout the right chest wall. Electronically Signed   By: Jeannine Boga M.D.   On: 12/24/2017 06:32   Dg Chest Portable 1 View  Result Date: 12/16/2017 CLINICAL DATA:  Initial evaluation for acute trauma, flail chest. EXAM: PORTABLE CHEST 1 VIEW COMPARISON:  Prior radiograph from 04/04/2017. FINDINGS: Cardiac and mediastinal silhouettes are stable in size and contour, and remain within normal limits. Aortic atherosclerosis. Lungs are hypoinflated. Right apical pneumothorax is visualized, pleural edge at the fourth intercostal airspace. Scattered opacity throughout the right lung likely reflects atelectatic changes and/or congestion. Mild scattered atelectatic changes at the left lung base. Left lung is otherwise clear. Extensive soft tissue emphysema throughout the right lateral chest wall. No appreciable displaced rib fracture on this limited single frontal view of the chest.  IMPRESSION: 1. Acute right apical pneumothorax, pleural edge at the right fourth intercostal airspace. 2. Scattered opacity throughout the right lung, likely associated atelectasis and/or congestion. 3. Extensive soft tissue emphysema within the right lateral chest wall. Critical Value/emergent results were called by telephone at the time of interpretation on 12/06/2017 at 5:56 am to Dr. Joseph Berkshire , who verbally acknowledged these results. Electronically Signed   By: Jeannine Boga M.D.   On: 12/21/2017 05:56    Review of Systems  Unable to perform ROS: Critical illness    Blood pressure 116/63, pulse 89, temperature (!) 97.4 F (36.3 C), temperature source Tympanic, resp. rate 11, height _0  (1.676 m), weight 47.2 kg, SpO2 95 %. Physical  Exam  Constitutional: He appears well-developed. He appears lethargic.  HENT:  Head: Normocephalic.  Right Ear: External ear normal.  Left Ear: External ear normal.  Mouth/Throat: Oropharynx is clear and moist.  Eyes: Pupils are equal, round, and reactive to light. Conjunctivae are normal.  Neck: No tracheal deviation present. No thyromegaly present.  Cardiovascular: Normal rate, regular rhythm and normal heart sounds.  Respiratory: He is in respiratory distress. He has wheezes. He has no rales. He exhibits tenderness.  Extensive right-sided subcutaneous emphysema extending from his right neck down to his right scrotum  GI: He exhibits distension. There is no tenderness. There is no rebound and no guarding.  Genitourinary:  Genitourinary Comments: Scrotal subcutaneous emphysema, Foley in place  Musculoskeletal: He exhibits no tenderness or deformity.  Neurological: He appears lethargic. He displays no atrophy and no tremor. He exhibits normal muscle tone. He displays no seizure activity. GCS eye subscore is 2. GCS verbal subscore is 1. GCS motor subscore is 5.  Skin:     Small abrasion right back     Assessment/Plan Fall  Right  rib fractures 7 through 10 with pneumothorax and extensive subcutaneous emphysema -chest tube to -20  History of home hospice for COPD -this is a terminal event  R clavicle FX  Bladder outlet obstruction from bladder tumor -continue Foley  I had a long talk with his daughter.  He is a DNR.  Will admit to [facility] tele for palliative care evaluation.  His son is in route from [place] at this time.  We will focus on comfort care and I have consulted the palliative service.  Total critical care time 42 minutes including evaluation of radiographic studies and goals of care meeting with family.    Zenovia Jarred, MD 12/02/2017, 8:02 AM

## 2017-12-17 NOTE — Social Work (Signed)
CSW acknowledging pt arrival to 6N, pt comfort care and not appropriate for SBIRT. Will follow for any support needs, disposition support.   Alexander Mt, [place] Work [phone]

## 2017-12-17 NOTE — ED Notes (Signed)
PT's visitor has requested a visit from a catholic priest. Bonney Roussel made aware and will speak to PT and family shortly.

## 2017-12-17 NOTE — Progress Notes (Signed)
Chaplain visited with the PT daughter that was present at the bedside.  [name] was called from [place] to administer anointing.

## 2017-12-17 NOTE — Telephone Encounter (Signed)
Copied from [place] [id number]. Topic: Inquiry >> Dec 17, 2017  9:03 AM Oliver Pila B wrote: Reason for CRM: hospice and palliative care called for orders for re-certification which will occur Sept 5th - Nov 3rd; verbals of start of care 8.17 pt needed a catheter 8.19 pt had a fall, been admitted to hospital  Contact: [phone] (sam king)

## 2017-12-17 NOTE — ED Notes (Signed)
No Diet was ordered for Lunch, Patient is on Comfort care.

## 2017-12-17 NOTE — ED Triage Notes (Signed)
Dr. Ninfa Linden at bedside preparing for chest tub insertion

## 2017-12-17 NOTE — ED Notes (Signed)
Family at bedside. 

## 2017-12-17 NOTE — ED Triage Notes (Signed)
Pt transported from [place], pt had unwitnessed fall about 1 hour PTA, staff assisted pt back to bed, pt began having swelling to R side of chest.  Crepitus noted to R chest, swelling noted approximately the size of a soccer ball.  #18 L post FA, Fentanyl 143mcg gvien by EMS, pt on NRB sat 97%, pt responds to verbal stimuli.  Family aware, pt is DNR

## 2017-12-18 LAB — TYPE AND SCREEN
ABO/RH(D): O POS
Antibody Screen: NEGATIVE
UNIT DIVISION: 0
Unit division: 0

## 2017-12-18 LAB — BPAM RBC
Blood Product Expiration Date: 201909052359
Blood Product Expiration Date: 201909052359
ISSUE DATE / TIME: 201908191542
ISSUE DATE / TIME: 201908191542
UNIT TYPE AND RH: 9500
Unit Type and Rh: 9500

## 2017-12-18 LAB — BLOOD PRODUCT ORDER (VERBAL) VERIFICATION

## 2017-12-18 MED ORDER — HYDROMORPHONE HCL 1 MG/ML IJ SOLN
0.5000 mg | INTRAMUSCULAR | Status: AC
  Administered 2017-12-18: 0.5 mg via INTRAVENOUS
  Filled 2017-12-18: qty 1

## 2017-12-18 MED ORDER — HYDROMORPHONE HCL 1 MG/ML IJ SOLN: 1.0000 mg | INTRAMUSCULAR | Status: DC | PRN

## 2017-12-30 NOTE — Progress Notes (Signed)
Patient admitted  From E.D. S/P fall from home. Right pneumothorax. Chest tube connected to suction. Non rebreather  Face mask.  Unresponsive. Plan for comfort care only. Daughter and private duty caregiver at bedside.

## 2017-12-30 NOTE — Progress Notes (Addendum)
Once ME releases body, funeral home is [facility] in [place], [place] (in the [place]; [address] [place], [place] [address]). Their phone number is [phone]. They will coordinate transport to [facility]  Marjie Skiff Samanthan Dugo, RN, BSN, [facility] Palliative Medicine Team 12/26/17 10:09 AM Office [phone]  ADDENDUM: Notified Audrea Muscat w [facility] of death at 64.  Marjie Skiff Edyn Qazi, RN, BSN, [facility] Palliative Medicine Team 12-26-17 10:11 AM Office [phone]

## 2017-12-30 NOTE — Telephone Encounter (Signed)
Fax from Hospice has been received, spoke with hospice nurse who stated that the forms are for the pt recertification an needs to be signed by Dr Sherren Mocha and faxed back Hospice nurse is aware that Dr Sherren Mocha is out of the office,the nurse stated that patient passed away at the facility earlier this morning.

## 2017-12-30 NOTE — Progress Notes (Signed)
Palliative Medicine RN Note: Rec'd call that pt died. I spoke with Wynona Canes, ME on call; this will be an ME case.   Mr Minion will need to be seen by the ME. He requested that pt be sent to morgue in a bag with an extra page of stickers. I will inform family of the delay in releasing pt to funeral home. I spoke with AND Blanch Media on 6N to update her.  Marjie Skiff Dyan Labarbera, RN, BSN, [facility] Palliative Medicine Team 12/21/2017 9:44 AM Office [phone]

## 2017-12-30 NOTE — Progress Notes (Signed)
[facility] Progress Note     Subjective: CC-  Patient resting comfortably, multiple family members at bedside. States that he slept well throughout the night.  Objective: Vital signs in last 24 hours: Pulse Rate:  [66-82] 66 (08/19 1357) Resp:  [8-19] 8 (08/19 1357) BP: (102-128)/(54-66) 102/54 (08/19 1357) SpO2:  [91 %-100 %] 100 % (08/19 1357)    Intake/Output from previous day: 08/19 0701 - 08/20 0700 In: 54.9 [I.V.:54.9] Out: 128 [Chest Tube:128] Intake/Output this shift: No intake/output data recorded.  PE: Gen:  Sleeping HEENT: eyes closed Card:  tachy Pulm:  Few rhonchi, bradypnea, on NRB Abd: Soft, NT/ND, +BS Psych:unable to assess, lethargic Skin: no rashes noted, warm and dry  Lab Results:  Recent Labs    12/08/2017 0550 12/21/2017 0602  WBC 20.7*  --   HGB 12.4* 12.9*  HCT 40.1 38.0*  PLT 212  --    BMET Recent Labs    12/29/2017 0550 12/16/2017 0602  NA 140 138  K 4.2 4.1  CL 103 100  CO2 30  --   GLUCOSE 127* 120*  BUN 29* 31*  CREATININE 1.80* 1.70*  CALCIUM 9.9  --    PT/INR Recent Labs    12/11/2017 0550  LABPROT 15.5*  INR 1.24   CMP     Component Value Date/Time   NA 138 12/12/2017 0602   K 4.1 11/30/2017 0602   CL 100 12/28/2017 0602   CO2 30 12/08/2017 0550   GLUCOSE 120 (H) 11/29/2017 0602   BUN 31 (H) 12/09/2017 0602   CREATININE 1.70 (H) 12/21/2017 0602   CALCIUM 9.9 12/12/2017 0550   PROT 5.7 (L) 12/29/2017 0550   ALBUMIN 3.4 (L) 12/05/2017 0550   AST 28 12/09/2017 0550   ALT 15 12/11/2017 0550   ALKPHOS 56 12/08/2017 0550   BILITOT 1.0 12/02/2017 0550   GFRNONAA 31 (L) 12/14/2017 0550   GFRAA 35 (L) 12/28/2017 0550   Lipase  No results found for: LIPASE     Studies/Results: Ct Head Wo Contrast  Result Date: 12/23/2017 CLINICAL DATA:  Level 1 trauma. Fell at home with chest deformity. EXAM: CT HEAD WITHOUT CONTRAST CT CERVICAL SPINE WITHOUT CONTRAST TECHNIQUE: Multidetector CT imaging of the head  and cervical spine was performed following the standard protocol without intravenous contrast. Multiplanar CT image reconstructions of the cervical spine were also generated. COMPARISON:  04/04/2017 FINDINGS: CT HEAD FINDINGS Brain: Diffuse cerebral atrophy. Low-attenuation changes throughout the deep white matter consistent with small vessel ischemia. No mass-effect or midline shift. No abnormal extra-axial fluid collections. Gray-white matter junctions are distinct. Basal cisterns are not effaced. No acute intracranial hemorrhage. Vascular: Mild intracranial arterial vascular calcifications are present. Skull: Calvarium appears intact. Sinuses/Orbits: Paranasal sinuses and mastoid air cells are clear. Other: Soft tissue emphysema demonstrated in the right side of the neck. CT CERVICAL SPINE FINDINGS Alignment: Slight anterior subluxation of C7 on T1 and T1 on T2. Alignment is unchanged since previous study. Normal alignment of the facet joints. C1-2 articulation appears intact. Skull base and vertebrae: Skull base appears intact. Soft tissues and spinal canal: No vertebral compression deformities. No focal bone lesion or bone destruction. Disc levels: Severe degenerative changes throughout the cervical spine with narrowed interspaces, endplate hypertrophic changes, and endplate erosions. Most prominent at C4-5, C5-6, and C6-7 levels. Prominent degenerative changes at C1-2 as well. Degenerative changes in the cervical facet joints. Upper chest: Motion artifact limits examination. There is a right apical pneumothorax with subcutaneous emphysema in the  right supraclavicular region extending up into the right side of the neck. Other: None. IMPRESSION: 1. No acute intracranial abnormalities. Chronic atrophy and small vessel ischemic changes. 2. Severe degenerative change throughout the cervical spine. No change in alignment since previous study. 3. No acute displaced fractures identified. 4. Incidental note of a right  apical pneumothorax with subcutaneous emphysema in the right supraclavicular region and right neck. See additional report of CT chest. These results were called by telephone at the time of interpretation on 12/04/2017 at 6:55 am to Dr. Rush Farmer, who verbally acknowledged these results. Electronically Signed   By: Lucienne Capers M.D.   On: 12/20/2017 06:56   Ct Chest W Contrast  Result Date: 12/24/2017 CLINICAL DATA:  Level 1 trauma. Fell at home. EXAM: CT CHEST, ABDOMEN, AND PELVIS WITH CONTRAST TECHNIQUE: Multidetector CT imaging of the chest, abdomen and pelvis was performed following the standard protocol during bolus administration of intravenous contrast. CONTRAST:  140mL ISOVUE-300 IOPAMIDOL (ISOVUE-300) INJECTION 61% COMPARISON:  None. FINDINGS: CT CHEST FINDINGS Cardiovascular: Normal heart size. No pericardial effusion. Coronary artery calcifications. Normal caliber thoracic aorta with scattered calcifications. Great vessel origins are patent. No mediastinal hematoma identified. Mediastinum/Nodes: Esophagus is mostly decompressed. Small amount of fluid and air in the esophagus without distention likely due to reflux or dysmotility. Possible small diverticulum of the cervical esophagus. Lungs/Pleura: Small to moderate right pneumothorax without tension. Small right pleural effusion. Basilar atelectasis or consolidation on the right. Patchy areas of infiltration in both upper lungs may be due to contusion or atelectasis. A right chest tube is present. Musculoskeletal: Extensive subcutaneous emphysema in the right chest and extending into the right side of the neck. Lesser amount of subcutaneous emphysema in the anterior left chest wall. Soft tissue emphysema extends into the right arm. Multiple displaced right rib fractures including the seventh, eighth, ninth, tenth ribs. Left ribs appear intact. Comminuted nondisplaced fractures of the medial right clavicular head. Sternum and thoracic spine appear  intact. Bridging anterior osteophytes throughout the thoracic spine. CT ABDOMEN PELVIS FINDINGS Hepatobiliary: Multiple hepatic cysts versus dilated bile ducts. Gallbladder is distended without wall thickening. No stones identified. No bile duct dilatation. Cyst in the caudate lobe of the liver measures 6.6 cm diameter. No evidence of hepatic laceration or hematoma. Pancreas: Unremarkable. No pancreatic ductal dilatation or surrounding inflammatory changes. Spleen: No splenic injury or perisplenic hematoma. Adrenals/Urinary Tract: No adrenal hemorrhage or renal injury identified. Cyst in the lower pole right kidney. There is a Foley catheter present. What is presumably the bladder represents a 11.6 x 8.9 cm diameter area of soft tissue density. This could represent a large mass obliterating the bladder or heterogeneous hemorrhage. Delayed CT imaging or cystoscopy is suggested for further evaluation. No extraluminal hemorrhage is suggested. Stomach/Bowel: Stomach, small bowel, and colon are mostly decompressed with scattered stool in the colon. Diverticulosis of the colon without evidence of inflammatory change. No bowel wall thickening is appreciated although under distention limits evaluation. Appendix is not identified. Vascular/Lymphatic: Aortic atherosclerosis. No enlarged abdominal or pelvic lymph nodes. Reproductive: Prostate gland appears normal size although con newly with the abnormal appearing bladder is not excluded. Visualization is limited due to streak artifact from right hip arthroplasty. Other: No free air or free fluid in the abdomen. Soft tissue emphysema extends along the right lateral abdominal wall down to the pelvis and into the anterior pelvis and inguinal/scrotal regions. Musculoskeletal: Degenerative changes throughout the thoracic spine. Normal alignment. Right hip arthroplasty. Sacrum, pelvis, and hips appear intact.  IMPRESSION: 1. Small to moderate right pneumothorax without tension.  Right chest tube in place. Multiple displaced right rib fractures. Basilar atelectasis or consolidation on the right. Small right pleural effusion. No mediastinal hematoma. Probable pulmonary contusions. 2. Extensive subcutaneous emphysema in the right chest and abdomen extending into the right arm and down to the scrotum. No evidence of solid organ injury or bowel perforation. 3. Large area of soft tissue density corresponding to the bladder with Foley catheter inside. This could represent a large mass obliterating the bladder or heterogeneous hemorrhage within the bladder. Delayed CT imaging or cystoscopy is suggested for further evaluation. 4. Multiple hepatic cysts versus dilated bile ducts. 5. Aortic atherosclerosis Emphysema (ICD10-J43.9). These results were called by telephone at the time of interpretation on 12/16/2017 at 7:07 am to Dr. Grandville Silos , who verbally acknowledged these results. Electronically Signed   By: Lucienne Capers M.D.   On: 12/10/2017 07:14   Ct Cervical Spine Wo Contrast  Result Date: 12/03/2017 CLINICAL DATA:  Level 1 trauma. Fell at home with chest deformity. EXAM: CT HEAD WITHOUT CONTRAST CT CERVICAL SPINE WITHOUT CONTRAST TECHNIQUE: Multidetector CT imaging of the head and cervical spine was performed following the standard protocol without intravenous contrast. Multiplanar CT image reconstructions of the cervical spine were also generated. COMPARISON:  04/04/2017 FINDINGS: CT HEAD FINDINGS Brain: Diffuse cerebral atrophy. Low-attenuation changes throughout the deep white matter consistent with small vessel ischemia. No mass-effect or midline shift. No abnormal extra-axial fluid collections. Gray-white matter junctions are distinct. Basal cisterns are not effaced. No acute intracranial hemorrhage. Vascular: Mild intracranial arterial vascular calcifications are present. Skull: Calvarium appears intact. Sinuses/Orbits: Paranasal sinuses and mastoid air cells are clear. Other: Soft  tissue emphysema demonstrated in the right side of the neck. CT CERVICAL SPINE FINDINGS Alignment: Slight anterior subluxation of C7 on T1 and T1 on T2. Alignment is unchanged since previous study. Normal alignment of the facet joints. C1-2 articulation appears intact. Skull base and vertebrae: Skull base appears intact. Soft tissues and spinal canal: No vertebral compression deformities. No focal bone lesion or bone destruction. Disc levels: Severe degenerative changes throughout the cervical spine with narrowed interspaces, endplate hypertrophic changes, and endplate erosions. Most prominent at C4-5, C5-6, and C6-7 levels. Prominent degenerative changes at C1-2 as well. Degenerative changes in the cervical facet joints. Upper chest: Motion artifact limits examination. There is a right apical pneumothorax with subcutaneous emphysema in the right supraclavicular region extending up into the right side of the neck. Other: None. IMPRESSION: 1. No acute intracranial abnormalities. Chronic atrophy and small vessel ischemic changes. 2. Severe degenerative change throughout the cervical spine. No change in alignment since previous study. 3. No acute displaced fractures identified. 4. Incidental note of a right apical pneumothorax with subcutaneous emphysema in the right supraclavicular region and right neck. See additional report of CT chest. These results were called by telephone at the time of interpretation on 11/30/2017 at 6:55 am to Dr. Rush Farmer, who verbally acknowledged these results. Electronically Signed   By: Lucienne Capers M.D.   On: 12/29/2017 06:56   Ct Abdomen Pelvis W Contrast  Result Date: 12/08/2017 CLINICAL DATA:  Level 1 trauma. Fell at home. EXAM: CT CHEST, ABDOMEN, AND PELVIS WITH CONTRAST TECHNIQUE: Multidetector CT imaging of the chest, abdomen and pelvis was performed following the standard protocol during bolus administration of intravenous contrast. CONTRAST:  143mL ISOVUE-300 IOPAMIDOL  (ISOVUE-300) INJECTION 61% COMPARISON:  None. FINDINGS: CT CHEST FINDINGS Cardiovascular: Normal heart size. No pericardial effusion. Coronary  artery calcifications. Normal caliber thoracic aorta with scattered calcifications. Great vessel origins are patent. No mediastinal hematoma identified. Mediastinum/Nodes: Esophagus is mostly decompressed. Small amount of fluid and air in the esophagus without distention likely due to reflux or dysmotility. Possible small diverticulum of the cervical esophagus. Lungs/Pleura: Small to moderate right pneumothorax without tension. Small right pleural effusion. Basilar atelectasis or consolidation on the right. Patchy areas of infiltration in both upper lungs may be due to contusion or atelectasis. A right chest tube is present. Musculoskeletal: Extensive subcutaneous emphysema in the right chest and extending into the right side of the neck. Lesser amount of subcutaneous emphysema in the anterior left chest wall. Soft tissue emphysema extends into the right arm. Multiple displaced right rib fractures including the seventh, eighth, ninth, tenth ribs. Left ribs appear intact. Comminuted nondisplaced fractures of the medial right clavicular head. Sternum and thoracic spine appear intact. Bridging anterior osteophytes throughout the thoracic spine. CT ABDOMEN PELVIS FINDINGS Hepatobiliary: Multiple hepatic cysts versus dilated bile ducts. Gallbladder is distended without wall thickening. No stones identified. No bile duct dilatation. Cyst in the caudate lobe of the liver measures 6.6 cm diameter. No evidence of hepatic laceration or hematoma. Pancreas: Unremarkable. No pancreatic ductal dilatation or surrounding inflammatory changes. Spleen: No splenic injury or perisplenic hematoma. Adrenals/Urinary Tract: No adrenal hemorrhage or renal injury identified. Cyst in the lower pole right kidney. There is a Foley catheter present. What is presumably the bladder represents a 11.6 x 8.9  cm diameter area of soft tissue density. This could represent a large mass obliterating the bladder or heterogeneous hemorrhage. Delayed CT imaging or cystoscopy is suggested for further evaluation. No extraluminal hemorrhage is suggested. Stomach/Bowel: Stomach, small bowel, and colon are mostly decompressed with scattered stool in the colon. Diverticulosis of the colon without evidence of inflammatory change. No bowel wall thickening is appreciated although under distention limits evaluation. Appendix is not identified. Vascular/Lymphatic: Aortic atherosclerosis. No enlarged abdominal or pelvic lymph nodes. Reproductive: Prostate gland appears normal size although con newly with the abnormal appearing bladder is not excluded. Visualization is limited due to streak artifact from right hip arthroplasty. Other: No free air or free fluid in the abdomen. Soft tissue emphysema extends along the right lateral abdominal wall down to the pelvis and into the anterior pelvis and inguinal/scrotal regions. Musculoskeletal: Degenerative changes throughout the thoracic spine. Normal alignment. Right hip arthroplasty. Sacrum, pelvis, and hips appear intact. IMPRESSION: 1. Small to moderate right pneumothorax without tension. Right chest tube in place. Multiple displaced right rib fractures. Basilar atelectasis or consolidation on the right. Small right pleural effusion. No mediastinal hematoma. Probable pulmonary contusions. 2. Extensive subcutaneous emphysema in the right chest and abdomen extending into the right arm and down to the scrotum. No evidence of solid organ injury or bowel perforation. 3. Large area of soft tissue density corresponding to the bladder with Foley catheter inside. This could represent a large mass obliterating the bladder or heterogeneous hemorrhage within the bladder. Delayed CT imaging or cystoscopy is suggested for further evaluation. 4. Multiple hepatic cysts versus dilated bile ducts. 5. Aortic  atherosclerosis Emphysema (ICD10-J43.9). These results were called by telephone at the time of interpretation on 12/01/2017 at 7:07 am to Dr. Grandville Silos , who verbally acknowledged these results. Electronically Signed   By: Lucienne Capers M.D.   On: 12/10/2017 07:14   Dg Chest Portable 1 View  Result Date: 12/21/2017 CLINICAL DATA:  Initial evaluation status post chest tube placement. EXAM: PORTABLE CHEST 1 VIEW  COMPARISON:  Prior radiograph from earlier the same day. FINDINGS: Stable cardiomegaly. Mediastinal silhouette normal. Aortic atherosclerosis. No mediastinal shift. Interval placement of right-sided pigtail chest tube with tip overlying the right hilar region. Persistent right apical pneumothorax, similar to perhaps slightly decreased in size with pleural edge overlying the right third rib. Overall volume approximately 10-15%. Opacity within the right lung likely reflects atelectatic changes, congestion, and possibly small amount of hemorrhage. Left lung remains clear. Extensive emphysema again seen throughout the right chest wall. Acute fracture of the right lateral sixth, seventh, and likely eighth ribs. IMPRESSION: 1. Interval placement of right-sided pigtail chest tube with tip overlying the right hilum. Persistent right apical pneumothorax with pleural edge overlying the right third rib (approximately 10-15% in volume). No mediastinal shift. 2. Acute mildly displaced fractures of the right lateral sixth and seventh ribs, and possibly eighth rib. 3. Extensive soft tissue emphysema throughout the right chest wall. Electronically Signed   By: Jeannine Boga M.D.   On: 12/26/2017 06:32   Dg Chest Portable 1 View  Result Date: 12/01/2017 CLINICAL DATA:  Initial evaluation for acute trauma, flail chest. EXAM: PORTABLE CHEST 1 VIEW COMPARISON:  Prior radiograph from 04/04/2017. FINDINGS: Cardiac and mediastinal silhouettes are stable in size and contour, and remain within normal limits. Aortic  atherosclerosis. Lungs are hypoinflated. Right apical pneumothorax is visualized, pleural edge at the fourth intercostal airspace. Scattered opacity throughout the right lung likely reflects atelectatic changes and/or congestion. Mild scattered atelectatic changes at the left lung base. Left lung is otherwise clear. Extensive soft tissue emphysema throughout the right lateral chest wall. No appreciable displaced rib fracture on this limited single frontal view of the chest. IMPRESSION: 1. Acute right apical pneumothorax, pleural edge at the right fourth intercostal airspace. 2. Scattered opacity throughout the right lung, likely associated atelectasis and/or congestion. 3. Extensive soft tissue emphysema within the right lateral chest wall. Critical Value/emergent results were called by telephone at the time of interpretation on 12/23/2017 at 5:56 am to Dr. Joseph Berkshire , who verbally acknowledged these results. Electronically Signed   By: Jeannine Boga M.D.   On: 11/29/2017 05:56    Anti-infectives: Anti-infectives (From admission, onward)   None       Assessment/Plan Fall R rib fxs 7-10 with PNX and extensive subcutaneous emphysema -chest tube to -20 R clavicle FX Bladder outlet obstruction from bladder tumor -Foley H/o home hospice for COPD -this is a terminal event DNR  ID - none FEN - CLD Foley - in place  Plan - Palliative consult pending. Comfort care.   LOS: 1 day    Wellington Hampshire , [facility] December 23, 2017, 7:58 AM Pager: [phone] Consults: [phone] Mon 7:00 am -11:30 AM Tues-Fri 7:00 am-4:30 pm Sat-Sun 7:00 am-11:30 am

## 2017-12-30 NOTE — Death Summary Note (Signed)
[facility] Death Summary   Patient ID: Ronald Strong MRN: [id number] DOB/AGE: [birth year] 84 y.o.  Admit date: Dec 24, 2017 Date of death: 2017-12-25  Admitting Diagnosis: Fall R rib fxs 7-10 with PNX and extensive subcutaneous emphysema R clavicle FX Bladder outlet obstruction from bladder tumor H/o home hospice for COPD DNR  Discharge Diagnosis Patient Active Problem List   Diagnosis Date Noted  . Pneumothorax on right 24-Dec-2017    Consultants Palliative care  Imaging: Ct Head Wo Contrast  Result Date: 2017/12/24 CLINICAL DATA:  Level 1 trauma. Fell at home with chest deformity. EXAM: CT HEAD WITHOUT CONTRAST CT CERVICAL SPINE WITHOUT CONTRAST TECHNIQUE: Multidetector CT imaging of the head and cervical spine was performed following the standard protocol without intravenous contrast. Multiplanar CT image reconstructions of the cervical spine were also generated. COMPARISON:  04/04/2017 FINDINGS: CT HEAD FINDINGS Brain: Diffuse cerebral atrophy. Low-attenuation changes throughout the deep white matter consistent with small vessel ischemia. No mass-effect or midline shift. No abnormal extra-axial fluid collections. Gray-white matter junctions are distinct. Basal cisterns are not effaced. No acute intracranial hemorrhage. Vascular: Mild intracranial arterial vascular calcifications are present. Skull: Calvarium appears intact. Sinuses/Orbits: Paranasal sinuses and mastoid air cells are clear. Other: Soft tissue emphysema demonstrated in the right side of the neck. CT CERVICAL SPINE FINDINGS Alignment: Slight anterior subluxation of C7 on T1 and T1 on T2. Alignment is unchanged since previous study. Normal alignment of the facet joints. C1-2 articulation appears intact. Skull base and vertebrae: Skull base appears intact. Soft tissues and spinal canal: No vertebral compression deformities. No focal bone lesion or bone destruction. Disc levels: Severe degenerative changes  throughout the cervical spine with narrowed interspaces, endplate hypertrophic changes, and endplate erosions. Most prominent at C4-5, C5-6, and C6-7 levels. Prominent degenerative changes at C1-2 as well. Degenerative changes in the cervical facet joints. Upper chest: Motion artifact limits examination. There is a right apical pneumothorax with subcutaneous emphysema in the right supraclavicular region extending up into the right side of the neck. Other: None. IMPRESSION: 1. No acute intracranial abnormalities. Chronic atrophy and small vessel ischemic changes. 2. Severe degenerative change throughout the cervical spine. No change in alignment since previous study. 3. No acute displaced fractures identified. 4. Incidental note of a right apical pneumothorax with subcutaneous emphysema in the right supraclavicular region and right neck. See additional report of CT chest. These results were called by telephone at the time of interpretation on December 24, 2017 at 6:55 am to Dr. Rush Farmer, who verbally acknowledged these results. Electronically Signed   By: Lucienne Capers M.D.   On: 12-24-17 06:56   Ct Chest W Contrast  Result Date: 12-24-2017 CLINICAL DATA:  Level 1 trauma. Fell at home. EXAM: CT CHEST, ABDOMEN, AND PELVIS WITH CONTRAST TECHNIQUE: Multidetector CT imaging of the chest, abdomen and pelvis was performed following the standard protocol during bolus administration of intravenous contrast. CONTRAST:  14mL ISOVUE-300 IOPAMIDOL (ISOVUE-300) INJECTION 61% COMPARISON:  None. FINDINGS: CT CHEST FINDINGS Cardiovascular: Normal heart size. No pericardial effusion. Coronary artery calcifications. Normal caliber thoracic aorta with scattered calcifications. Great vessel origins are patent. No mediastinal hematoma identified. Mediastinum/Nodes: Esophagus is mostly decompressed. Small amount of fluid and air in the esophagus without distention likely due to reflux or dysmotility. Possible small diverticulum of the  cervical esophagus. Lungs/Pleura: Small to moderate right pneumothorax without tension. Small right pleural effusion. Basilar atelectasis or consolidation on the right. Patchy areas of infiltration in both upper lungs may be due to contusion or atelectasis. A  right chest tube is present. Musculoskeletal: Extensive subcutaneous emphysema in the right chest and extending into the right side of the neck. Lesser amount of subcutaneous emphysema in the anterior left chest wall. Soft tissue emphysema extends into the right arm. Multiple displaced right rib fractures including the seventh, eighth, ninth, tenth ribs. Left ribs appear intact. Comminuted nondisplaced fractures of the medial right clavicular head. Sternum and thoracic spine appear intact. Bridging anterior osteophytes throughout the thoracic spine. CT ABDOMEN PELVIS FINDINGS Hepatobiliary: Multiple hepatic cysts versus dilated bile ducts. Gallbladder is distended without wall thickening. No stones identified. No bile duct dilatation. Cyst in the caudate lobe of the liver measures 6.6 cm diameter. No evidence of hepatic laceration or hematoma. Pancreas: Unremarkable. No pancreatic ductal dilatation or surrounding inflammatory changes. Spleen: No splenic injury or perisplenic hematoma. Adrenals/Urinary Tract: No adrenal hemorrhage or renal injury identified. Cyst in the lower pole right kidney. There is a Foley catheter present. What is presumably the bladder represents a 11.6 x 8.9 cm diameter area of soft tissue density. This could represent a large mass obliterating the bladder or heterogeneous hemorrhage. Delayed CT imaging or cystoscopy is suggested for further evaluation. No extraluminal hemorrhage is suggested. Stomach/Bowel: Stomach, small bowel, and colon are mostly decompressed with scattered stool in the colon. Diverticulosis of the colon without evidence of inflammatory change. No bowel wall thickening is appreciated although under distention limits  evaluation. Appendix is not identified. Vascular/Lymphatic: Aortic atherosclerosis. No enlarged abdominal or pelvic lymph nodes. Reproductive: Prostate gland appears normal size although con newly with the abnormal appearing bladder is not excluded. Visualization is limited due to streak artifact from right hip arthroplasty. Other: No free air or free fluid in the abdomen. Soft tissue emphysema extends along the right lateral abdominal wall down to the pelvis and into the anterior pelvis and inguinal/scrotal regions. Musculoskeletal: Degenerative changes throughout the thoracic spine. Normal alignment. Right hip arthroplasty. Sacrum, pelvis, and hips appear intact. IMPRESSION: 1. Small to moderate right pneumothorax without tension. Right chest tube in place. Multiple displaced right rib fractures. Basilar atelectasis or consolidation on the right. Small right pleural effusion. No mediastinal hematoma. Probable pulmonary contusions. 2. Extensive subcutaneous emphysema in the right chest and abdomen extending into the right arm and down to the scrotum. No evidence of solid organ injury or bowel perforation. 3. Large area of soft tissue density corresponding to the bladder with Foley catheter inside. This could represent a large mass obliterating the bladder or heterogeneous hemorrhage within the bladder. Delayed CT imaging or cystoscopy is suggested for further evaluation. 4. Multiple hepatic cysts versus dilated bile ducts. 5. Aortic atherosclerosis Emphysema (ICD10-J43.9). These results were called by telephone at the time of interpretation on 12/04/2017 at 7:07 am to Dr. Grandville Silos , who verbally acknowledged these results. Electronically Signed   By: Lucienne Capers M.D.   On: 12/28/2017 07:14   Ct Cervical Spine Wo Contrast  Result Date: 12/01/2017 CLINICAL DATA:  Level 1 trauma. Fell at home with chest deformity. EXAM: CT HEAD WITHOUT CONTRAST CT CERVICAL SPINE WITHOUT CONTRAST TECHNIQUE: Multidetector CT  imaging of the head and cervical spine was performed following the standard protocol without intravenous contrast. Multiplanar CT image reconstructions of the cervical spine were also generated. COMPARISON:  04/04/2017 FINDINGS: CT HEAD FINDINGS Brain: Diffuse cerebral atrophy. Low-attenuation changes throughout the deep white matter consistent with small vessel ischemia. No mass-effect or midline shift. No abnormal extra-axial fluid collections. Gray-white matter junctions are distinct. Basal cisterns are not effaced. No acute  intracranial hemorrhage. Vascular: Mild intracranial arterial vascular calcifications are present. Skull: Calvarium appears intact. Sinuses/Orbits: Paranasal sinuses and mastoid air cells are clear. Other: Soft tissue emphysema demonstrated in the right side of the neck. CT CERVICAL SPINE FINDINGS Alignment: Slight anterior subluxation of C7 on T1 and T1 on T2. Alignment is unchanged since previous study. Normal alignment of the facet joints. C1-2 articulation appears intact. Skull base and vertebrae: Skull base appears intact. Soft tissues and spinal canal: No vertebral compression deformities. No focal bone lesion or bone destruction. Disc levels: Severe degenerative changes throughout the cervical spine with narrowed interspaces, endplate hypertrophic changes, and endplate erosions. Most prominent at C4-5, C5-6, and C6-7 levels. Prominent degenerative changes at C1-2 as well. Degenerative changes in the cervical facet joints. Upper chest: Motion artifact limits examination. There is a right apical pneumothorax with subcutaneous emphysema in the right supraclavicular region extending up into the right side of the neck. Other: None. IMPRESSION: 1. No acute intracranial abnormalities. Chronic atrophy and small vessel ischemic changes. 2. Severe degenerative change throughout the cervical spine. No change in alignment since previous study. 3. No acute displaced fractures identified. 4.  Incidental note of a right apical pneumothorax with subcutaneous emphysema in the right supraclavicular region and right neck. See additional report of CT chest. These results were called by telephone at the time of interpretation on 12/13/2017 at 6:55 am to Dr. Rush Farmer, who verbally acknowledged these results. Electronically Signed   By: Lucienne Capers M.D.   On: 12/22/2017 06:56   Ct Abdomen Pelvis W Contrast  Result Date: 12/04/2017 CLINICAL DATA:  Level 1 trauma. Fell at home. EXAM: CT CHEST, ABDOMEN, AND PELVIS WITH CONTRAST TECHNIQUE: Multidetector CT imaging of the chest, abdomen and pelvis was performed following the standard protocol during bolus administration of intravenous contrast. CONTRAST:  16mL ISOVUE-300 IOPAMIDOL (ISOVUE-300) INJECTION 61% COMPARISON:  None. FINDINGS: CT CHEST FINDINGS Cardiovascular: Normal heart size. No pericardial effusion. Coronary artery calcifications. Normal caliber thoracic aorta with scattered calcifications. Great vessel origins are patent. No mediastinal hematoma identified. Mediastinum/Nodes: Esophagus is mostly decompressed. Small amount of fluid and air in the esophagus without distention likely due to reflux or dysmotility. Possible small diverticulum of the cervical esophagus. Lungs/Pleura: Small to moderate right pneumothorax without tension. Small right pleural effusion. Basilar atelectasis or consolidation on the right. Patchy areas of infiltration in both upper lungs may be due to contusion or atelectasis. A right chest tube is present. Musculoskeletal: Extensive subcutaneous emphysema in the right chest and extending into the right side of the neck. Lesser amount of subcutaneous emphysema in the anterior left chest wall. Soft tissue emphysema extends into the right arm. Multiple displaced right rib fractures including the seventh, eighth, ninth, tenth ribs. Left ribs appear intact. Comminuted nondisplaced fractures of the medial right clavicular head.  Sternum and thoracic spine appear intact. Bridging anterior osteophytes throughout the thoracic spine. CT ABDOMEN PELVIS FINDINGS Hepatobiliary: Multiple hepatic cysts versus dilated bile ducts. Gallbladder is distended without wall thickening. No stones identified. No bile duct dilatation. Cyst in the caudate lobe of the liver measures 6.6 cm diameter. No evidence of hepatic laceration or hematoma. Pancreas: Unremarkable. No pancreatic ductal dilatation or surrounding inflammatory changes. Spleen: No splenic injury or perisplenic hematoma. Adrenals/Urinary Tract: No adrenal hemorrhage or renal injury identified. Cyst in the lower pole right kidney. There is a Foley catheter present. What is presumably the bladder represents a 11.6 x 8.9 cm diameter area of soft tissue density. This could represent a large  mass obliterating the bladder or heterogeneous hemorrhage. Delayed CT imaging or cystoscopy is suggested for further evaluation. No extraluminal hemorrhage is suggested. Stomach/Bowel: Stomach, small bowel, and colon are mostly decompressed with scattered stool in the colon. Diverticulosis of the colon without evidence of inflammatory change. No bowel wall thickening is appreciated although under distention limits evaluation. Appendix is not identified. Vascular/Lymphatic: Aortic atherosclerosis. No enlarged abdominal or pelvic lymph nodes. Reproductive: Prostate gland appears normal size although con newly with the abnormal appearing bladder is not excluded. Visualization is limited due to streak artifact from right hip arthroplasty. Other: No free air or free fluid in the abdomen. Soft tissue emphysema extends along the right lateral abdominal wall down to the pelvis and into the anterior pelvis and inguinal/scrotal regions. Musculoskeletal: Degenerative changes throughout the thoracic spine. Normal alignment. Right hip arthroplasty. Sacrum, pelvis, and hips appear intact. IMPRESSION: 1. Small to moderate right  pneumothorax without tension. Right chest tube in place. Multiple displaced right rib fractures. Basilar atelectasis or consolidation on the right. Small right pleural effusion. No mediastinal hematoma. Probable pulmonary contusions. 2. Extensive subcutaneous emphysema in the right chest and abdomen extending into the right arm and down to the scrotum. No evidence of solid organ injury or bowel perforation. 3. Large area of soft tissue density corresponding to the bladder with Foley catheter inside. This could represent a large mass obliterating the bladder or heterogeneous hemorrhage within the bladder. Delayed CT imaging or cystoscopy is suggested for further evaluation. 4. Multiple hepatic cysts versus dilated bile ducts. 5. Aortic atherosclerosis Emphysema (ICD10-J43.9). These results were called by telephone at the time of interpretation on 12/28/2017 at 7:07 am to Dr. Grandville Silos , who verbally acknowledged these results. Electronically Signed   By: Lucienne Capers M.D.   On: 12/14/2017 07:14   Dg Chest Portable 1 View  Result Date: 12/04/2017 CLINICAL DATA:  Initial evaluation status post chest tube placement. EXAM: PORTABLE CHEST 1 VIEW COMPARISON:  Prior radiograph from earlier the same day. FINDINGS: Stable cardiomegaly. Mediastinal silhouette normal. Aortic atherosclerosis. No mediastinal shift. Interval placement of right-sided pigtail chest tube with tip overlying the right hilar region. Persistent right apical pneumothorax, similar to perhaps slightly decreased in size with pleural edge overlying the right third rib. Overall volume approximately 10-15%. Opacity within the right lung likely reflects atelectatic changes, congestion, and possibly small amount of hemorrhage. Left lung remains clear. Extensive emphysema again seen throughout the right chest wall. Acute fracture of the right lateral sixth, seventh, and likely eighth ribs. IMPRESSION: 1. Interval placement of right-sided pigtail chest tube  with tip overlying the right hilum. Persistent right apical pneumothorax with pleural edge overlying the right third rib (approximately 10-15% in volume). No mediastinal shift. 2. Acute mildly displaced fractures of the right lateral sixth and seventh ribs, and possibly eighth rib. 3. Extensive soft tissue emphysema throughout the right chest wall. Electronically Signed   By: Jeannine Boga M.D.   On: 12/25/2017 06:32   Dg Chest Portable 1 View  Result Date: 12/17/2017 CLINICAL DATA:  Initial evaluation for acute trauma, flail chest. EXAM: PORTABLE CHEST 1 VIEW COMPARISON:  Prior radiograph from 04/04/2017. FINDINGS: Cardiac and mediastinal silhouettes are stable in size and contour, and remain within normal limits. Aortic atherosclerosis. Lungs are hypoinflated. Right apical pneumothorax is visualized, pleural edge at the fourth intercostal airspace. Scattered opacity throughout the right lung likely reflects atelectatic changes and/or congestion. Mild scattered atelectatic changes at the left lung base. Left lung is otherwise clear. Extensive soft  tissue emphysema throughout the right lateral chest wall. No appreciable displaced rib fracture on this limited single frontal view of the chest. IMPRESSION: 1. Acute right apical pneumothorax, pleural edge at the right fourth intercostal airspace. 2. Scattered opacity throughout the right lung, likely associated atelectasis and/or congestion. 3. Extensive soft tissue emphysema within the right lateral chest wall. Critical Value/emergent results were called by telephone at the time of interpretation on 12/21/2017 at 5:56 am to Dr. Joseph Berkshire , who verbally acknowledged these results. Electronically Signed   By: Jeannine Boga M.D.   On: 12/20/2017 05:56    Procedures Dr. Ninfa Linden (12/05/2017) - Chest tube placement  Hospital Course:  Ronald Strong is a 84yo male PMH COPD on home hospice who presented to [facility] 8/19 after suffering a ground level  fall. Patient got up and fell striking his right side.  No loss of consciousness.  He was transported as a level 2 trauma but immediately upgraded due to subcutaneous emphysema on arrival.  An emergent chest tube was placed and he underwent further evaluation with CAT scans. Workup showed right rib fractures 7-10 with PNX and extensive subcutaneous emphysema, and right clavicle fracture.  Goals of care were discussed with patient's family and they decided to proceed with comfort care. Patient was admitted. Palliative care was consulted and he passed away peacefully on 2017/12/22.    Signed: Wellington Hampshire, [facility] Surgery 2017-12-22, 9:41 AM Pager: [phone] Consults: [phone] Mon 7:00 am -11:30 AM Tues-Fri 7:00 am-4:30 pm Sat-Sun 7:00 am-11:30 am

## 2017-12-30 NOTE — Progress Notes (Signed)
Palliative Medicine RN Note: AM symptom check. Pt is no longer responsive. Resp rate around 6. He was on NRB when I arrived. Family is laughing and telling stories at the bedside and engaging in life review.  We discussed symptom management; he is breathing harder with each breath even though they are less frequent. He has gotten several doses of Dilaudid overnight. I spoke with Dr Hilma Favors. She ordered increase to 1mg  every 2 hours prn for hydromorphone. Family also requested d/c NRB and allow him to die without a mask on, so I removed it as ordered yesterday, and he is now on RA.  I spoke with RN Leatrice Jewels; ensured that she and family have my contact information. I will also call ME on call to see if he will be an ME case d/t fall.  Ronald Skiff Nicosha Struve, RN, BSN, [facility] Palliative Medicine Team 12-29-2017 8:41 AM Office [phone]

## 2017-12-30 DEATH — deceased

## 2019-11-17 IMAGING — CR DG HIP (WITH OR WITHOUT PELVIS) 2-3V*R*
3 series · 3 of 3 positions shown · non-contrast
Comparison: None.

CLINICAL DATA: Fell on right side. Pain and limited range of
motion. Injury today.

EXAM:
DG HIP (WITH OR WITHOUT PELVIS) 2-3V RIGHT

[t pelvis ap]
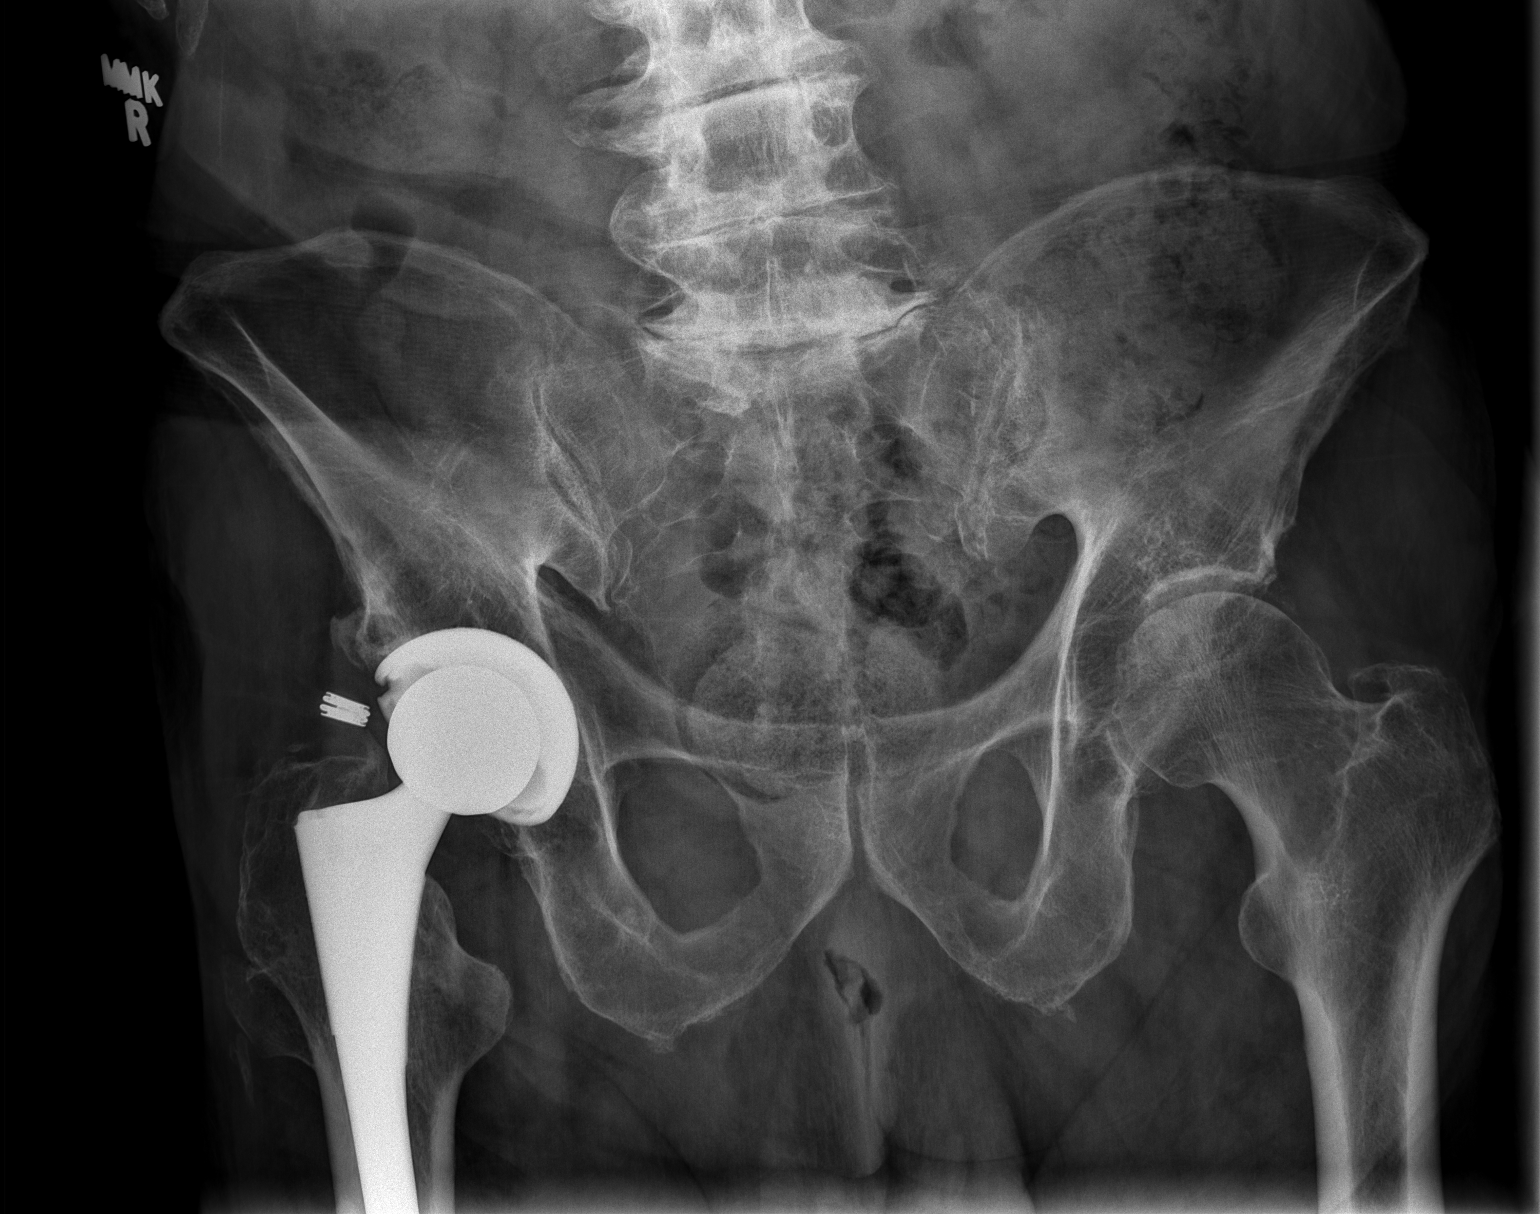

[t hip ap right]
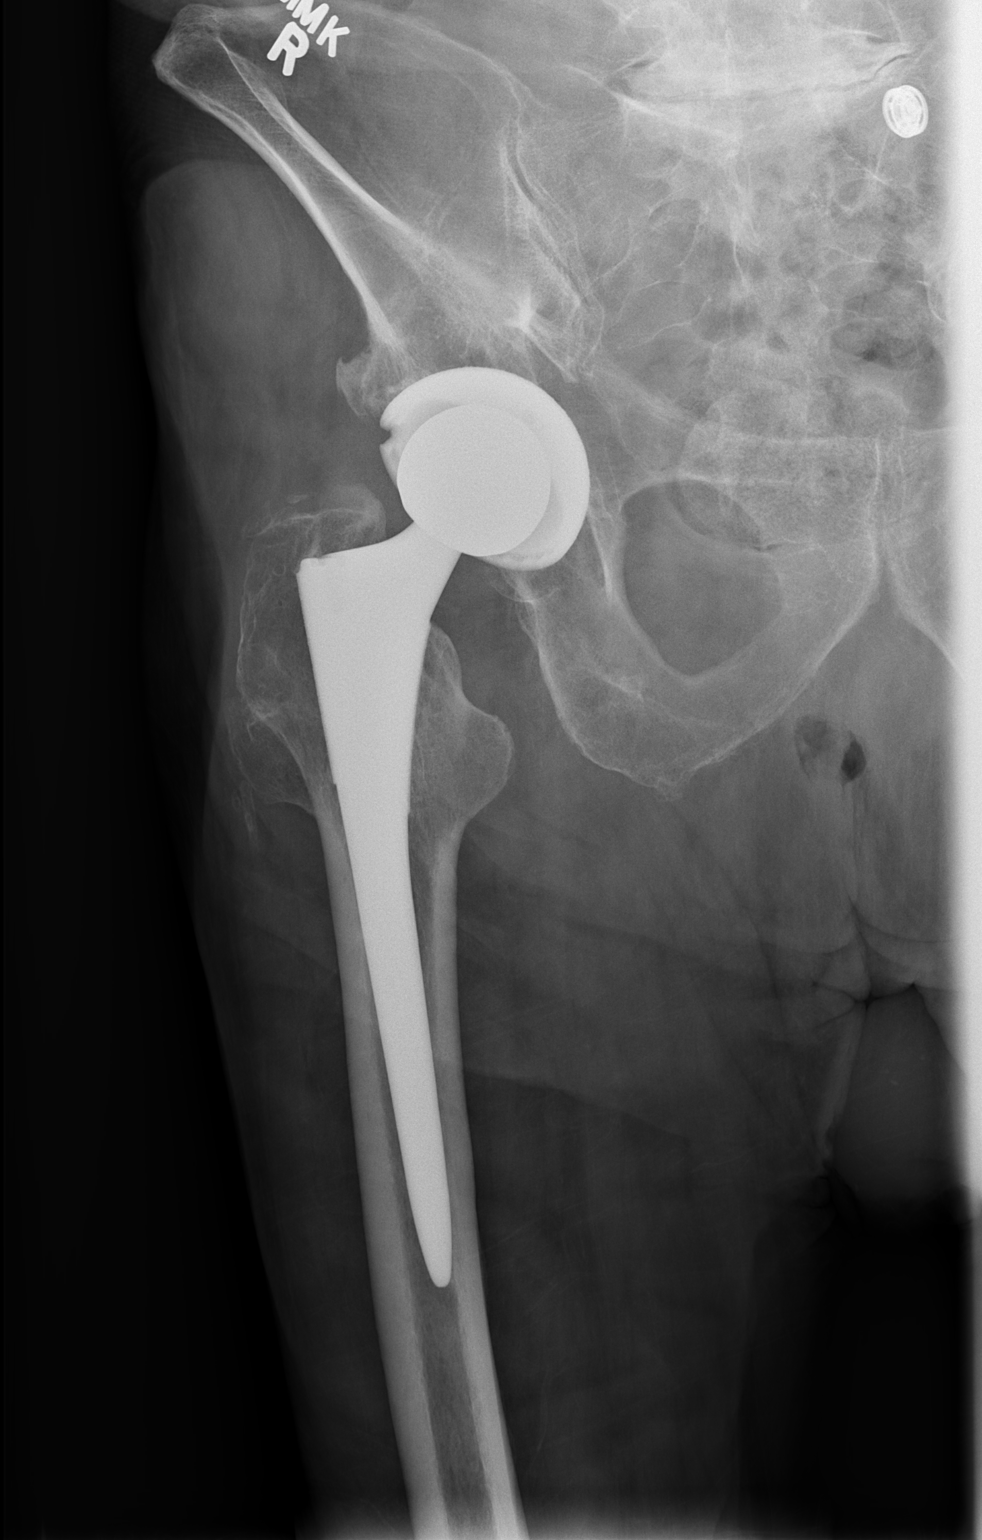

[t hip frog leg right]
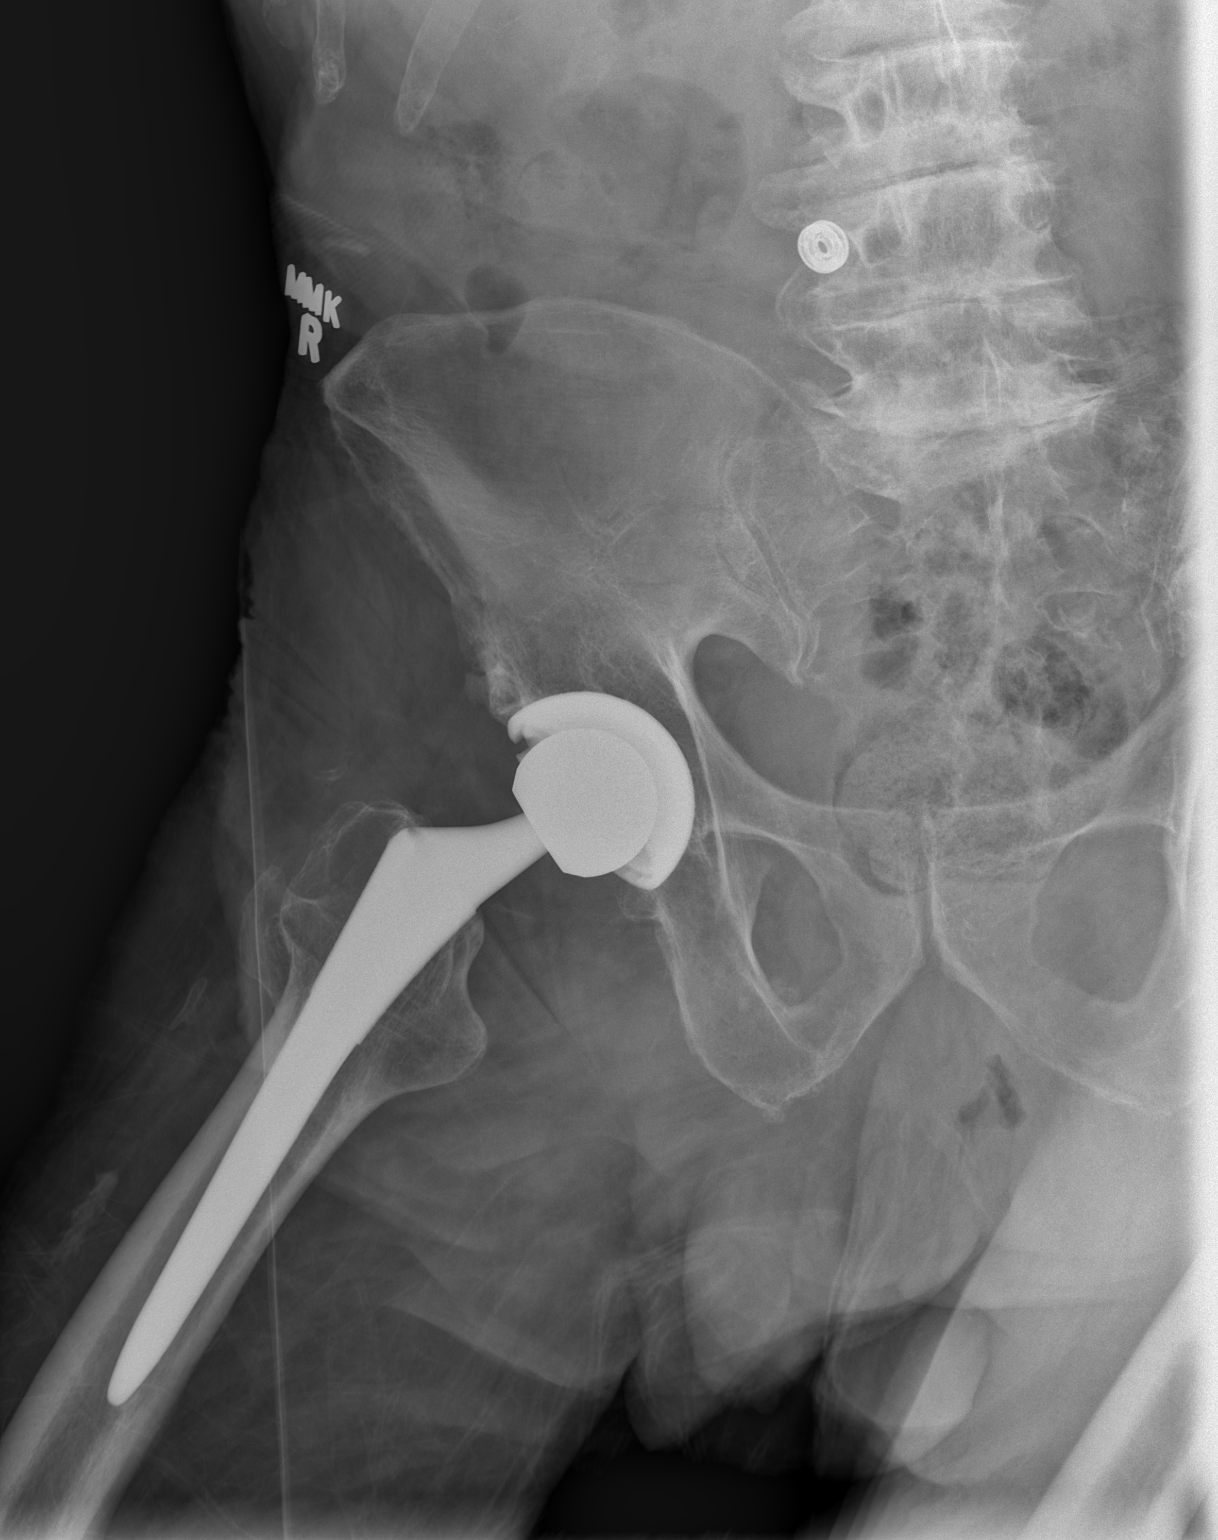

[3 of 3 positions shown; findings below may reference images not displayed]

FINDINGS: Previous total hip replacement on the right. No sign of fracture or
of postoperative complication. Other bones of the pelvis. Negative
other than some sacroiliac osteoarthritis and left hip
osteoarthritis.
IMPRESSION: No acute traumatic finding.  Previous hip replacement on the right.

## 2019-11-17 IMAGING — CR DG SHOULDER 2+V*R*
3 series · 3 of 3 positions shown · non-contrast
Comparison: Chest radiographs 12/12/2016 and chest CT 12/11/2016.

CLINICAL DATA: [AGE] male status post fall onto right side
with pain. Wheezing.

EXAM:
RIGHT SHOULDER - 2+ VIEW

[t shoulder internal right]
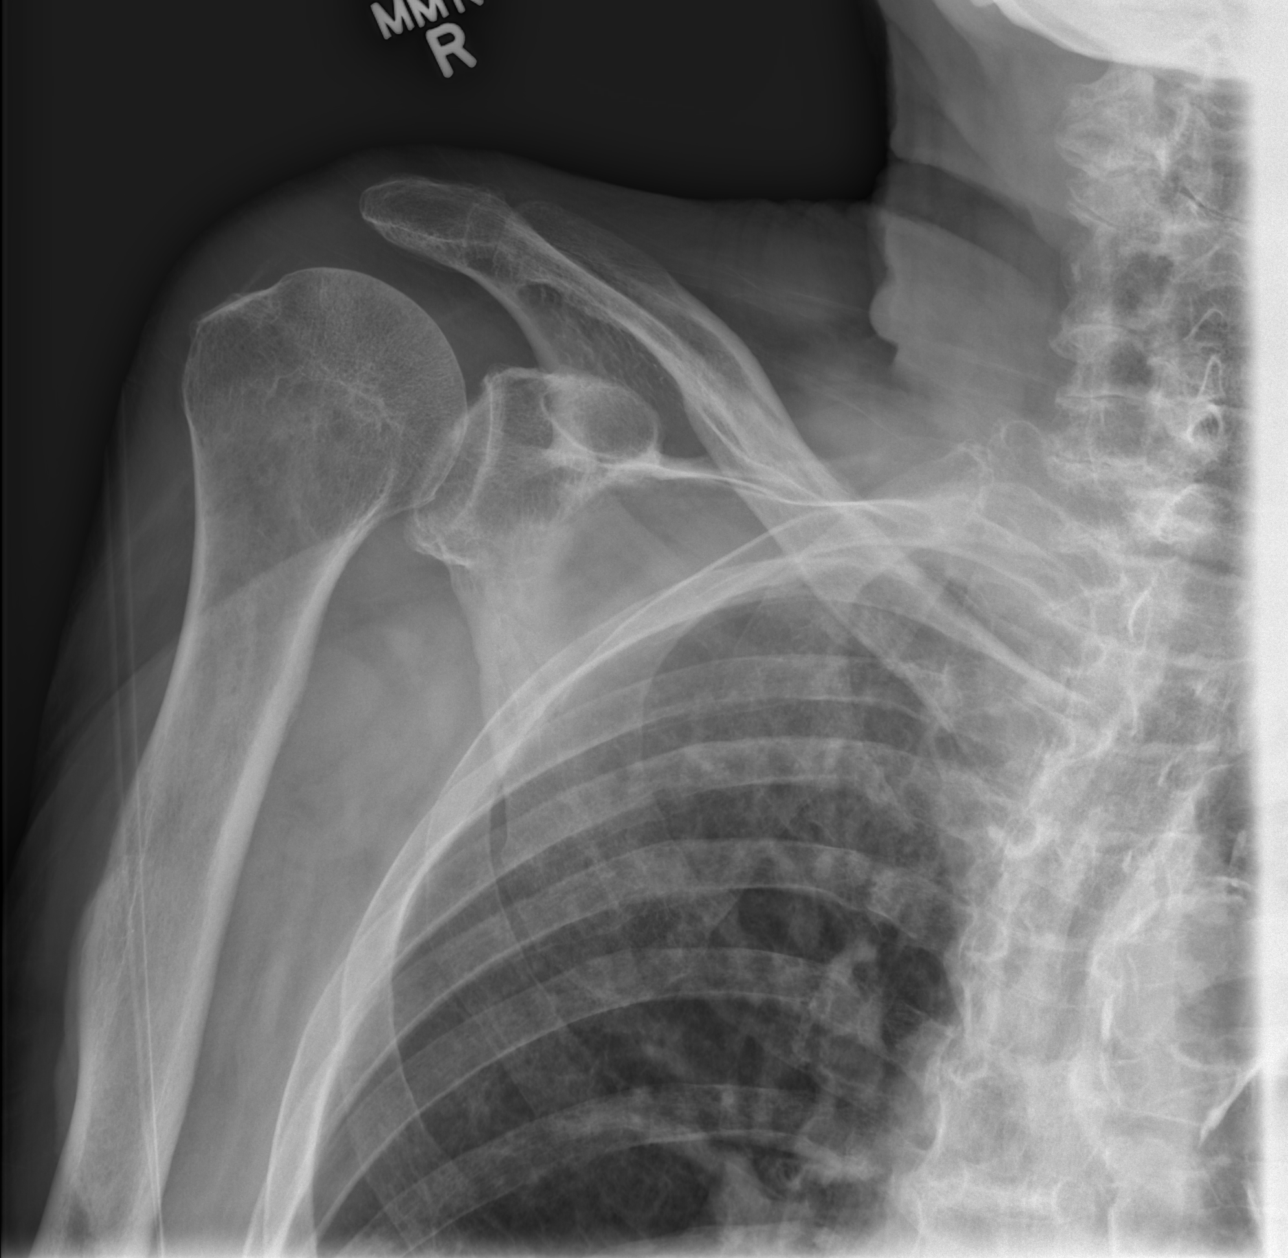

[t shoulder y-view right (1 of 2)]
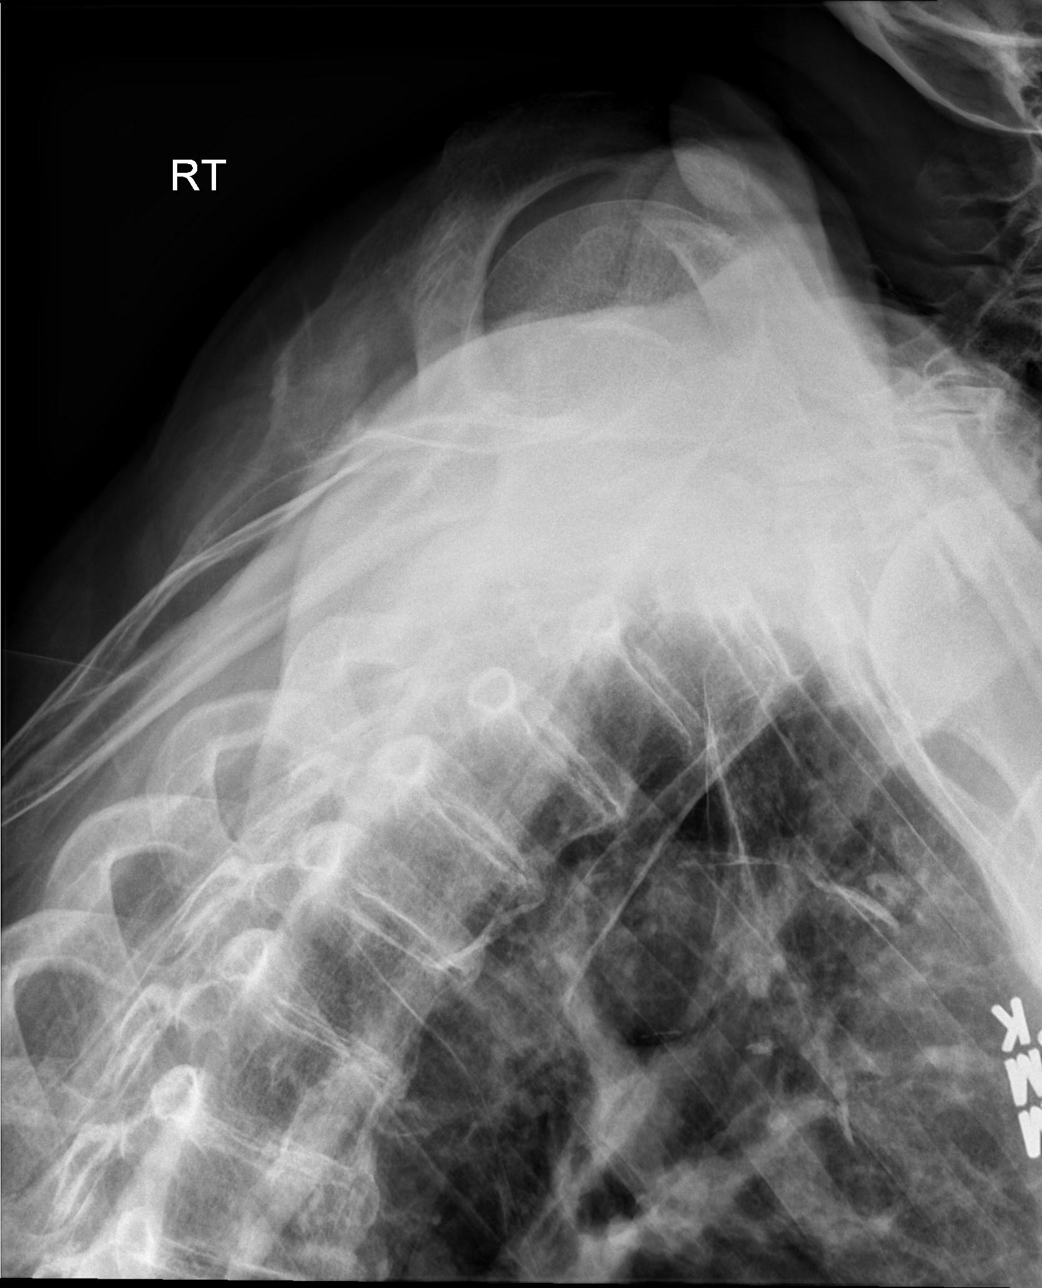

[t shoulder y-view right (2 of 2)]
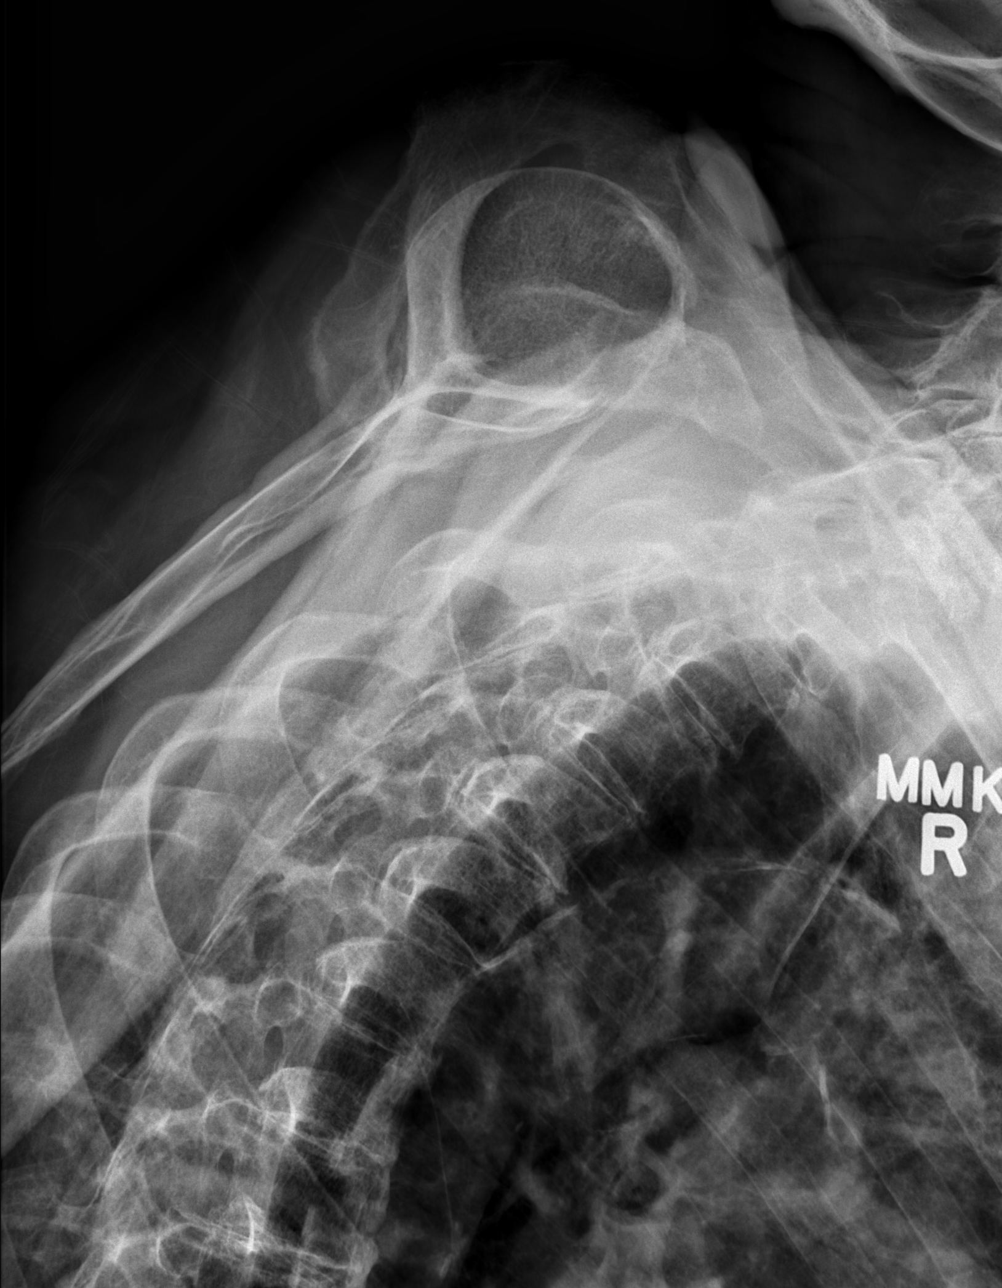

[3 of 3 positions shown; findings below may reference images not displayed]

FINDINGS: No glenohumeral joint dislocation. Intact proximal right humerus.
The right clavicle and scapula appear intact. Visible right ribs
appear intact. Stable visible lung parenchyma.
IMPRESSION: No acute fracture or dislocation identified about the right
shoulder.

## 2019-11-17 IMAGING — CR DG CHEST 2V
2 series · 2 of 2 positions shown · non-contrast
Comparison: Chest radiographs 12/13/2016 and earlier.

CLINICAL DATA: [AGE] male status post fall onto right side
with pain. Wheezing.

EXAM:
CHEST  2 VIEW

[w chest lat]
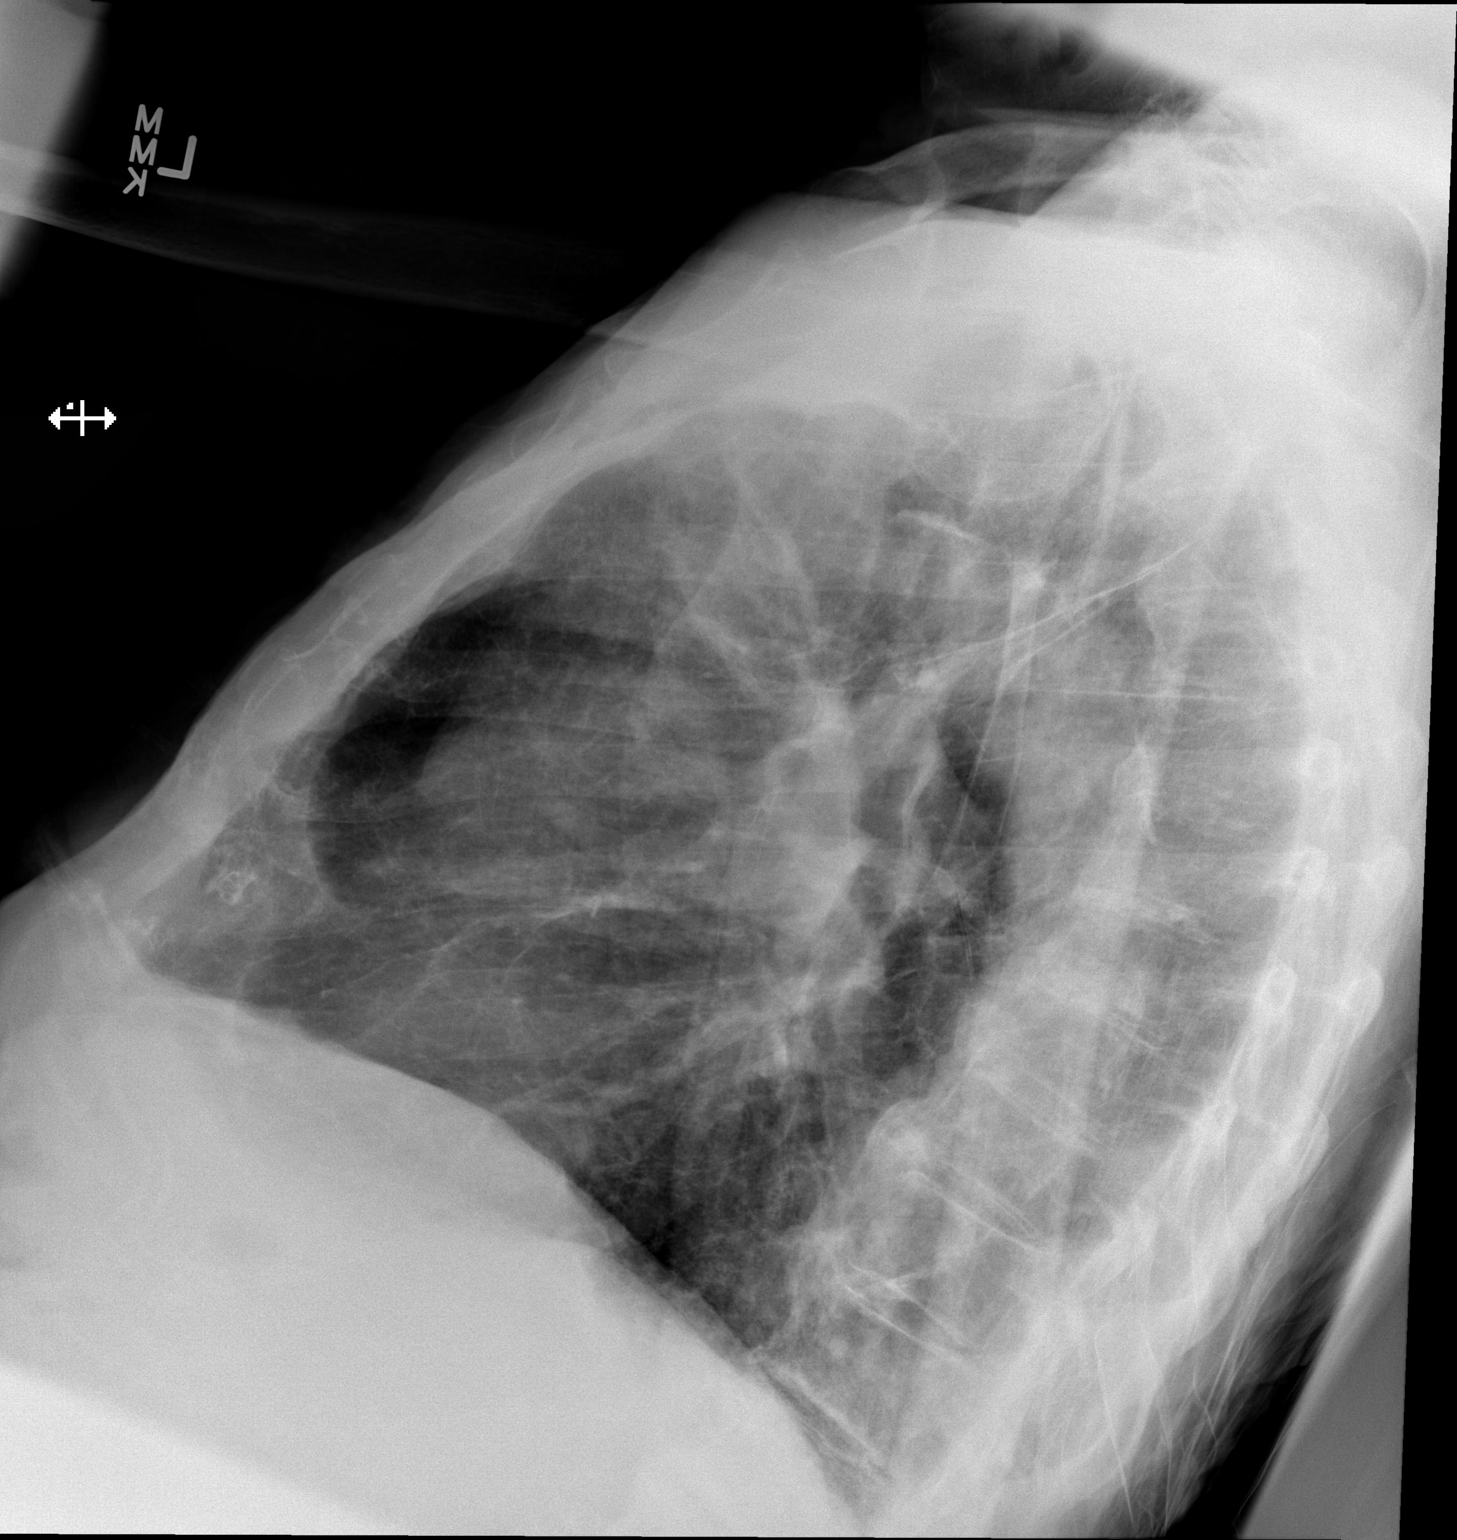

[x chest ap]
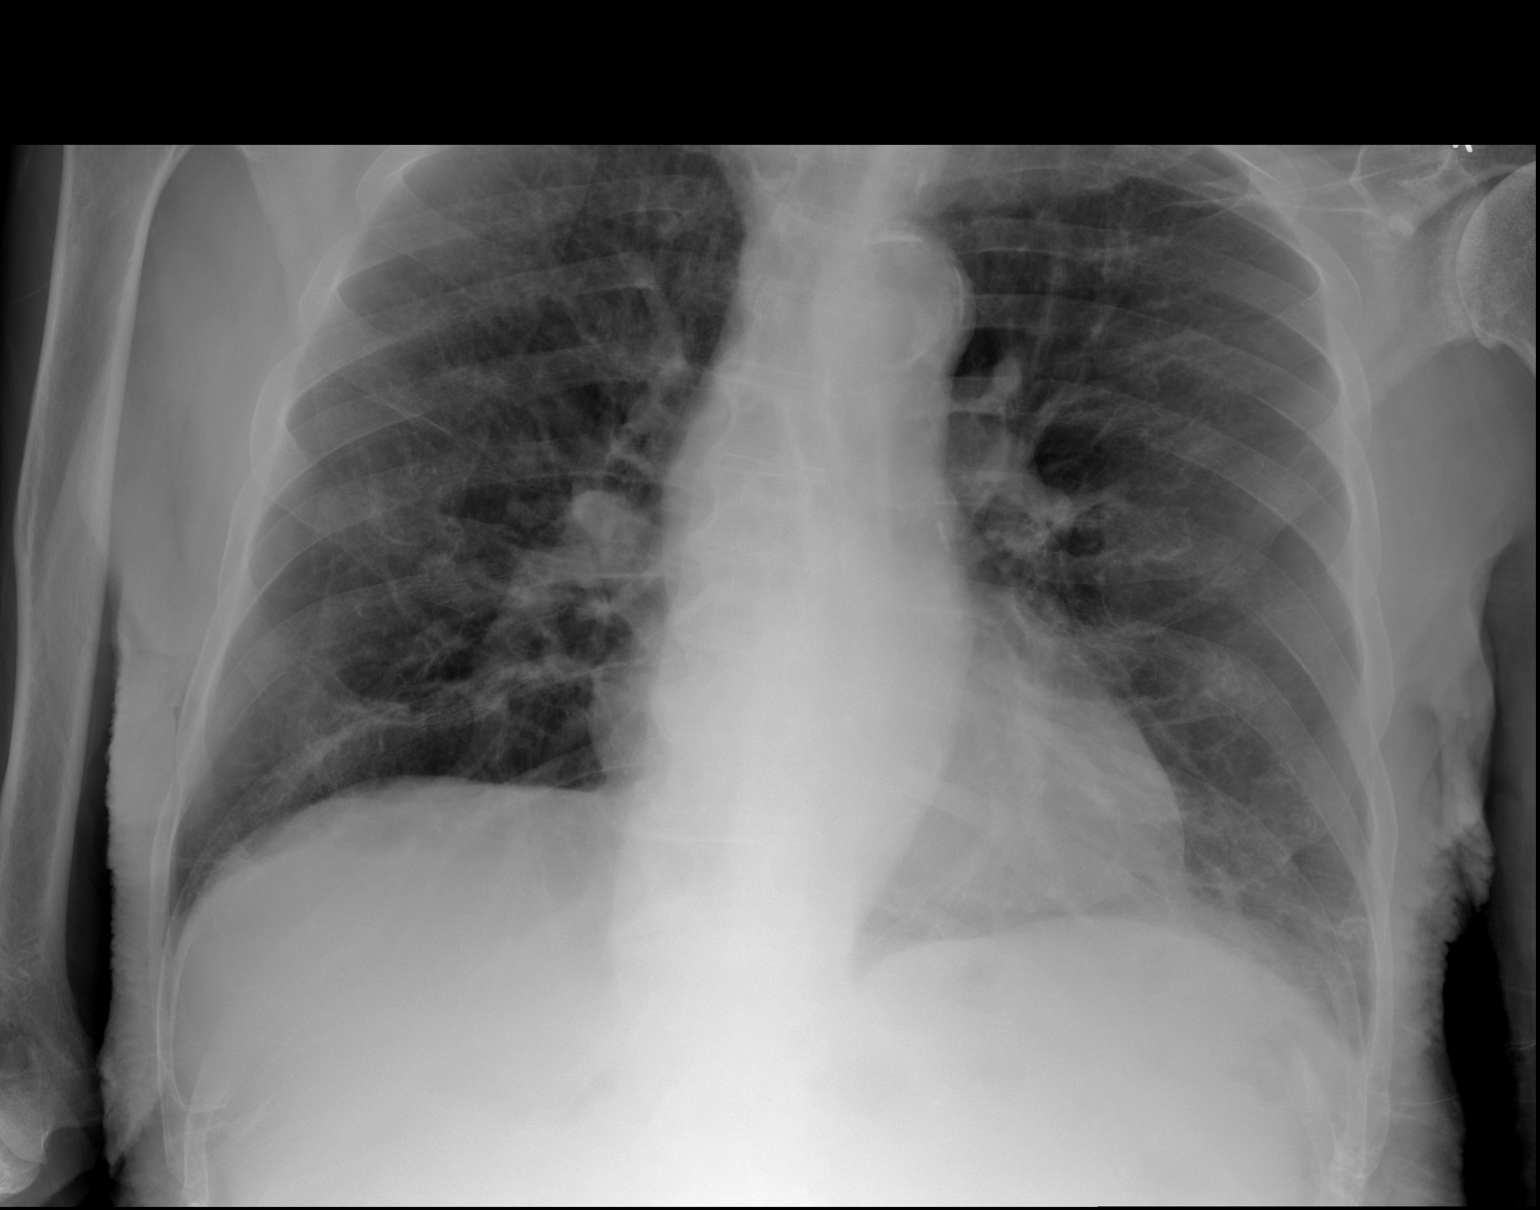

[2 of 2 positions shown; findings below may reference images not displayed]

FINDINGS: Semi upright AP and lateral views of the chest. Stable lung volumes.
Stable cardiac size and mediastinal contours. Calcified aortic
atherosclerosis. Visualized tracheal air column is within normal
limits. No pneumothorax, pulmonary edema or pleural effusion.
Basilar predominant reticulonodular opacity re- demonstrated at the
lung bases an greater on the left. No acute pulmonary opacity
identified. Stable visualized osseous structures.
IMPRESSION: 1. No acute cardiopulmonary abnormality or acute traumatic injury
identified.
2. Chronic basilar interstitial lung disease.
3.  Aortic Atherosclerosis ([id number]).
